# Patient Record
Sex: Female | Born: 1945 | Race: White | Hispanic: No | Marital: Married | Smoking: Former smoker
Health system: Southern US, Community
[De-identification: ages and names within clinical notes are randomized; demographics above are authoritative.]

## PROBLEM LIST (undated history)

## (undated) DIAGNOSIS — Z9889 Other specified postprocedural states: Secondary | ICD-10-CM

## (undated) DIAGNOSIS — E538 Deficiency of other specified B group vitamins: Secondary | ICD-10-CM

## (undated) DIAGNOSIS — Z8619 Personal history of other infectious and parasitic diseases: Secondary | ICD-10-CM

## (undated) DIAGNOSIS — J45909 Unspecified asthma, uncomplicated: Secondary | ICD-10-CM

## (undated) DIAGNOSIS — R7303 Prediabetes: Secondary | ICD-10-CM

## (undated) DIAGNOSIS — E559 Vitamin D deficiency, unspecified: Secondary | ICD-10-CM

## (undated) DIAGNOSIS — K219 Gastro-esophageal reflux disease without esophagitis: Secondary | ICD-10-CM

## (undated) DIAGNOSIS — D649 Anemia, unspecified: Secondary | ICD-10-CM

## (undated) DIAGNOSIS — H209 Unspecified iridocyclitis: Secondary | ICD-10-CM

## (undated) DIAGNOSIS — E039 Hypothyroidism, unspecified: Secondary | ICD-10-CM

## (undated) DIAGNOSIS — J449 Chronic obstructive pulmonary disease, unspecified: Secondary | ICD-10-CM

## (undated) DIAGNOSIS — E785 Hyperlipidemia, unspecified: Secondary | ICD-10-CM

## (undated) DIAGNOSIS — M5030 Other cervical disc degeneration, unspecified cervical region: Secondary | ICD-10-CM

## (undated) DIAGNOSIS — I10 Essential (primary) hypertension: Secondary | ICD-10-CM

## (undated) DIAGNOSIS — F419 Anxiety disorder, unspecified: Secondary | ICD-10-CM

## (undated) DIAGNOSIS — H8109 Meniere's disease, unspecified ear: Secondary | ICD-10-CM

## (undated) DIAGNOSIS — L570 Actinic keratosis: Secondary | ICD-10-CM

## (undated) DIAGNOSIS — C801 Malignant (primary) neoplasm, unspecified: Secondary | ICD-10-CM

## (undated) DIAGNOSIS — J9811 Atelectasis: Secondary | ICD-10-CM

## (undated) DIAGNOSIS — I499 Cardiac arrhythmia, unspecified: Secondary | ICD-10-CM

## (undated) DIAGNOSIS — R112 Nausea with vomiting, unspecified: Secondary | ICD-10-CM

## (undated) HISTORY — PX: ABDOMINAL HYSTERECTOMY: SHX81

## (undated) HISTORY — PX: MASTECTOMY: SHX3

## (undated) HISTORY — PX: BACK SURGERY: SHX140

## (undated) HISTORY — DX: Actinic keratosis: L57.0

## (undated) HISTORY — PX: TONSILLECTOMY: SUR1361

## (undated) HISTORY — PX: CHOLECYSTECTOMY: SHX55

## (undated) HISTORY — PX: BREAST CYST ASPIRATION: SHX578

## (undated) HISTORY — PX: LAMINECTOMY AND MICRODISCECTOMY LUMBAR SPINE: SHX1913

## (undated) HISTORY — PX: PILONIDAL CYST DRAINAGE: SHX743

---

## 2004-09-11 ENCOUNTER — Ambulatory Visit: Payer: Self-pay | Admitting: General Surgery

## 2005-09-27 ENCOUNTER — Ambulatory Visit: Payer: Self-pay

## 2005-10-05 ENCOUNTER — Ambulatory Visit: Payer: Self-pay | Admitting: General Surgery

## 2005-10-23 ENCOUNTER — Ambulatory Visit: Payer: Self-pay | Admitting: Obstetrics and Gynecology

## 2005-10-26 ENCOUNTER — Ambulatory Visit: Payer: Self-pay | Admitting: Obstetrics and Gynecology

## 2006-04-29 ENCOUNTER — Ambulatory Visit: Payer: Self-pay | Admitting: General Surgery

## 2006-06-08 ENCOUNTER — Ambulatory Visit: Payer: Self-pay | Admitting: Unknown Physician Specialty

## 2006-07-08 ENCOUNTER — Other Ambulatory Visit: Payer: Self-pay

## 2006-07-29 ENCOUNTER — Inpatient Hospital Stay: Payer: Self-pay | Admitting: General Practice

## 2006-09-09 ENCOUNTER — Ambulatory Visit: Payer: Self-pay | Admitting: General Practice

## 2006-10-08 ENCOUNTER — Ambulatory Visit: Payer: Self-pay | Admitting: Internal Medicine

## 2006-10-19 ENCOUNTER — Ambulatory Visit: Payer: Self-pay | Admitting: Internal Medicine

## 2006-12-02 ENCOUNTER — Ambulatory Visit: Payer: Self-pay | Admitting: Oncology

## 2006-12-20 ENCOUNTER — Ambulatory Visit: Payer: Self-pay | Admitting: Oncology

## 2007-01-18 ENCOUNTER — Ambulatory Visit: Payer: Self-pay | Admitting: Internal Medicine

## 2007-01-21 ENCOUNTER — Ambulatory Visit: Payer: Self-pay | Admitting: Internal Medicine

## 2007-02-18 ENCOUNTER — Ambulatory Visit: Payer: Self-pay | Admitting: Internal Medicine

## 2007-03-26 ENCOUNTER — Ambulatory Visit: Payer: Self-pay | Admitting: General Surgery

## 2007-04-20 ENCOUNTER — Ambulatory Visit: Payer: Self-pay | Admitting: Internal Medicine

## 2007-04-22 ENCOUNTER — Ambulatory Visit: Payer: Self-pay | Admitting: Internal Medicine

## 2007-04-24 ENCOUNTER — Ambulatory Visit: Payer: Self-pay | Admitting: Unknown Physician Specialty

## 2007-05-17 ENCOUNTER — Ambulatory Visit: Payer: Self-pay | Admitting: Unknown Physician Specialty

## 2007-05-20 ENCOUNTER — Ambulatory Visit: Payer: Self-pay | Admitting: Internal Medicine

## 2007-05-27 ENCOUNTER — Ambulatory Visit: Payer: Self-pay | Admitting: Internal Medicine

## 2007-06-20 ENCOUNTER — Ambulatory Visit: Payer: Self-pay | Admitting: Internal Medicine

## 2007-07-16 ENCOUNTER — Ambulatory Visit: Payer: Self-pay | Admitting: Internal Medicine

## 2007-07-21 ENCOUNTER — Ambulatory Visit: Payer: Self-pay | Admitting: Internal Medicine

## 2007-09-11 ENCOUNTER — Ambulatory Visit: Payer: Self-pay | Admitting: Internal Medicine

## 2007-09-20 ENCOUNTER — Ambulatory Visit: Payer: Self-pay | Admitting: Internal Medicine

## 2007-10-20 ENCOUNTER — Ambulatory Visit: Payer: Self-pay | Admitting: Internal Medicine

## 2007-12-21 ENCOUNTER — Ambulatory Visit: Payer: Self-pay | Admitting: Internal Medicine

## 2008-01-08 ENCOUNTER — Ambulatory Visit: Payer: Self-pay | Admitting: Internal Medicine

## 2008-01-18 ENCOUNTER — Ambulatory Visit: Payer: Self-pay | Admitting: Internal Medicine

## 2008-02-18 ENCOUNTER — Ambulatory Visit: Payer: Self-pay | Admitting: Internal Medicine

## 2008-03-19 ENCOUNTER — Ambulatory Visit: Payer: Self-pay | Admitting: Internal Medicine

## 2008-03-30 ENCOUNTER — Ambulatory Visit: Payer: Self-pay | Admitting: General Surgery

## 2008-04-02 ENCOUNTER — Ambulatory Visit: Payer: Self-pay | Admitting: Internal Medicine

## 2008-04-19 ENCOUNTER — Ambulatory Visit: Payer: Self-pay | Admitting: Internal Medicine

## 2008-06-19 ENCOUNTER — Ambulatory Visit: Payer: Self-pay | Admitting: Internal Medicine

## 2008-07-13 ENCOUNTER — Ambulatory Visit: Payer: Self-pay | Admitting: Internal Medicine

## 2008-07-20 ENCOUNTER — Ambulatory Visit: Payer: Self-pay | Admitting: Internal Medicine

## 2008-09-19 ENCOUNTER — Ambulatory Visit: Payer: Self-pay | Admitting: Internal Medicine

## 2008-10-12 ENCOUNTER — Ambulatory Visit: Payer: Self-pay | Admitting: Internal Medicine

## 2008-10-19 ENCOUNTER — Ambulatory Visit: Payer: Self-pay | Admitting: Internal Medicine

## 2008-12-20 ENCOUNTER — Ambulatory Visit: Payer: Self-pay | Admitting: Internal Medicine

## 2009-01-04 ENCOUNTER — Ambulatory Visit: Payer: Self-pay | Admitting: Internal Medicine

## 2009-01-12 ENCOUNTER — Ambulatory Visit: Payer: Self-pay | Admitting: General Practice

## 2009-01-17 ENCOUNTER — Ambulatory Visit: Payer: Self-pay | Admitting: Internal Medicine

## 2009-01-21 ENCOUNTER — Ambulatory Visit: Payer: Self-pay | Admitting: General Practice

## 2009-02-01 ENCOUNTER — Ambulatory Visit: Payer: Self-pay | Admitting: General Practice

## 2009-03-31 ENCOUNTER — Ambulatory Visit: Payer: Self-pay | Admitting: Obstetrics and Gynecology

## 2010-04-04 ENCOUNTER — Ambulatory Visit: Payer: Self-pay | Admitting: Obstetrics and Gynecology

## 2011-06-26 ENCOUNTER — Ambulatory Visit: Payer: Self-pay | Admitting: Obstetrics and Gynecology

## 2011-07-03 ENCOUNTER — Ambulatory Visit: Payer: Self-pay | Admitting: Obstetrics and Gynecology

## 2011-08-06 ENCOUNTER — Ambulatory Visit: Payer: Self-pay | Admitting: Unknown Physician Specialty

## 2011-10-24 ENCOUNTER — Ambulatory Visit: Payer: Self-pay

## 2011-12-13 ENCOUNTER — Ambulatory Visit: Payer: Self-pay | Admitting: General Practice

## 2011-12-19 ENCOUNTER — Ambulatory Visit: Payer: Self-pay | Admitting: General Practice

## 2012-06-26 ENCOUNTER — Ambulatory Visit: Payer: Self-pay | Admitting: Obstetrics and Gynecology

## 2012-11-19 HISTORY — PX: JOINT REPLACEMENT: SHX530

## 2012-12-02 ENCOUNTER — Ambulatory Visit: Payer: Self-pay | Admitting: General Practice

## 2012-12-02 LAB — CBC
HCT: 36 % (ref 35.0–47.0)
MCH: 30.3 pg (ref 26.0–34.0)
MCHC: 33.1 g/dL (ref 32.0–36.0)
RBC: 3.93 10*6/uL (ref 3.80–5.20)
WBC: 6.6 10*3/uL (ref 3.6–11.0)

## 2012-12-02 LAB — PROTIME-INR
INR: 0.9
Prothrombin Time: 13 secs (ref 11.5–14.7)

## 2012-12-02 LAB — SEDIMENTATION RATE: Erythrocyte Sed Rate: 5 mm/hr (ref 0–30)

## 2012-12-02 LAB — APTT: Activated PTT: 27.8 secs (ref 23.6–35.9)

## 2012-12-02 LAB — URINALYSIS, COMPLETE
Bilirubin,UR: NEGATIVE
Glucose,UR: NEGATIVE mg/dL (ref 0–75)
Ketone: NEGATIVE
Nitrite: NEGATIVE
Ph: 5 (ref 4.5–8.0)
Protein: NEGATIVE
WBC UR: NONE SEEN /HPF (ref 0–5)

## 2012-12-02 LAB — BASIC METABOLIC PANEL
Chloride: 106 mmol/L (ref 98–107)
Co2: 28 mmol/L (ref 21–32)
EGFR (African American): >60
Glucose: 71 mg/dL (ref 65–99)
Osmolality: 280 (ref 275–301)
Potassium: 4.4 mmol/L (ref 3.5–5.1)
Sodium: 138 mmol/L (ref 136–145)

## 2012-12-03 LAB — URINE CULTURE

## 2012-12-15 ENCOUNTER — Inpatient Hospital Stay: Payer: Self-pay | Admitting: General Practice

## 2012-12-16 LAB — BASIC METABOLIC PANEL
Anion Gap: 7 (ref 7–16)
Chloride: 109 mmol/L — ABNORMAL HIGH (ref 98–107)
Creatinine: 0.97 mg/dL (ref 0.60–1.30)
EGFR (African American): >60
EGFR (Non-African Amer.): >60
Osmolality: 280 (ref 275–301)
Potassium: 4.2 mmol/L (ref 3.5–5.1)
Sodium: 139 mmol/L (ref 136–145)

## 2012-12-16 LAB — HEMOGLOBIN: HGB: 10.6 g/dL — ABNORMAL LOW (ref 12.0–16.0)

## 2012-12-17 LAB — BASIC METABOLIC PANEL
BUN: 13 mg/dL (ref 7–18)
Co2: 24 mmol/L (ref 21–32)
EGFR (African American): >60
EGFR (Non-African Amer.): >60
Osmolality: 289 (ref 275–301)
Potassium: 3.8 mmol/L (ref 3.5–5.1)

## 2012-12-17 LAB — HEMOGLOBIN: HGB: 9.6 g/dL — ABNORMAL LOW (ref 12.0–16.0)

## 2013-07-13 ENCOUNTER — Ambulatory Visit: Payer: Self-pay | Admitting: Obstetrics and Gynecology

## 2013-12-25 ENCOUNTER — Ambulatory Visit: Payer: Self-pay | Admitting: Specialist

## 2014-09-15 DIAGNOSIS — E538 Deficiency of other specified B group vitamins: Secondary | ICD-10-CM | POA: Insufficient documentation

## 2014-10-08 ENCOUNTER — Ambulatory Visit: Payer: Self-pay | Admitting: Internal Medicine

## 2014-11-02 DIAGNOSIS — E7800 Pure hypercholesterolemia, unspecified: Secondary | ICD-10-CM | POA: Insufficient documentation

## 2014-11-02 DIAGNOSIS — I10 Essential (primary) hypertension: Secondary | ICD-10-CM | POA: Insufficient documentation

## 2015-01-25 DIAGNOSIS — M5416 Radiculopathy, lumbar region: Secondary | ICD-10-CM | POA: Insufficient documentation

## 2015-01-25 DIAGNOSIS — M7061 Trochanteric bursitis, right hip: Secondary | ICD-10-CM | POA: Insufficient documentation

## 2015-02-11 ENCOUNTER — Ambulatory Visit: Payer: Self-pay | Admitting: General Practice

## 2015-03-11 NOTE — Discharge Summary (Signed)
PATIENT NAME:  Katelyn Ball, Katelyn Ball MR#:  [id number] DATE OF BIRTH:  01/31/1946  DATE OF ADMISSION:  12/15/2012 DATE OF DISCHARGE: 12/17/2012   ADMITTING DIAGNOSIS: Degenerative arthrosis of the right knee.   DISCHARGE DIAGNOSIS: Degenerative arthrosis of the right knee.   HISTORY: The patient is a 69 year old former employee of [employer], who has been experiencing pain to the right knee for quite some time. Her pain was noted be worse with weight-bearing activities. At the time of surgery, she was not using any ambulatory aid. She had not seen any significant improvement in her condition following cortisone injections as well as Synvisc injections. She had localized most of the pain along the medial aspect of the knee. She denied any gross locking or swelling of the knee. Her knee pain had increased, which was affecting her gait pattern and subsequently is causing recurrence of her lower back pain. The pain had progressed to the point that it was significantly interfering with her activities of daily living. The patient did have a positive MRSA nasal swab prior to surgery.   HOSPITAL COURSE PROCEDURE: Right total knee arthroplasty using computer-assisted navigation.   ANESTHESIA: Femoral nerve block with general.   SOFT TISSUE RELEASE: Anterior cruciate ligament, posterior cruciate ligament, deep medial collateral ligaments as well as the patellofemoral ligament.   IMPLANTS UTILIZED: DePuy PFC Sigma size 3 posterior stabilized femoral component (cemented), size 3 MBT tibial component (cemented), 32 mm 3-pegged oval dome patella (cemented), and a 10 mm stabilized rotating platform polyethylene insert.   X-rays taken of the right knee at [facility] showed severe degenerative changes in a tricompartmental fashion with relative varus deformity.   The patient tolerated the procedure very well. She had no complications. She was then taken to the PACU where she was stabilized and then  transferred to the orthopedic floor. She began receiving anticoagulation therapy of Lovenox 30 mg subcutaneous every 12 hours per anesthesia and pharmacy protocol. She was fitted with TED stockings bilaterally. These were allowed to be removed 1 hour per 8 hour shift. The right one was applied on day 2 following removal of the Hemovac and dressing change. The wound was free of any drainage or any signs of infection. The Polar Care was reapplied to the surgical leg maintaining a temperature of 40 to 50 degrees Fahrenheit. The patient was also fitted with AVI compression foot pumps bilaterally set at 80 mmHg. Her calves have been nontender. There has been no evidence of any DVT to the lower extremity. Heels were elevated off the bed using rolled towels.   The patient has denied any chest pain or shortness of breath. Vital signs have been stable. She has been afebrile. Hemodynamically she was stable. No transfusions were given other than the Autovac transfusion given the first 6 hours postoperatively.   Physical therapy was initiated on day 1 for gait training and transfers. She has done extremely well. Upon being discharged, she was independent with bed to chair transfers. She was able go up and down 4 sets of steps. She was able to ambulate greater than 200 feet. Occupational therapy was also initiated on day 1 for ADLs and assistive devices.   The patient's IV, Foley and Hemovac were all discontinued on day 2.   DISPOSITION: The patient is being discharged to home in improved stable condition.   DISCHARGE INSTRUCTIONS: She will continue wearing TED stockings bilaterally. These are to be worn during the day, but may be removed at night. Continue using a  walker to further physical therapy to go to a quad cane. She will receive home health PT. She is to continue with Polar Care maintaining a temperature of 40 to 50 degrees Fahrenheit as much as she can 24 hours a day for the first 2 weeks. She has a followup  appointment on February 11 at 8:15.  She is to call the clinic sooner if any temperatures of 101.5 or greater or excessive bleeding.  She may weight bear as tolerated. She is to resume her regular medication that she was on prior to admission. She is placed on a regular diet. She was given a prescription for Lovenox 40 mg subcutaneously daily for 14 days, then discontinue and begin taking one 81 mg enteric-coated aspirin. No pain medicine was given secondary to fact she was unable to take these. She is to continue with the Tylenol for pain.   DRUG ALLERGIES: CIPRO, CYMBALTA, DEMEROL, HCL, FENOFIBRATE, FIORINAL, FISH OIL, ILOSONE, MOTRIN, NAPROSYN, PERCOCET, ULTRAM AND VICODIN.   PAST MEDICAL HISTORY: Asthma, chickenpox, gastroesophageal reflux disease, lingular atelectasis versus fibrosis, colonic polyps, palpitations and Marie's disease.    ____________________________ Vance Peper, PA jrw:aw D: 12/17/2012 11:16:16 ET T: 12/17/2012 11:35:19 ET JOB#: [id number]  cc: Vance Peper, PA, <Dictator> JON WOLFE PA ELECTRONICALLY SIGNED 12/17/2012 20:44

## 2015-03-11 NOTE — Op Note (Signed)
DATE OF BIRTH:  09-14-1946  DATE OF PROCEDURE:  12/15/2012  PREOPERATIVE DIAGNOSIS:  Degenerative arthrosis of the right knee.   POSTOPERATIVE DIAGNOSIS:  Degenerative arthrosis of the right knee.   PROCEDURE PERFORMED:  Right total knee arthroplasty using computer-assisted navigation.   SURGEON:  Laurice Record. Holley Bouche., MD  ASSISTANT:  Vance Peper, PA-C (required to maintain retraction throughout the procedure).  ANESTHESIA:  Femoral nerve block and general.   ESTIMATED BLOOD LOSS:  100 mL.   FLUIDS REPLACED:  1400 mL of crystalloid.   TOURNIQUET TIME:  81 minutes.   DRAINS:  Two medium drains to Reinfusion System.   SOFT TISSUE RELEASES:  Anterior cruciate ligament, posterior cruciate ligament, deep medial collateral ligament and patellofemoral ligament.   IMPLANTS UTILIZED:  DePuy PFC Sigma size 3 posterior stabilized femoral component (cemented), size 3 MBT tibial component (cemented), 32-mm 3-peg oval dome patella (cemented) and a 10-mm stabilized rotating platform polyethylene insert.   INDICATIONS FOR SURGERY:  The patient is a 69 year old female, who has been seen for complaints of progressive right knee pain. X-rays demonstrated severe degenerative changes in tricompartmental fashion with relative varus deformity. After discussion of the risks and benefits of surgical intervention, the patient expressed her understanding of the risks and benefits, and agreed with plans for surgical intervention.   PROCEDURE IN DETAIL:  The patient was brought into the operating room, and after adequate femoral nerve block and general anesthesia was achieved, a tourniquet was placed around the patient's upper right thigh. The patient's right knee and leg were cleaned and prepped with alcohol and DuraPrep, draped in the usual sterile fashion. A "timeout" was performed as per usual protocol. The right lower extremity was exsanguinated using an Esmarch, and the tourniquet was inflated to 300 mmHg.    An anterior longitudinal incision was made, followed by a standard mid vastus approach. A moderate effusion was evacuated. The deep fibers of the medial collateral ligament were elevated in a subperiosteal fashion off the medial flare of the tibia, so as to maintain a continuous soft tissue sleeve. The patella was subluxed laterally, and the patellofemoral ligament was incised. Inspection of the knee demonstrated severe degenerative changes in tricompartmental fashion with evidence of eburnated bone noted to the medial compartment. Prominent osteophytes were debrided using a rongeur. Anterior and posterior cruciate ligaments were excised. Two 4.0-mm Schanz pins were inserted into the femur and into the tibia for attachment of the array of trackers used for computer-assisted navigation. Hip center was identified using a circumduction technique. Distal landmarks were mapped using the computer. The distal femur and proximal tibia were mapped using the computer. Distal femoral cutting guide was positioned using computer-assisted navigation, so as to achieve a 5-degree distal valgus cut. Cut was performed and verified using the computer. Distal femur was sized, and it was felt that a size 3 femoral component was appropriate. Size 3 cutting guide was positioned, and anterior cut was performed and verified using the computer. This was followed by completion of the posterior and chamfer cuts. Femoral cutting guide for a central box was then positioned, and the central box cut was performed.   Attention was then directed to the proximal tibia. Medial and lateral menisci were excised. The extramedullary tibial cutting guide was positioned using computer-assisted navigation, so as to achieve a 0-degree varus-valgus alignment and 0-degree posterior slope. Cut was performed and verified using the computer. The proximal tibia was sized, and it was felt that a size 3 tibial tray was appropriate.  Tibial and femoral trials  were inserted, followed by insertion of a 10-mm polyethylene trial. Excellent mediolateral soft tissue balancing was appreciated, both in full extension and in flexion. Finally, the patella was cut and prepared, so as to accommodate a 32-mm 3-peg oval dome patella. Patellar trial was placed, and the knee was placed through a range of motion with excellent patellar tracking appreciated.   Femoral trial was removed. Central post hole for the tibial component was reamed, followed by insertion of a keel punch. Tibial trials were then removed. The cut surfaces of bone were irrigated with copious amounts of normal saline with antibiotic solution using pulsatile lavage and then suctioned dry. Polymethyl methacrylate cement with gentamicin was prepared in the usual fashion using a vacuum mixer. Gentamicin cement was used due to the patient's positive MRSA nasal swab. Cement was applied to the cut surface of the proximal tibia, as well as along the undersurface of a size 3 MBT tibial component. The tibial component was positioned and impacted into place. Excess cement was removed using freer elevators. Cement was then applied to the cut surface of the femur, as well as along the posterior flanges of a size 3 posterior stabilized femoral component. Femoral component was positioned and impacted into place. Excess cement was removed using freer elevators. A 10-mm polyethylene trial was inserted, and the knee was brought in full extension with steady axial compression applied. Finally, cement was applied to the backside of a 32-mm 3-peg oval dome patella, and the patellar component was positioned and patellar clamp applied. Excess cement was removed using freer elevators.   After adequate curing of cement, the tourniquet was deflated after a total tourniquet time of 81 minutes. Hemostasis was achieved using electrocautery. The knee was irrigated with copious amounts of normal saline with antibiotic solution using pulsatile  lavage and then suctioned dry. The knee was inspected for any residual cement debris. Then, 20 mL of Exparel (liposomal bupivacaine) was injected along the posterior capsule, as well as along the anterior aspect of the knee. An additional 30 mL of 0.25% Marcaine with epinephrine was also injected along the posterior capsule and along the anterior aspect of the knee. A 10-mm stabilized rotating platform polyethylene insert was inserted, and the knee was placed through a range of motion with excellent patellar tracking appreciated. Excellent mediolateral soft tissue balancing was appreciated, both in extension and in flexion. Two medium drains were placed in the wound bed and brought out through a separate stab incision to be attached to Reinfusion System. The medial parapatellar portion of the incision was reapproximated using interrupted sutures of #1 Vicryl. The subcutaneous tissue was reapproximated in layers using first #0 Vicryl, followed by #2-0 Vicryl. Skin was closed with skin staples. A sterile dressing was applied.   The patient tolerated the procedure well. She was transported to the recovery room in stable condition.    ____________________________ Laurice Record. Holley Bouche., MD jph:ms D: 12/15/2012 19:13:27 ET T: 12/15/2012 23:28:43 ET JOB#: [id number]  cc: Laurice Record. Holley Bouche., MD, <Dictator> Laurice Record Holley Bouche MD ELECTRONICALLY SIGNED 12/16/2012 23:41

## 2015-03-13 NOTE — Op Note (Signed)
PATIENT NAME:  Katelyn Ball, BERLING MR#:  [id number] DATE OF BIRTH:  02-Dec-1945  DATE OF PROCEDURE:  12/19/2011  PREOPERATIVE DIAGNOSIS: Internal derangement of the right knee.   POSTOPERATIVE DIAGNOSES:  1. Tear of the posterior horn medial meniscus, right knee.  2. 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   PROCEDURES PERFORMED:  1. Right knee arthroscopy.  2. Partial medial meniscectomy.  3. Chondroplasty of the medial and patellofemoral compartments.   SURGEON:  Laurice Record. Holley Bouche., MD   ANESTHESIA: General.   ESTIMATED BLOOD LOSS: Minimal.   TOURNIQUET TIME: Not used.   DRAINS: None.   INDICATIONS FOR SURGERY: The patient is a 69 year old female who has been seen for complaints of progressive right knee pain. X-rays demonstrated some mild degenerative changes, and MRI demonstrated findings consistent with meniscal pathology. After discussion of the risks and benefits of surgical intervention, the patient expressed her understanding of the risks and benefits and agreed with plans for surgical intervention.   PROCEDURE IN DETAIL: The patient was brought into the Operating Room, and after adequate general anesthesia was achieved, a tourniquet was placed on the patient's right lower thigh and the leg was placed in a leg holder. All bony prominences were well padded. The patient's right knee and leg were cleaned and prepped with alcohol and DuraPrep and draped in the usual sterile fashion. A "timeout" was performed as per usual protocol. The anticipated portal sites were injected with 0.25% Marcaine with epinephrine. An anterolateral portal was created and a cannula was inserted. A small effusion was evacuated. The scope was inserted, and the knee was distended with fluid using the DePuy Mitek pump. The scope was advanced down the medial gutter into the medial compartment of the knee. Under visualization with  the scope, an anteromedial portal was created and a hook probe was inserted. Inspection of the medial compartment demonstrated a degenerative complex tear of the posterior horn of the medial meniscus. This was debrided and contoured using meniscal punches and a 4.5 mm shaver. Final contouring was performed using the 50-degree ArthroCare wand. There were changes of grade IV chondromalacia involving the medial tibial plateau and medial femoral condyle. These areas were debrided and contoured using the ArthroCare wand. The anterior horn of the medial meniscus was visualized and probed and felt to be stable. The scope was then advanced into the intercondylar region. The anterior cruciate ligament was visualized and probed and felt to be stable. The scope was removed from the anterolateral portal and reinserted via the anteromedial portal so as to better visualize the lateral compartment. The articular surface was in excellent condition. The lateral meniscus was visualized and probed and felt to be stable. Finally, the scope was positioned so as to visualize the patellofemoral articulation. A moderate amount of synovitis was encountered laterally, and this was debrided using a combination of the 4.5 mm incisor shaver and the 50-degree ArthroCare wand. Reasonably good patellar tracking was noted. Grade 3 changes of chondromalacia were noted to the patellofemoral articulation. These areas were debrided and contoured using the ArthroCare wand. The knee was irrigated with copious amounts of fluid and then suctioned dry. The anterolateral portal was reapproximated using 3-0 nylon. A combination of 0.25% Marcaine with epinephrine and 4 mg of morphine was injected via the scope. The scope was removed, and the anteromedial portal was reapproximated using 3-0 nylon. A sterile dressing was applied, followed by application of an ice wrap.   The  patient tolerated the procedure well. She was transported to the recovery room in  stable condition.   ____________________________ Laurice Record. Holley Bouche., MD jph:cbb D: 12/19/2011 15:27:12 ET T: 12/19/2011 16:06:37 ET JOB#: [id number]  cc: Jeneen Rinks P. Holley Bouche., MD, <Dictator> JAMES P Holley Bouche MD ELECTRONICALLY SIGNED 12/23/2011 16:03

## 2015-09-05 ENCOUNTER — Other Ambulatory Visit: Payer: Self-pay | Admitting: Physician Assistant

## 2015-09-05 DIAGNOSIS — R1032 Left lower quadrant pain: Principal | ICD-10-CM

## 2015-09-05 DIAGNOSIS — R1031 Right lower quadrant pain: Secondary | ICD-10-CM

## 2015-09-05 DIAGNOSIS — R197 Diarrhea, unspecified: Secondary | ICD-10-CM

## 2015-09-05 DIAGNOSIS — R110 Nausea: Secondary | ICD-10-CM

## 2015-09-08 ENCOUNTER — Ambulatory Visit
Admission: RE | Admit: 2015-09-08 | Discharge: 2015-09-08 | Disposition: A | Discharge status: Home / Self Care | Payer: PPO | Source: Ambulatory Visit | Attending: Physician Assistant | Admitting: Physician Assistant

## 2015-09-08 DIAGNOSIS — R197 Diarrhea, unspecified: Secondary | ICD-10-CM | POA: Diagnosis present

## 2015-09-08 DIAGNOSIS — K5730 Diverticulosis of large intestine without perforation or abscess without bleeding: Secondary | ICD-10-CM | POA: Diagnosis not present

## 2015-09-08 DIAGNOSIS — M5136 Other intervertebral disc degeneration, lumbar region: Secondary | ICD-10-CM | POA: Diagnosis not present

## 2015-09-08 DIAGNOSIS — R1030 Lower abdominal pain, unspecified: Secondary | ICD-10-CM | POA: Insufficient documentation

## 2015-09-08 DIAGNOSIS — R110 Nausea: Secondary | ICD-10-CM

## 2015-09-08 DIAGNOSIS — R1031 Right lower quadrant pain: Secondary | ICD-10-CM

## 2015-09-08 DIAGNOSIS — R1032 Left lower quadrant pain: Secondary | ICD-10-CM

## 2015-09-08 HISTORY — DX: Essential (primary) hypertension: I10

## 2015-09-08 HISTORY — DX: Unspecified asthma, uncomplicated: J45.909

## 2015-09-08 MED ORDER — IOHEXOL 300 MG/ML  SOLN
100.0000 mL | Freq: Once | INTRAMUSCULAR | Status: AC | PRN
  Administered 2015-09-08: 100 mL via INTRAVENOUS

## 2015-10-04 ENCOUNTER — Encounter: Payer: Self-pay | Admitting: *Deleted

## 2015-10-05 ENCOUNTER — Ambulatory Visit: Payer: PPO | Admitting: Anesthesiology

## 2015-10-05 ENCOUNTER — Ambulatory Visit
Admission: RE | Admit: 2015-10-05 | Discharge: 2015-10-05 | Disposition: A | Discharge status: Home / Self Care | Payer: PPO | Source: Ambulatory Visit | Attending: Unknown Physician Specialty | Admitting: Unknown Physician Specialty

## 2015-10-05 ENCOUNTER — Encounter
Admission: RE | Disposition: A | Discharge status: Home / Self Care | Payer: Self-pay | Source: Ambulatory Visit | Attending: Unknown Physician Specialty

## 2015-10-05 ENCOUNTER — Encounter: Payer: Self-pay | Admitting: Anesthesiology

## 2015-10-05 DIAGNOSIS — K449 Diaphragmatic hernia without obstruction or gangrene: Secondary | ICD-10-CM | POA: Diagnosis not present

## 2015-10-05 DIAGNOSIS — M5030 Other cervical disc degeneration, unspecified cervical region: Secondary | ICD-10-CM | POA: Insufficient documentation

## 2015-10-05 DIAGNOSIS — K640 First degree hemorrhoids: Secondary | ICD-10-CM | POA: Diagnosis not present

## 2015-10-05 DIAGNOSIS — E538 Deficiency of other specified B group vitamins: Secondary | ICD-10-CM | POA: Insufficient documentation

## 2015-10-05 DIAGNOSIS — R1031 Right lower quadrant pain: Secondary | ICD-10-CM | POA: Diagnosis not present

## 2015-10-05 DIAGNOSIS — J45909 Unspecified asthma, uncomplicated: Secondary | ICD-10-CM | POA: Diagnosis not present

## 2015-10-05 DIAGNOSIS — J449 Chronic obstructive pulmonary disease, unspecified: Secondary | ICD-10-CM | POA: Diagnosis not present

## 2015-10-05 DIAGNOSIS — K219 Gastro-esophageal reflux disease without esophagitis: Secondary | ICD-10-CM | POA: Insufficient documentation

## 2015-10-05 DIAGNOSIS — R197 Diarrhea, unspecified: Secondary | ICD-10-CM | POA: Diagnosis not present

## 2015-10-05 DIAGNOSIS — K5730 Diverticulosis of large intestine without perforation or abscess without bleeding: Secondary | ICD-10-CM | POA: Insufficient documentation

## 2015-10-05 DIAGNOSIS — Z79899 Other long term (current) drug therapy: Secondary | ICD-10-CM | POA: Diagnosis not present

## 2015-10-05 DIAGNOSIS — Z87891 Personal history of nicotine dependence: Secondary | ICD-10-CM | POA: Insufficient documentation

## 2015-10-05 DIAGNOSIS — E785 Hyperlipidemia, unspecified: Secondary | ICD-10-CM | POA: Insufficient documentation

## 2015-10-05 DIAGNOSIS — I10 Essential (primary) hypertension: Secondary | ICD-10-CM | POA: Diagnosis not present

## 2015-10-05 DIAGNOSIS — R1013 Epigastric pain: Secondary | ICD-10-CM | POA: Diagnosis not present

## 2015-10-05 DIAGNOSIS — R1032 Left lower quadrant pain: Secondary | ICD-10-CM | POA: Diagnosis present

## 2015-10-05 DIAGNOSIS — K2950 Unspecified chronic gastritis without bleeding: Secondary | ICD-10-CM | POA: Diagnosis not present

## 2015-10-05 DIAGNOSIS — E559 Vitamin D deficiency, unspecified: Secondary | ICD-10-CM | POA: Insufficient documentation

## 2015-10-05 HISTORY — DX: Chronic obstructive pulmonary disease, unspecified: J44.9

## 2015-10-05 HISTORY — PX: ESOPHAGOGASTRODUODENOSCOPY (EGD) WITH PROPOFOL: SHX5813

## 2015-10-05 HISTORY — DX: Other cervical disc degeneration, unspecified cervical region: M50.30

## 2015-10-05 HISTORY — DX: Cardiac arrhythmia, unspecified: I49.9

## 2015-10-05 HISTORY — DX: Anemia, unspecified: D64.9

## 2015-10-05 HISTORY — DX: Vitamin D deficiency, unspecified: E55.9

## 2015-10-05 HISTORY — DX: Personal history of other infectious and parasitic diseases: Z86.19

## 2015-10-05 HISTORY — DX: Gastro-esophageal reflux disease without esophagitis: K21.9

## 2015-10-05 HISTORY — DX: Deficiency of other specified B group vitamins: E53.8

## 2015-10-05 HISTORY — PX: COLONOSCOPY WITH PROPOFOL: SHX5780

## 2015-10-05 HISTORY — DX: Unspecified iridocyclitis: H20.9

## 2015-10-05 HISTORY — DX: Atelectasis: J98.11

## 2015-10-05 HISTORY — DX: Meniere's disease, unspecified ear: H81.09

## 2015-10-05 HISTORY — DX: Hyperlipidemia, unspecified: E78.5

## 2015-10-05 SURGERY — COLONOSCOPY WITH PROPOFOL
Anesthesia: General

## 2015-10-05 MED ORDER — PROPOFOL 500 MG/50ML IV EMUL
INTRAVENOUS | Status: DC | PRN
  Administered 2015-10-05: 120 ug/kg/min via INTRAVENOUS

## 2015-10-05 MED ORDER — PROPOFOL 10 MG/ML IV BOLUS
INTRAVENOUS | Status: DC | PRN
  Administered 2015-10-05: 50 mg via INTRAVENOUS
  Administered 2015-10-05: 20 mg via INTRAVENOUS
  Administered 2015-10-05: 50 mg via INTRAVENOUS

## 2015-10-05 MED ORDER — SODIUM CHLORIDE 0.9 % IV SOLN
INTRAVENOUS | Status: DC | PRN
  Administered 2015-10-05: 13:00:00 via INTRAVENOUS

## 2015-10-05 MED ORDER — SODIUM CHLORIDE 0.9 % IV SOLN
INTRAVENOUS | Status: DC
  Administered 2015-10-05: 13:00:00 via INTRAVENOUS

## 2015-10-05 MED ORDER — FENTANYL CITRATE (PF) 100 MCG/2ML IJ SOLN: 25.0000 ug | INTRAMUSCULAR | Status: DC | PRN

## 2015-10-05 MED ORDER — SODIUM CHLORIDE 0.9 % IV SOLN: INTRAVENOUS | Status: DC

## 2015-10-05 MED ORDER — ONDANSETRON HCL 4 MG/2ML IJ SOLN: 4.0000 mg | Freq: Once | INTRAMUSCULAR | Status: DC | PRN

## 2015-10-05 NOTE — Transfer of Care (Signed)
Immediate Anesthesia Transfer of Care Note  Patient: Katelyn Ball  Procedure(s) Performed: Procedure(s): COLONOSCOPY WITH PROPOFOL (N/A) ESOPHAGOGASTRODUODENOSCOPY (EGD) WITH PROPOFOL (N/A)  Patient Location: PACU  Anesthesia Type:General  Level of Consciousness: sedated  Airway & Oxygen Therapy: Patient Spontanous Breathing and Patient connected to nasal cannula oxygen  Post-op Assessment: Report given to RN and Post -op Vital signs reviewed and stable  Post vital signs: stable  Last Vitals:  Filed Vitals:   10/05/15 1257  Pulse: 68  Temp: 36.9 C  Resp: 20    Complications: No apparent anesthesia complications

## 2015-10-05 NOTE — Anesthesia Preprocedure Evaluation (Addendum)
Anesthesia Evaluation  Patient identified by MRN, date of birth, ID band Patient awake    Reviewed: Allergy & Precautions, NPO status , Patient's Chart, lab work & pertinent test results, reviewed documented beta blocker date and time   Airway Mallampati: II  TM Distance: >3 FB     Dental no notable dental hx.    Pulmonary asthma , COPD,  COPD inhaler, former smoker,    Pulmonary exam normal        Cardiovascular hypertension, Pt. on medications and Pt. on home beta blockers Normal cardiovascular exam+ dysrhythmias      Neuro/Psych negative neurological ROS  negative psych ROS   GI/Hepatic Neg liver ROS, GERD  Medicated and Controlled,  Endo/Other  negative endocrine ROS  Renal/GU negative Renal ROS  negative genitourinary   Musculoskeletal  (+) Arthritis , Osteoarthritis,    Abdominal Normal abdominal exam  (+)   Peds negative pediatric ROS (+)  Hematology  (+) anemia ,   Anesthesia Other Findings meniere's disease, Vitamin D deficiency, iritis  Reproductive/Obstetrics                           Anesthesia Physical Anesthesia Plan  ASA: III  Anesthesia Plan: General   Post-op Pain Management:    Induction: Intravenous  Airway Management Planned: Nasal Cannula  Additional Equipment:   Intra-op Plan:   Post-operative Plan:   Informed Consent: I have reviewed the patients History and Physical, chart, labs and discussed the procedure including the risks, benefits and alternatives for the proposed anesthesia with the patient or authorized representative who has indicated his/her understanding and acceptance.   Dental advisory given  Plan Discussed with: CRNA and Surgeon  Anesthesia Plan Comments:         Anesthesia Quick Evaluation

## 2015-10-05 NOTE — Op Note (Signed)
[facility] Gastroenterology Patient Name: Katelyn Ball Procedure Date: 10/05/2015 1:21 PM MRN: [id number] Account #: [id number] Date of Birth: 09/03/1946 Admit Type: Inpatient Age: 69 Room: [facility] ENDO ROOM 3 Gender: Female Note Status: Finalized Procedure:         Upper GI endoscopy Indications:       Epigastric abdominal pain, Heartburn Providers:         Manya Silvas, MD Medicines:         Propofol per Anesthesia Complications:     No immediate complications. Procedure:         Pre-Anesthesia Assessment:                    - After reviewing the risks and benefits, the patient was                     deemed in satisfactory condition to undergo the procedure.                    After obtaining informed consent, the endoscope was passed                     under direct vision. Throughout the procedure, the                     patient's blood pressure, pulse, and oxygen saturations                     were monitored continuously. The Endoscope was introduced                     through the mouth, and advanced to the second part of                     duodenum. The upper GI endoscopy was accomplished without                     difficulty. The patient tolerated the procedure well. Findings:      The examined esophagus was normal.      A small hiatus hernia was present. GEJ at 37cm 2cm hiatal hernia.      Localized mild inflammation characterized by erythema and granularity       was found in the prepyloric region of the stomach. Biopsies were taken       with a cold forceps for Helicobacter pylori testing. Biopsies were taken       with a cold forceps for histology.      The examined duodenum was normal. Impression:        - Normal esophagus.                    - Small hiatus hernia.                    - Gastritis. Biopsied.                    - Normal examined duodenum. Recommendation:    - Await pathology results. Manya Silvas, MD 10/05/2015  1:32:57 PM This report has been signed electronically. Number of Addenda: 0 Note Initiated On: 10/05/2015 1:21 PM      [facility]

## 2015-10-05 NOTE — Op Note (Signed)
[facility]astroenterology Patient Name: Katelyn Ball Procedure Date: 10/05/2015 1:33 PM MRN: [id number] Account #: [id number] Date of Birth: [birth year] Admit Type: Outpatient Age: 69 Room: [facility] ENDO ROOM 3 Gender: Female Note Status: Finalized Procedure:         Colonoscopy Indications:       Abdominal pain in the left lower quadrant, Abdominal pain                     in the right lower quadrant, Clinically significant                     diarrhea of unexplained origin, Abnormal CT of the GI tract Providers:         Manya Silvas, MD Medicines:         Propofol per Anesthesia Complications:     No immediate complications. Procedure:         Pre-Anesthesia Assessment:                    - After reviewing the risks and benefits, the patient was                     deemed in satisfactory condition to undergo the procedure.                    After obtaining informed consent, the colonoscope was                     passed under direct vision. Throughout the procedure, the                     patient's blood pressure, pulse, and oxygen saturations                     were monitored continuously. The Colonoscope was                     introduced through the anus and advanced to the the                     terminal ileum. The colonoscopy was performed without                     difficulty. The patient tolerated the procedure well. The                     quality of the bowel preparation was excellent. Findings:      A 12 mm polypoid lesion was found at the ileocecal valve. The lesion was       polypoid. No bleeding was present. Biopsies were taken with a cold       forceps for histology. The polyp wasa covered by norrmal small bowel       mucosa.      Many small-mouthed diverticula were found in the sigmoid colon.      Internal hemorrhoids were found during endoscopy. The hemorrhoids were       small and Grade I (internal hemorrhoids that do not  prolapse).      Due to diarrhea biopsies done of transverse, descending and sigmoid       colon.. Impression:        - Likely benign polypoid lesion at the ileocecal valve.  Biopsied.                    - Diverticulosis in the sigmoid colon.                    - Internal hemorrhoids. Recommendation:    - Await pathology results. Try Levsin for abd cramps. Manya Silvas, MD 10/05/2015 2:05:37 PM This report has been signed electronically. Number of Addenda: 0 Note Initiated On: 10/05/2015 1:33 PM Scope Withdrawal Time: 0 hours 12 minutes 44 seconds  Total Procedure Duration: 0 hours 21 minutes 39 seconds       [facility]

## 2015-10-05 NOTE — H&P (Signed)
Primary Care Physician:  Adin Hector, MD Primary Gastroenterologist:  Dr. Vira Agar  Pre-Procedure History & Physical: HPI:  Katelyn Ball is a 69 y.o. female is here for an endoscopy and colonoscopy.   Past Medical History  Diagnosis Date  . Asthma   . Hypertension   . Anemia   . Atelectasis   . B12 deficiency   . COPD (chronic obstructive pulmonary disease) ([place])   . DDD (degenerative disc disease), cervical   . GERD (gastroesophageal reflux disease)   . History of chicken pox   . Hyperlipidemia   . Iritis   . Meniere disease   . Dysrhythmia   . Vitamin D deficiency     Past Surgical History  Procedure Laterality Date  . Abdominal hysterectomy    . Laminectomy and microdiscectomy lumbar spine    . Pilonidal cyst drainage    . Tonsillectomy    . Joint replacement Bilateral 11/2012    Prior to Admission medications   Medication Sig Start Date End Date Taking? Authorizing Provider  Calcium Carbonate-Vitamin D (CALTRATE 600+D) 600-400 MG-UNIT tablet Take 1 tablet by mouth 2 (two) times daily.   Yes Historical Provider, MD  cholecalciferol (VITAMIN D) 400 UNITS TABS tablet Take 400 Units by mouth daily.   Yes Historical Provider, MD  clonazePAM (KLONOPIN) 1 MG tablet Take 1 mg by mouth at bedtime.   Yes Historical Provider, MD  estradiol (CLIMARA - DOSED IN MG/24 HR) 0.1 mg/24hr patch Place 0.1 mg onto the skin once a week.   Yes Historical Provider, MD  fenofibrate 160 MG tablet Take 160 mg by mouth daily.   Yes Historical Provider, MD  ipratropium (ATROVENT HFA) 17 MCG/ACT inhaler Inhale 2 puffs into the lungs every 6 (six) hours as needed for wheezing.   Yes Historical Provider, MD  metoprolol succinate (TOPROL-XL) 25 MG 24 hr tablet Take 25 mg by mouth daily.   Yes Historical Provider, MD  mometasone (ASMANEX) 220 MCG/INH inhaler Inhale 1 puff into the lungs 2 (two) times daily.   Yes Historical Provider, MD  montelukast (SINGULAIR) 10 MG tablet Take 10 mg by  mouth at bedtime.   Yes Historical Provider, MD  pantoprazole (PROTONIX) 40 MG tablet Take 40 mg by mouth daily.   Yes Historical Provider, MD  PARoxetine (PAXIL) 10 MG tablet Take 10 mg by mouth daily.   Yes Historical Provider, MD  sucralfate (CARAFATE) 1 G tablet Take 1 g by mouth 4 (four) times daily -  with meals and at bedtime.   Yes Historical Provider, MD  cetirizine (ZYRTEC) 10 MG tablet Take 10 mg by mouth daily.    Historical Provider, MD  diclofenac (VOLTAREN) 75 MG EC tablet Take 75 mg by mouth 2 (two) times daily.    Historical Provider, MD  metroNIDAZOLE (FLAGYL) 500 MG tablet Take 500 mg by mouth 4 (four) times daily.    Historical Provider, MD  sulfamethoxazole-trimethoprim (BACTRIM DS,SEPTRA DS) 800-160 MG tablet Take 1 tablet by mouth 2 (two) times daily.    Historical Provider, MD    Allergies as of 09/12/2015 - never reviewed  Allergen Reaction Noted  . Ciprofloxacin  09/08/2015  . Cyclobenzaprine Nausea Only 09/08/2015  . Cymbalta [duloxetine hcl] Nausea Only 09/08/2015  . Demerol [meperidine] Nausea Only 09/08/2015  . Erythromycin  09/08/2015  . Fish oil Nausea Only 09/08/2015  . Motrin [ibuprofen] Nausea Only 09/08/2015  . Naproxen Nausea Only 09/08/2015    History reviewed. No pertinent family history.  Social History   Social History  . Marital Status: Married    Spouse Name: N/A  . Number of Children: N/A  . Years of Education: N/A   Occupational History  . Not on file.   Social History Main Topics  . Smoking status: Former Smoker    Quit date: 10/03/2006  . Smokeless tobacco: Never Used  . Alcohol Use: No  . Drug Use: No  . Sexual Activity: Not on file   Other Topics Concern  . Not on file   Social History Narrative    Review of Systems: See HPI, otherwise negative ROS  Physical Exam: Pulse 68  Temp(Src) 98.4 F (36.9 C) (Oral)  Resp 20  Ht 5\' 5"  (1.651 m)  Wt 83.915 kg (185 lb)  BMI 30.79 kg/m2 General:   Alert,  pleasant and  cooperative in NAD Head:  Normocephalic and atraumatic. Neck:  Supple; no masses or thyromegaly. Lungs:  Clear throughout to auscultation.    Heart:  Regular rate and rhythm. Abdomen:  Soft, nontender and nondistended. Normal bowel sounds, without guarding, and without rebound.   Neurologic:  Alert and  oriented x4;  grossly normal neurologically.  Impression/Plan: Katelyn Ball is here for an endoscopy and colonoscopy to be performed for bilateral lower abd pain, GERD, abnormal CT scan  Risks, benefits, limitations, and alternatives regarding  endoscopy and colonoscopy have been reviewed with the patient.  Questions have been answered.  All parties agreeable.   Gaylyn Cheers, MD  10/05/2015, 1:05 PM

## 2015-10-06 ENCOUNTER — Encounter: Payer: Self-pay | Admitting: Unknown Physician Specialty

## 2015-10-07 LAB — SURGICAL PATHOLOGY

## 2015-10-10 NOTE — Anesthesia Postprocedure Evaluation (Signed)
Anesthesia Post Note  Patient: Katelyn Ball  Procedure(s) Performed: Procedure(s) (LRB): COLONOSCOPY WITH PROPOFOL (N/A) ESOPHAGOGASTRODUODENOSCOPY (EGD) WITH PROPOFOL (N/A)  Patient location during evaluation: Endoscopy Anesthesia Type: General Level of consciousness: awake, awake and alert and oriented Pain management: pain level controlled Vital Signs Assessment: post-procedure vital signs reviewed and stable Respiratory status: spontaneous breathing Cardiovascular status: blood pressure returned to baseline Anesthetic complications: no    Last Vitals:  Filed Vitals:   10/05/15 1422 10/05/15 1432  BP: 111/96 143/73  Pulse: 46 57  Temp:    Resp: 15 16    Last Pain:  Filed Vitals:   10/06/15 0801  PainSc: 0-No pain                 [name]

## 2015-10-18 ENCOUNTER — Other Ambulatory Visit: Payer: Self-pay | Admitting: Internal Medicine

## 2015-10-18 DIAGNOSIS — K5730 Diverticulosis of large intestine without perforation or abscess without bleeding: Secondary | ICD-10-CM | POA: Insufficient documentation

## 2015-10-18 DIAGNOSIS — Z1231 Encounter for screening mammogram for malignant neoplasm of breast: Secondary | ICD-10-CM

## 2015-10-27 ENCOUNTER — Other Ambulatory Visit: Payer: Self-pay | Admitting: Internal Medicine

## 2015-10-27 ENCOUNTER — Ambulatory Visit
Admission: RE | Admit: 2015-10-27 | Discharge: 2015-10-27 | Disposition: A | Discharge status: Home / Self Care | Payer: PPO | Source: Ambulatory Visit | Attending: Internal Medicine | Admitting: Internal Medicine

## 2015-10-27 DIAGNOSIS — Z1231 Encounter for screening mammogram for malignant neoplasm of breast: Secondary | ICD-10-CM | POA: Diagnosis present

## 2015-10-27 HISTORY — DX: Malignant (primary) neoplasm, unspecified: C80.1

## 2016-10-09 ENCOUNTER — Other Ambulatory Visit: Payer: Self-pay | Admitting: Internal Medicine

## 2016-10-09 DIAGNOSIS — Z1231 Encounter for screening mammogram for malignant neoplasm of breast: Secondary | ICD-10-CM

## 2016-10-18 DIAGNOSIS — F3342 Major depressive disorder, recurrent, in full remission: Secondary | ICD-10-CM | POA: Insufficient documentation

## 2016-10-30 ENCOUNTER — Ambulatory Visit
Admission: RE | Admit: 2016-10-30 | Discharge: 2016-10-30 | Disposition: A | Discharge status: Home / Self Care | Payer: Medicare PPO | Source: Ambulatory Visit | Attending: Internal Medicine | Admitting: Internal Medicine

## 2016-10-30 DIAGNOSIS — Z1231 Encounter for screening mammogram for malignant neoplasm of breast: Secondary | ICD-10-CM | POA: Diagnosis not present

## 2017-09-13 ENCOUNTER — Other Ambulatory Visit: Payer: Self-pay | Admitting: Surgery

## 2017-09-13 DIAGNOSIS — N63 Unspecified lump in unspecified breast: Secondary | ICD-10-CM

## 2017-09-16 ENCOUNTER — Ambulatory Visit
Admission: RE | Admit: 2017-09-16 | Discharge: 2017-09-16 | Disposition: A | Discharge status: Home / Self Care | Payer: Medicare PPO | Source: Ambulatory Visit | Attending: Surgery | Admitting: Surgery

## 2017-09-16 DIAGNOSIS — N63 Unspecified lump in unspecified breast: Secondary | ICD-10-CM

## 2017-09-16 DIAGNOSIS — N6001 Solitary cyst of right breast: Secondary | ICD-10-CM | POA: Diagnosis present

## 2017-09-18 ENCOUNTER — Other Ambulatory Visit: Payer: Self-pay | Admitting: Surgery

## 2017-09-18 DIAGNOSIS — N6001 Solitary cyst of right breast: Secondary | ICD-10-CM

## 2017-09-18 DIAGNOSIS — R928 Other abnormal and inconclusive findings on diagnostic imaging of breast: Secondary | ICD-10-CM

## 2017-09-25 ENCOUNTER — Ambulatory Visit
Admission: RE | Admit: 2017-09-25 | Discharge: 2017-09-25 | Disposition: A | Discharge status: Home / Self Care | Payer: Medicare PPO | Source: Ambulatory Visit | Attending: Surgery | Admitting: Surgery

## 2017-09-25 DIAGNOSIS — N6001 Solitary cyst of right breast: Secondary | ICD-10-CM | POA: Insufficient documentation

## 2017-09-25 DIAGNOSIS — R928 Other abnormal and inconclusive findings on diagnostic imaging of breast: Secondary | ICD-10-CM | POA: Insufficient documentation

## 2017-09-25 HISTORY — PX: BREAST CYST ASPIRATION: SHX578

## 2017-10-31 ENCOUNTER — Other Ambulatory Visit: Payer: Self-pay | Admitting: Specialist

## 2017-10-31 DIAGNOSIS — R053 Chronic cough: Secondary | ICD-10-CM

## 2017-10-31 DIAGNOSIS — J449 Chronic obstructive pulmonary disease, unspecified: Secondary | ICD-10-CM

## 2017-10-31 DIAGNOSIS — R05 Cough: Secondary | ICD-10-CM

## 2017-10-31 DIAGNOSIS — Z87891 Personal history of nicotine dependence: Secondary | ICD-10-CM

## 2017-11-14 ENCOUNTER — Ambulatory Visit: Payer: Medicare PPO | Attending: Specialist

## 2017-11-26 DIAGNOSIS — Z4689 Encounter for fitting and adjustment of other specified devices: Secondary | ICD-10-CM | POA: Diagnosis not present

## 2017-11-28 ENCOUNTER — Ambulatory Visit: Admission: RE | Admit: 2017-11-28 | Payer: Medicare PPO | Source: Ambulatory Visit

## 2017-11-28 ENCOUNTER — Ambulatory Visit
Admission: RE | Admit: 2017-11-28 | Discharge: 2017-11-28 | Disposition: A | Discharge status: Home / Self Care | Payer: Medicare PPO | Source: Ambulatory Visit | Attending: Specialist | Admitting: Specialist

## 2017-11-28 DIAGNOSIS — J449 Chronic obstructive pulmonary disease, unspecified: Secondary | ICD-10-CM | POA: Diagnosis not present

## 2017-11-28 DIAGNOSIS — I2510 Atherosclerotic heart disease of native coronary artery without angina pectoris: Secondary | ICD-10-CM | POA: Insufficient documentation

## 2017-11-28 DIAGNOSIS — R05 Cough: Secondary | ICD-10-CM

## 2017-11-28 DIAGNOSIS — Z87891 Personal history of nicotine dependence: Secondary | ICD-10-CM | POA: Diagnosis not present

## 2017-11-28 DIAGNOSIS — I700 Atherosclerosis of aorta: Secondary | ICD-10-CM | POA: Diagnosis not present

## 2017-11-28 DIAGNOSIS — R918 Other nonspecific abnormal finding of lung field: Secondary | ICD-10-CM | POA: Diagnosis not present

## 2017-11-28 DIAGNOSIS — R053 Chronic cough: Secondary | ICD-10-CM

## 2017-12-18 DIAGNOSIS — I2510 Atherosclerotic heart disease of native coronary artery without angina pectoris: Secondary | ICD-10-CM | POA: Insufficient documentation

## 2017-12-18 DIAGNOSIS — I700 Atherosclerosis of aorta: Secondary | ICD-10-CM | POA: Insufficient documentation

## 2017-12-31 DIAGNOSIS — Z4689 Encounter for fitting and adjustment of other specified devices: Secondary | ICD-10-CM | POA: Diagnosis not present

## 2018-01-22 DIAGNOSIS — D225 Melanocytic nevi of trunk: Secondary | ICD-10-CM | POA: Diagnosis not present

## 2018-01-22 DIAGNOSIS — L719 Rosacea, unspecified: Secondary | ICD-10-CM | POA: Diagnosis not present

## 2018-01-22 DIAGNOSIS — L905 Scar conditions and fibrosis of skin: Secondary | ICD-10-CM | POA: Diagnosis not present

## 2018-01-22 DIAGNOSIS — L570 Actinic keratosis: Secondary | ICD-10-CM | POA: Diagnosis not present

## 2018-01-22 DIAGNOSIS — D1801 Hemangioma of skin and subcutaneous tissue: Secondary | ICD-10-CM | POA: Diagnosis not present

## 2018-01-22 DIAGNOSIS — D229 Melanocytic nevi, unspecified: Secondary | ICD-10-CM | POA: Diagnosis not present

## 2018-01-22 DIAGNOSIS — Z85828 Personal history of other malignant neoplasm of skin: Secondary | ICD-10-CM | POA: Diagnosis not present

## 2018-01-22 DIAGNOSIS — L821 Other seborrheic keratosis: Secondary | ICD-10-CM | POA: Diagnosis not present

## 2018-01-22 DIAGNOSIS — L812 Freckles: Secondary | ICD-10-CM | POA: Diagnosis not present

## 2018-01-31 DIAGNOSIS — L570 Actinic keratosis: Secondary | ICD-10-CM | POA: Diagnosis not present

## 2018-02-04 DIAGNOSIS — H43812 Vitreous degeneration, left eye: Secondary | ICD-10-CM | POA: Diagnosis not present

## 2018-02-07 DIAGNOSIS — J449 Chronic obstructive pulmonary disease, unspecified: Secondary | ICD-10-CM | POA: Diagnosis not present

## 2018-03-05 DIAGNOSIS — L570 Actinic keratosis: Secondary | ICD-10-CM | POA: Diagnosis not present

## 2018-03-05 DIAGNOSIS — L814 Other melanin hyperpigmentation: Secondary | ICD-10-CM | POA: Diagnosis not present

## 2018-03-10 DIAGNOSIS — J449 Chronic obstructive pulmonary disease, unspecified: Secondary | ICD-10-CM | POA: Diagnosis not present

## 2018-04-08 DIAGNOSIS — H43812 Vitreous degeneration, left eye: Secondary | ICD-10-CM | POA: Diagnosis not present

## 2018-04-09 DIAGNOSIS — J449 Chronic obstructive pulmonary disease, unspecified: Secondary | ICD-10-CM | POA: Diagnosis not present

## 2018-04-17 DIAGNOSIS — E559 Vitamin D deficiency, unspecified: Secondary | ICD-10-CM | POA: Diagnosis not present

## 2018-04-17 DIAGNOSIS — E7800 Pure hypercholesterolemia, unspecified: Secondary | ICD-10-CM | POA: Diagnosis not present

## 2018-04-17 DIAGNOSIS — I10 Essential (primary) hypertension: Secondary | ICD-10-CM | POA: Diagnosis not present

## 2018-04-17 DIAGNOSIS — L570 Actinic keratosis: Secondary | ICD-10-CM | POA: Diagnosis not present

## 2018-04-17 DIAGNOSIS — D649 Anemia, unspecified: Secondary | ICD-10-CM | POA: Diagnosis not present

## 2018-04-17 DIAGNOSIS — L821 Other seborrheic keratosis: Secondary | ICD-10-CM | POA: Diagnosis not present

## 2018-04-17 DIAGNOSIS — M71349 Other bursal cyst, unspecified hand: Secondary | ICD-10-CM | POA: Diagnosis not present

## 2018-04-17 DIAGNOSIS — R739 Hyperglycemia, unspecified: Secondary | ICD-10-CM | POA: Diagnosis not present

## 2018-04-17 DIAGNOSIS — E538 Deficiency of other specified B group vitamins: Secondary | ICD-10-CM | POA: Diagnosis not present

## 2018-04-24 ENCOUNTER — Other Ambulatory Visit: Payer: Self-pay | Admitting: Internal Medicine

## 2018-04-24 DIAGNOSIS — K219 Gastro-esophageal reflux disease without esophagitis: Secondary | ICD-10-CM | POA: Diagnosis not present

## 2018-04-24 DIAGNOSIS — E559 Vitamin D deficiency, unspecified: Secondary | ICD-10-CM | POA: Diagnosis not present

## 2018-04-24 DIAGNOSIS — I2510 Atherosclerotic heart disease of native coronary artery without angina pectoris: Secondary | ICD-10-CM | POA: Diagnosis not present

## 2018-04-24 DIAGNOSIS — D649 Anemia, unspecified: Secondary | ICD-10-CM | POA: Diagnosis not present

## 2018-04-24 DIAGNOSIS — J438 Other emphysema: Secondary | ICD-10-CM | POA: Diagnosis not present

## 2018-04-24 DIAGNOSIS — I10 Essential (primary) hypertension: Secondary | ICD-10-CM | POA: Diagnosis not present

## 2018-04-24 DIAGNOSIS — E039 Hypothyroidism, unspecified: Secondary | ICD-10-CM | POA: Diagnosis not present

## 2018-04-24 DIAGNOSIS — R911 Solitary pulmonary nodule: Secondary | ICD-10-CM

## 2018-04-24 DIAGNOSIS — M5431 Sciatica, right side: Secondary | ICD-10-CM | POA: Diagnosis not present

## 2018-04-24 DIAGNOSIS — I700 Atherosclerosis of aorta: Secondary | ICD-10-CM | POA: Diagnosis not present

## 2018-04-24 DIAGNOSIS — E538 Deficiency of other specified B group vitamins: Secondary | ICD-10-CM | POA: Diagnosis not present

## 2018-04-25 ENCOUNTER — Other Ambulatory Visit: Payer: Self-pay | Admitting: Internal Medicine

## 2018-04-25 DIAGNOSIS — I739 Peripheral vascular disease, unspecified: Secondary | ICD-10-CM

## 2018-05-07 ENCOUNTER — Ambulatory Visit
Admission: RE | Admit: 2018-05-07 | Discharge: 2018-05-07 | Disposition: A | Discharge status: Home / Self Care | Payer: Medicare HMO | Source: Ambulatory Visit | Attending: Internal Medicine | Admitting: Internal Medicine

## 2018-05-07 DIAGNOSIS — R911 Solitary pulmonary nodule: Secondary | ICD-10-CM

## 2018-05-07 DIAGNOSIS — I6523 Occlusion and stenosis of bilateral carotid arteries: Secondary | ICD-10-CM | POA: Insufficient documentation

## 2018-05-07 DIAGNOSIS — I10 Essential (primary) hypertension: Secondary | ICD-10-CM | POA: Diagnosis not present

## 2018-05-07 DIAGNOSIS — I739 Peripheral vascular disease, unspecified: Secondary | ICD-10-CM

## 2018-05-07 DIAGNOSIS — I700 Atherosclerosis of aorta: Secondary | ICD-10-CM | POA: Diagnosis not present

## 2018-05-07 DIAGNOSIS — R918 Other nonspecific abnormal finding of lung field: Secondary | ICD-10-CM | POA: Diagnosis not present

## 2018-05-10 DIAGNOSIS — J449 Chronic obstructive pulmonary disease, unspecified: Secondary | ICD-10-CM | POA: Diagnosis not present

## 2018-05-13 DIAGNOSIS — I700 Atherosclerosis of aorta: Secondary | ICD-10-CM | POA: Diagnosis not present

## 2018-05-13 DIAGNOSIS — I2510 Atherosclerotic heart disease of native coronary artery without angina pectoris: Secondary | ICD-10-CM | POA: Diagnosis not present

## 2018-06-09 DIAGNOSIS — J449 Chronic obstructive pulmonary disease, unspecified: Secondary | ICD-10-CM | POA: Diagnosis not present

## 2018-07-07 DIAGNOSIS — M5136 Other intervertebral disc degeneration, lumbar region: Secondary | ICD-10-CM | POA: Diagnosis not present

## 2018-07-07 DIAGNOSIS — M5416 Radiculopathy, lumbar region: Secondary | ICD-10-CM | POA: Diagnosis not present

## 2018-07-07 DIAGNOSIS — M48062 Spinal stenosis, lumbar region with neurogenic claudication: Secondary | ICD-10-CM | POA: Diagnosis not present

## 2018-07-09 DIAGNOSIS — R911 Solitary pulmonary nodule: Secondary | ICD-10-CM | POA: Diagnosis not present

## 2018-07-09 DIAGNOSIS — K219 Gastro-esophageal reflux disease without esophagitis: Secondary | ICD-10-CM | POA: Diagnosis not present

## 2018-07-10 DIAGNOSIS — J449 Chronic obstructive pulmonary disease, unspecified: Secondary | ICD-10-CM | POA: Diagnosis not present

## 2018-08-05 DIAGNOSIS — M48062 Spinal stenosis, lumbar region with neurogenic claudication: Secondary | ICD-10-CM | POA: Diagnosis not present

## 2018-08-05 DIAGNOSIS — M5416 Radiculopathy, lumbar region: Secondary | ICD-10-CM | POA: Diagnosis not present

## 2018-08-05 DIAGNOSIS — M5136 Other intervertebral disc degeneration, lumbar region: Secondary | ICD-10-CM | POA: Diagnosis not present

## 2018-08-08 DIAGNOSIS — I700 Atherosclerosis of aorta: Secondary | ICD-10-CM | POA: Diagnosis not present

## 2018-08-08 DIAGNOSIS — I10 Essential (primary) hypertension: Secondary | ICD-10-CM | POA: Diagnosis not present

## 2018-08-08 DIAGNOSIS — I2510 Atherosclerotic heart disease of native coronary artery without angina pectoris: Secondary | ICD-10-CM | POA: Diagnosis not present

## 2018-08-08 DIAGNOSIS — F3342 Major depressive disorder, recurrent, in full remission: Secondary | ICD-10-CM | POA: Diagnosis not present

## 2018-08-08 DIAGNOSIS — E7800 Pure hypercholesterolemia, unspecified: Secondary | ICD-10-CM | POA: Diagnosis not present

## 2018-08-08 DIAGNOSIS — R002 Palpitations: Secondary | ICD-10-CM | POA: Diagnosis not present

## 2018-08-08 DIAGNOSIS — J438 Other emphysema: Secondary | ICD-10-CM | POA: Diagnosis not present

## 2018-08-08 DIAGNOSIS — K219 Gastro-esophageal reflux disease without esophagitis: Secondary | ICD-10-CM | POA: Diagnosis not present

## 2018-08-10 DIAGNOSIS — J449 Chronic obstructive pulmonary disease, unspecified: Secondary | ICD-10-CM | POA: Diagnosis not present

## 2018-09-09 DIAGNOSIS — J449 Chronic obstructive pulmonary disease, unspecified: Secondary | ICD-10-CM | POA: Diagnosis not present

## 2018-10-10 DIAGNOSIS — J449 Chronic obstructive pulmonary disease, unspecified: Secondary | ICD-10-CM | POA: Diagnosis not present

## 2018-10-21 DIAGNOSIS — E538 Deficiency of other specified B group vitamins: Secondary | ICD-10-CM | POA: Diagnosis not present

## 2018-10-21 DIAGNOSIS — E7800 Pure hypercholesterolemia, unspecified: Secondary | ICD-10-CM | POA: Diagnosis not present

## 2018-10-21 DIAGNOSIS — D649 Anemia, unspecified: Secondary | ICD-10-CM | POA: Diagnosis not present

## 2018-10-21 DIAGNOSIS — I10 Essential (primary) hypertension: Secondary | ICD-10-CM | POA: Diagnosis not present

## 2018-10-21 DIAGNOSIS — E039 Hypothyroidism, unspecified: Secondary | ICD-10-CM | POA: Diagnosis not present

## 2018-10-23 DIAGNOSIS — D485 Neoplasm of uncertain behavior of skin: Secondary | ICD-10-CM | POA: Diagnosis not present

## 2018-10-23 DIAGNOSIS — D229 Melanocytic nevi, unspecified: Secondary | ICD-10-CM | POA: Diagnosis not present

## 2018-10-23 DIAGNOSIS — Z85828 Personal history of other malignant neoplasm of skin: Secondary | ICD-10-CM | POA: Diagnosis not present

## 2018-10-23 DIAGNOSIS — L814 Other melanin hyperpigmentation: Secondary | ICD-10-CM | POA: Diagnosis not present

## 2018-10-23 DIAGNOSIS — L821 Other seborrheic keratosis: Secondary | ICD-10-CM | POA: Diagnosis not present

## 2018-10-23 DIAGNOSIS — L570 Actinic keratosis: Secondary | ICD-10-CM | POA: Diagnosis not present

## 2018-10-23 DIAGNOSIS — Z1283 Encounter for screening for malignant neoplasm of skin: Secondary | ICD-10-CM | POA: Diagnosis not present

## 2018-10-28 ENCOUNTER — Other Ambulatory Visit: Payer: Self-pay | Admitting: Internal Medicine

## 2018-10-28 DIAGNOSIS — I10 Essential (primary) hypertension: Secondary | ICD-10-CM | POA: Diagnosis not present

## 2018-10-28 DIAGNOSIS — Z1231 Encounter for screening mammogram for malignant neoplasm of breast: Secondary | ICD-10-CM

## 2018-10-28 DIAGNOSIS — J438 Other emphysema: Secondary | ICD-10-CM | POA: Diagnosis not present

## 2018-10-28 DIAGNOSIS — E538 Deficiency of other specified B group vitamins: Secondary | ICD-10-CM | POA: Diagnosis not present

## 2018-10-28 DIAGNOSIS — I2510 Atherosclerotic heart disease of native coronary artery without angina pectoris: Secondary | ICD-10-CM | POA: Diagnosis not present

## 2018-10-28 DIAGNOSIS — D649 Anemia, unspecified: Secondary | ICD-10-CM | POA: Diagnosis not present

## 2018-10-28 DIAGNOSIS — I700 Atherosclerosis of aorta: Secondary | ICD-10-CM | POA: Diagnosis not present

## 2018-10-28 DIAGNOSIS — Z0000 Encounter for general adult medical examination without abnormal findings: Secondary | ICD-10-CM | POA: Diagnosis not present

## 2018-10-28 DIAGNOSIS — M5431 Sciatica, right side: Secondary | ICD-10-CM | POA: Diagnosis not present

## 2018-10-28 DIAGNOSIS — K219 Gastro-esophageal reflux disease without esophagitis: Secondary | ICD-10-CM | POA: Diagnosis not present

## 2018-10-30 ENCOUNTER — Ambulatory Visit
Admission: RE | Admit: 2018-10-30 | Discharge: 2018-10-30 | Disposition: A | Discharge status: Home / Self Care | Payer: Medicare HMO | Source: Ambulatory Visit | Attending: Internal Medicine | Admitting: Internal Medicine

## 2018-10-30 DIAGNOSIS — Z1231 Encounter for screening mammogram for malignant neoplasm of breast: Secondary | ICD-10-CM | POA: Insufficient documentation

## 2018-11-03 ENCOUNTER — Other Ambulatory Visit: Payer: Self-pay | Admitting: Internal Medicine

## 2018-11-03 DIAGNOSIS — R928 Other abnormal and inconclusive findings on diagnostic imaging of breast: Secondary | ICD-10-CM

## 2018-11-03 DIAGNOSIS — N6489 Other specified disorders of breast: Secondary | ICD-10-CM

## 2018-11-07 ENCOUNTER — Other Ambulatory Visit: Payer: Self-pay | Admitting: Internal Medicine

## 2018-11-07 DIAGNOSIS — N6489 Other specified disorders of breast: Secondary | ICD-10-CM

## 2018-11-09 DIAGNOSIS — J449 Chronic obstructive pulmonary disease, unspecified: Secondary | ICD-10-CM | POA: Diagnosis not present

## 2018-11-10 ENCOUNTER — Ambulatory Visit
Admission: RE | Admit: 2018-11-10 | Discharge: 2018-11-10 | Disposition: A | Discharge status: Home / Self Care | Payer: Medicare HMO | Source: Ambulatory Visit | Attending: Internal Medicine | Admitting: Internal Medicine

## 2018-11-10 DIAGNOSIS — R922 Inconclusive mammogram: Secondary | ICD-10-CM | POA: Diagnosis not present

## 2018-11-10 DIAGNOSIS — N6489 Other specified disorders of breast: Secondary | ICD-10-CM | POA: Insufficient documentation

## 2018-11-10 DIAGNOSIS — R928 Other abnormal and inconclusive findings on diagnostic imaging of breast: Secondary | ICD-10-CM

## 2018-11-14 ENCOUNTER — Other Ambulatory Visit: Payer: Medicare HMO

## 2018-11-17 ENCOUNTER — Other Ambulatory Visit: Payer: Medicare HMO

## 2018-11-17 ENCOUNTER — Ambulatory Visit: Payer: Medicare HMO

## 2018-12-31 ENCOUNTER — Other Ambulatory Visit: Payer: Self-pay | Admitting: Otolaryngology

## 2018-12-31 DIAGNOSIS — R1310 Dysphagia, unspecified: Secondary | ICD-10-CM

## 2019-01-08 ENCOUNTER — Ambulatory Visit
Admission: RE | Admit: 2019-01-08 | Discharge: 2019-01-08 | Disposition: A | Discharge status: Home / Self Care | Payer: Medicare Other | Source: Ambulatory Visit | Attending: Otolaryngology | Admitting: Otolaryngology

## 2019-01-08 DIAGNOSIS — R1310 Dysphagia, unspecified: Secondary | ICD-10-CM | POA: Diagnosis present

## 2019-01-08 LAB — POCT I-STAT CREATININE: Creatinine, Ser: 1.1 mg/dL — ABNORMAL HIGH (ref 0.44–1.00)

## 2019-01-08 MED ORDER — IOPAMIDOL (ISOVUE-300) INJECTION 61%
75.0000 mL | Freq: Once | INTRAVENOUS | Status: AC | PRN
  Administered 2019-01-08: 75 mL via INTRAVENOUS

## 2019-09-09 DIAGNOSIS — D040 Carcinoma in situ of skin of lip: Secondary | ICD-10-CM

## 2019-09-09 HISTORY — DX: Carcinoma in situ of skin of lip: D04.0

## 2019-10-07 ENCOUNTER — Other Ambulatory Visit: Payer: Self-pay | Admitting: Internal Medicine

## 2019-10-07 DIAGNOSIS — Z1231 Encounter for screening mammogram for malignant neoplasm of breast: Secondary | ICD-10-CM

## 2019-11-04 ENCOUNTER — Ambulatory Visit
Admission: RE | Admit: 2019-11-04 | Discharge: 2019-11-04 | Disposition: A | Discharge status: Home / Self Care | Payer: Medicare Other | Source: Ambulatory Visit | Attending: Internal Medicine | Admitting: Internal Medicine

## 2019-11-04 DIAGNOSIS — Z1231 Encounter for screening mammogram for malignant neoplasm of breast: Secondary | ICD-10-CM | POA: Insufficient documentation

## 2019-12-09 ENCOUNTER — Other Ambulatory Visit: Payer: Self-pay | Admitting: Internal Medicine

## 2019-12-09 DIAGNOSIS — R918 Other nonspecific abnormal finding of lung field: Secondary | ICD-10-CM

## 2019-12-21 ENCOUNTER — Ambulatory Visit
Admission: RE | Admit: 2019-12-21 | Discharge: 2019-12-21 | Disposition: A | Discharge status: Home / Self Care | Payer: Medicare Other | Source: Ambulatory Visit | Attending: Internal Medicine | Admitting: Internal Medicine

## 2019-12-21 ENCOUNTER — Other Ambulatory Visit: Payer: Self-pay

## 2019-12-21 DIAGNOSIS — R918 Other nonspecific abnormal finding of lung field: Secondary | ICD-10-CM | POA: Insufficient documentation

## 2020-02-03 ENCOUNTER — Ambulatory Visit: Payer: Medicare Other | Admitting: Dermatology

## 2020-02-03 ENCOUNTER — Other Ambulatory Visit: Payer: Self-pay

## 2020-02-03 DIAGNOSIS — L905 Scar conditions and fibrosis of skin: Secondary | ICD-10-CM

## 2020-02-03 DIAGNOSIS — L570 Actinic keratosis: Secondary | ICD-10-CM | POA: Diagnosis not present

## 2020-02-03 DIAGNOSIS — L814 Other melanin hyperpigmentation: Secondary | ICD-10-CM

## 2020-02-03 DIAGNOSIS — D225 Melanocytic nevi of trunk: Secondary | ICD-10-CM

## 2020-02-03 DIAGNOSIS — L578 Other skin changes due to chronic exposure to nonionizing radiation: Secondary | ICD-10-CM

## 2020-02-03 DIAGNOSIS — L720 Epidermal cyst: Secondary | ICD-10-CM | POA: Diagnosis not present

## 2020-02-03 DIAGNOSIS — L988 Other specified disorders of the skin and subcutaneous tissue: Secondary | ICD-10-CM

## 2020-02-03 DIAGNOSIS — L821 Other seborrheic keratosis: Secondary | ICD-10-CM

## 2020-02-03 DIAGNOSIS — D229 Melanocytic nevi, unspecified: Secondary | ICD-10-CM

## 2020-02-03 DIAGNOSIS — Z86007 Personal history of in-situ neoplasm of skin: Secondary | ICD-10-CM

## 2020-02-03 NOTE — Progress Notes (Signed)
   Follow-Up Visit   Subjective  Katelyn Ball is a 74 y.o. female who presents for the following: Follow-up (recheck AK L nasal sidewall, recheck ISK R post calf) and Skin Problem (R cheek present on and off for 1 year, scaly. Check spots on face previously treated with LN2).  Patient had SCCis of the lip treated with imiquimod x 2 months with minimal reaction. Patient denies changes at the site since finishing treatment.   The following portions of the chart were reviewed this encounter and updated as appropriate:     Review of Systems: No other skin or systemic complaints.  Objective  Well appearing patient in no apparent distress; mood and affect are within normal limits.  A focused examination was performed including face, neck, arms, chest, R calf. Relevant physical exam findings are noted in the Assessment and Plan.  Objective  R cheek x 3, R nasal sidewall x 1 (4): Erythematous thin papules/macules with gritty scale.  Counseled risk of leaving white spot.   Objective  face, neck, chest, arms, legs: Diffuse scaly erythematous macules with underlying dyspigmentation.   Objective  Marionette lines: Rhytides and volume loss.   Objective  L upper cutaneous lip: Well healed scar with no evidence of recurrence  Objective  sun exposed skin: Scattered tan macules.   Objective  nose, nasal sidewall: Smooth white papule(s).   Objective  trunk and extremities: Tan-brown and/or pink-flesh-colored symmetric macules and papules.   Objective  L mid brow: Well healed scar with no evidence of recurrence, no lymphadenopathy.   Objective  Left sup breast x 2: Stuck-on, waxy, tan-brown papules and plaques -- Discussed benign etiology and prognosis.   Assessment & Plan  AK (actinic keratosis) (4) R cheek x 3, R nasal sidewall x 1  Destruction of lesion - R cheek x 3, R nasal sidewall x 1  Destruction method: cryotherapy   Informed consent: discussed and consent  obtained   Lesion destroyed using liquid nitrogen: Yes   Outcome: patient tolerated procedure well with no complications   Post-procedure details: wound care instructions given    Actinic skin damage face, neck, chest, arms, legs  Recommend daily broad spectrum sunscreen SPF 30+ to sun-exposed areas, reapply every 2 hours as needed  Elastosis of skin Marionette lines  Discussed filler for marionette lines.  Rec Alastin Restorative Skin Complex.   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    Lentigines sun exposed skin  Benign, observe  Milia nose, nasal sidewall  Benign, observe.    Nevus trunk and extremities  Benign, observe  Scar L mid brow  No evidence of recurrence, call clinic for new or changing lesions.   Seborrheic keratosis Left sup breast x 2  Benign, observe.  Recheck in 2 months.   Return in about 6 weeks (around 03/16/2020) for TBSE.   Graciella Belton CMA am acting as scribe for Forest Gleason, MD .  Documentation: I have reviewed the above documentation for accuracy and completeness, and I agree with the above.  Forest Gleason, MD

## 2020-03-16 ENCOUNTER — Ambulatory Visit: Payer: Medicare Other | Admitting: Dermatology

## 2020-05-18 ENCOUNTER — Ambulatory Visit: Payer: Medicare Other | Admitting: Dermatology

## 2020-05-18 ENCOUNTER — Other Ambulatory Visit: Payer: Self-pay

## 2020-05-18 DIAGNOSIS — D229 Melanocytic nevi, unspecified: Secondary | ICD-10-CM

## 2020-05-18 DIAGNOSIS — L570 Actinic keratosis: Secondary | ICD-10-CM

## 2020-05-18 DIAGNOSIS — L820 Inflamed seborrheic keratosis: Secondary | ICD-10-CM

## 2020-05-18 DIAGNOSIS — D1800 Hemangioma unspecified site: Secondary | ICD-10-CM

## 2020-05-18 DIAGNOSIS — L905 Scar conditions and fibrosis of skin: Secondary | ICD-10-CM | POA: Diagnosis not present

## 2020-05-18 DIAGNOSIS — L814 Other melanin hyperpigmentation: Secondary | ICD-10-CM

## 2020-05-18 DIAGNOSIS — L821 Other seborrheic keratosis: Secondary | ICD-10-CM

## 2020-05-18 DIAGNOSIS — L578 Other skin changes due to chronic exposure to nonionizing radiation: Secondary | ICD-10-CM

## 2020-05-18 DIAGNOSIS — Z86007 Personal history of in-situ neoplasm of skin: Secondary | ICD-10-CM

## 2020-05-18 DIAGNOSIS — Z85828 Personal history of other malignant neoplasm of skin: Secondary | ICD-10-CM

## 2020-05-18 DIAGNOSIS — Z1283 Encounter for screening for malignant neoplasm of skin: Secondary | ICD-10-CM

## 2020-05-18 DIAGNOSIS — L719 Rosacea, unspecified: Secondary | ICD-10-CM | POA: Diagnosis not present

## 2020-05-18 NOTE — Patient Instructions (Addendum)
Recommend daily broad spectrum sunscreen SPF 30+ to sun-exposed areas, reapply every 2 hours as needed. Call for new or changing lesions.  Prior to procedure, discussed risks of blister formation, small wound, skin dyspigmentation, or rare scar following cryotherapy.   Cryotherapy Aftercare  . Wash gently with soap and water everyday.   Marland Kitchen Apply Vaseline and Band-Aid daily until healed.   Seborrheic Keratosis  What causes seborrheic keratoses? Seborrheic keratoses are harmless, common skin growths that first appear during adult life.  As time goes by, more growths appear.  Some people may develop a large number of them.  Seborrheic keratoses appear on both covered and uncovered body parts.  They are not caused by sunlight.  The tendency to develop seborrheic keratoses can be inherited.  They vary in color from skin-colored to gray, brown, or even black.  They can be either smooth or have a rough, warty surface.   Seborrheic keratoses are superficial and look as if they were stuck on the skin.  Under the microscope this type of keratosis looks like layers upon layers of skin.  That is why at times the top layer may seem to fall off, but the rest of the growth remains and re-grows.    Treatment Seborrheic keratoses do not need to be treated, but can easily be removed in the office.  Seborrheic keratoses often cause symptoms when they rub on clothing or jewelry.  Lesions can be in the way of shaving.  If they become inflamed, they can cause itching, soreness, or burning.  Removal of a seborrheic keratosis can be accomplished by freezing, burning, or surgery. If any spot bleeds, scabs, or grows rapidly, please return to have it checked, as these can be an indication of a skin cancer.

## 2020-05-18 NOTE — Progress Notes (Signed)
Follow-Up Visit   Subjective  Katelyn Ball is a 74 y.o. female who presents for the following: TBSE.  Patient presents today for an annual TBSE, patient does have a few areas of concern on her hands and her face.  She has persistent pink spots on her legs.  She has a h/o SCCIS L upper lip treated with Efudex cream, no reaction per pt, but other different spots reacted on her upper lip area.   The following portions of the chart were reviewed this encounter and updated as appropriate:      Review of Systems:  No other skin or systemic complaints except as noted in HPI or Assessment and Plan.  Objective  Well appearing patient in no apparent distress; mood and affect are within normal limits.  A full examination was performed including scalp, head, eyes, ears, nose, lips, neck, chest, axillae, abdomen, back, buttocks, bilateral upper extremities, bilateral lower extremities, hands, feet, fingers, toes, fingernails, and toenails. All findings within normal limits unless otherwise noted below.  Objective  Left Upper Cutaneous Lip: Well healed scar with no evidence of recurrence.  Treated with 5-FU  Objective  Left Upper Cutaneous Lip, Left perioral: Pink scaly macule (superior to scar L upper lip)  Objective  Left Eyebrow: Dyspigmented smooth macule or patch. 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 Observe for recurrence. Call clinic for new or changing lesions.  Recommend regular skin exams, daily broad-spectrum spf 30+ sunscreen use, and photoprotection.     AK (actinic keratosis) (2) Left Upper Cutaneous Lip; Left  perioral  AK vs ISK  Cryotherapy today Prior to procedure, discussed risks of blister formation, small wound, skin dyspigmentation, or rare scar following cryotherapy.   Recheck L upper lip on f/up   Destruction of lesion - Left Upper Cutaneous Lip, Left perioral  Destruction method: cryotherapy   Informed consent: discussed and consent obtained   Lesion destroyed using liquid nitrogen: Yes   Region frozen until ice ball extended beyond lesion: Yes   Outcome: patient tolerated procedure well with no complications   Post-procedure details: wound care instructions given    Scar Left Eyebrow  Clear. Observe for recurrence. Call clinic for new or changing lesions.  Recommend regular skin exams, daily broad-spectrum spf 30+ sunscreen use, and photoprotection.     Rosacea Cheeks and nose  Mild, no Rx treatment at this time Recommend daily broad spectrum sunscreen SPF 30+ to sun-exposed areas, reapply every 2 hours as needed.   Inflamed seborrheic keratosis (10) Right Pretibia x 3 (3); Left posterior calf; Right lower Sternum; Right Upper Back x 2 (2); Left Dorsal Hand x 2 (2); Right Upper Cutaneous Lip  Cryotherapy today Prior to procedure, discussed risks of blister formation, small wound, skin dyspigmentation, or rare scar following cryotherapy.   Recheck on follow up Right pretibia   Destruction of lesion - Right Upper Back x 2, Right Upper Cutaneous Lip  Destruction method: cryotherapy   Informed consent: discussed and consent obtained   Lesion destroyed using liquid nitrogen: Yes   Region frozen until ice ball extended beyond lesion: Yes   Outcome:  patient tolerated procedure well with no complications   Post-procedure details: wound care instructions given     Lentigines - Scattered tan macules - Discussed due to sun exposure - Benign, observe - Call for any changes  Seborrheic Keratoses (Right mid back at bra line)  -Stuck-on, waxy, tan-brown papules and  plaques  - Discussed benign etiology and prognosis. - Observe - Call for any changes  Melanocytic Nevi - Tan-brown and/or pink-flesh-colored symmetric macules and papules - Benign appearing on exam today - Observation - Call clinic for new or changing moles - Recommend daily use of broad spectrum spf 30+ sunscreen to sun-exposed areas.   Hemangiomas - Red papules - Discussed benign nature - Observe - Call for any changes  Actinic Damage - diffuse scaly erythematous macules with underlying dyspigmentation - Recommend daily broad spectrum sunscreen SPF 30+ to sun-exposed areas, reapply every 2 hours as needed.  - Call for new or changing lesions.  History of Basal Cell Carcinoma of the Skin - No evidence of recurrence today - Recommend regular full body skin exams - Recommend daily broad spectrum sunscreen SPF 30+ to sun-exposed areas, reapply every 2 hours as needed.  - Call if any new or changing lesions are noted between office visits  History of Squamous Cell Carcinoma in Situ of the Skin - No evidence of recurrence today - Recommend regular full body skin exams - Recommend daily broad spectrum sunscreen SPF 30+ to sun-exposed areas, reapply every 2 hours as needed.  - Call if any new or changing lesions are noted between office visits  Skin cancer screening performed today.  Return in about 6 months (around 11/17/2020) for AK/ISK follow up, h/o SCCIS.  Marene Lenz, CMA, am acting as scribe for Brendolyn Patty, MD .  Documentation: I have reviewed the above documentation for accuracy and completeness, and I agree with the above.  Brendolyn Patty MD

## 2020-11-23 ENCOUNTER — Ambulatory Visit: Payer: Medicare Other | Admitting: Dermatology

## 2020-11-23 ENCOUNTER — Encounter: Payer: Self-pay | Admitting: Dermatology

## 2020-11-23 ENCOUNTER — Other Ambulatory Visit: Payer: Self-pay

## 2020-11-23 DIAGNOSIS — Z85828 Personal history of other malignant neoplasm of skin: Secondary | ICD-10-CM

## 2020-11-23 DIAGNOSIS — L578 Other skin changes due to chronic exposure to nonionizing radiation: Secondary | ICD-10-CM | POA: Diagnosis not present

## 2020-11-23 DIAGNOSIS — D485 Neoplasm of uncertain behavior of skin: Secondary | ICD-10-CM

## 2020-11-23 MED ORDER — GENTAMICIN SULFATE 0.1 % EX OINT: 1.0000 "application " | TOPICAL_OINTMENT | Freq: Every day | CUTANEOUS | 0 refills | Status: DC

## 2020-11-23 NOTE — Progress Notes (Signed)
Follow-Up Visit   Subjective  Katelyn Ball is a 75 y.o. female who presents for the following: Follow-up (Patient here today for 6 month AK/ISK follow up. She does have a hx of SCC at left upper lip that we are rechecking today. ).  Patient also has a spot at right top of ear, present for 4 months, painful and bleeds when scratched. Also has a pink spot at right side of nose, present for a few months, scabs up and bleeds when scratched.   The following portions of the chart were reviewed this encounter and updated as appropriate:   Tobacco  Allergies  Meds  Problems  Med Hx  Surg Hx  Fam Hx      Review of Systems:  No other skin or systemic complaints except as noted in HPI or Assessment and Plan.  Objective  Well appearing patient in no apparent distress; mood and affect are within normal limits.  A focused examination was performed including face, ears, shoulders. Relevant physical exam findings are noted in the Assessment and Plan.  Objective  Right helix: 0.5cm scaly pink papule       Assessment & Plan  Neoplasm of uncertain behavior of skin Right helix  Skin / nail biopsy Type of biopsy: tangential   Informed consent: discussed and consent obtained   Patient was prepped and draped in usual sterile fashion: Area prepped with alcohol. Anesthesia: the lesion was anesthetized in a standard fashion   Anesthetic:  1% lidocaine w/ epinephrine 1-100,000 buffered w/ 8.4% NaHCO3 Instrument used: flexible razor blade   Hemostasis achieved with: pressure, aluminum chloride and electrodesiccation   Outcome: patient tolerated procedure well   Post-procedure details: wound care instructions given   Post-procedure details comment:  Ointment and small bandage applied  Specimen 1 - Surgical pathology Differential Diagnosis: r/o SCC vs CDNH  Check Margins: No 0.5cm scaly pink papule  Start gentamicin to aa daily with dressing change.   Ordered  Medications: gentamicin ointment (GARAMYCIN) 0.1 %   History of Squamous Cell Carcinoma in Situ of the Skin s/p imiquimod - No evidence of recurrence today at left upper cutaneous lip - Recommend regular full body skin exams - Recommend daily broad spectrum sunscreen SPF 30+ to sun-exposed areas, reapply every 2 hours as needed.  - Call if any new or changing lesions are noted between office visits  Actinic Damage - Severe, chronic, secondary to cumulative UV radiation exposure over time - diffuse scaly erythematous macules and papules with underlying dyspigmentation - Discussed Prescription "Field Treatment" for Severe, Chronic Confluent Actinic Changes with Pre-Cancerous Actinic Keratoses Field treatment involves treatment of an entire area of skin that has confluent Actinic Changes (Sun/ Ultraviolet light damage) and PreCancerous Actinic Keratoses by method of PhotoDynamic Therapy (PDT) and/or prescription Topical Chemotherapy agents such as 5-fluorouracil, 5-fluorouracil/calcipotriene, and/or imiquimod.  The purpose is to decrease the number of clinically evident and subclinical PreCancerous lesions to prevent progression to development of skin cancer by chemically destroying early precancer changes that may or may not be visible.  It has been shown to reduce the risk of developing skin cancer in the treated area. As a result of treatment, redness, scaling, crusting, and open sores may occur during treatment course. One or more than one of these methods may be used and may have to be used several times to control, suppress and eliminate the PreCancerous changes. Discussed treatment course, expected reaction, and possible side effects. - Recommend daily broad spectrum sunscreen SPF 30+  to sun-exposed areas, reapply every 2 hours as needed.  - Call for new or changing lesions. - Start 5-fluorouracil/calcipotriene cream twice a day for 4 days to affected areas including face (including right  nasofacial angle), 7 days to right shoulder. Prescription sent to [facility]. Patient advised they will receive an email to purchase the medication online and have it sent to their home. Patient provided with handout reviewing treatment course and side effects and advised to call or message [place] on MyChart with any concerns.   Return in about 6 weeks (around 01/04/2021) for AK follow up.  Graciella Belton, RMA, am acting as scribe for Forest Gleason, MD .  Documentation: I have reviewed the above documentation for accuracy and completeness, and I agree with the above.  Forest Gleason, MD

## 2020-11-23 NOTE — Patient Instructions (Addendum)
Wound Care Instructions  1. Cleanse wound gently with soap and water once a day then pat dry with clean gauze. Apply a thing coat of Petrolatum (petroleum jelly, "Vaseline") over the wound (unless you have an allergy to this). We recommend that you use a new, sterile tube of Vaseline. Do not pick or remove scabs. Do not remove the yellow or white "healing tissue" from the base of the wound.  2. Cover the wound with fresh, clean, nonstick gauze and secure with paper tape. You may use Band-Aids in place of gauze and tape if the would is small enough, but would recommend trimming much of the tape off as there is often too much. Sometimes Band-Aids can irritate the skin.  3. You should call the office for your biopsy report after 1 week if you have not already been contacted.  4. If you experience any problems, such as abnormal amounts of bleeding, swelling, significant bruising, significant pain, or evidence of infection, please call the office immediately.  5. FOR ADULT SURGERY PATIENTS: If you need something for pain relief you may take 1 extra strength Tylenol (acetaminophen) AND 2 Ibuprofen (200mg  each) together every 4 hours as needed for pain. (do not take these if you are allergic to them or if you have a reason you should not take them.) Typically, you may only need pain medication for 1 to 3 days.    Instructions for [facility] Medications  One or more of your medications was sent to the [facility] mail order compounding pharmacy. You will receive an email from them and can purchase the medicine through that link. It will then be mailed to your home at the address you confirmed. If for any reason you do not receive an email from them, please check your spam folder. If you still do not find the email, please let us know. [facility] phone number is [phone].  5-Fluorouracil/Calcipotriene Patient Education   Actinic keratoses are the dry, red scaly spots on the skin caused by  sun damage. A portion of these spots can turn into skin cancer with time, and treating them can help prevent development of skin cancer.   Treatment of these spots requires removal of the defective skin cells. There are various ways to remove actinic keratoses, including freezing with liquid nitrogen, treatment with creams, or treatment with a blue light procedure in the office.   5-fluorouracil cream is a topical cream used to treat actinic keratoses. It works by interfering with the growth of abnormal fast-growing skin cells, such as actinic keratoses. These cells peel off and are replaced by healthy ones.   5-fluorouracil/calcipotriene is a combination of the 5-fluorouracil cream with a vitamin D analog cream called calcipotriene. The calcipotriene alone does not treat actinic keratoses. However, when it is combined with 5-fluorouracil, it helps the 5-fluorouracil treat the actinic keratoses much faster so that the same results can be achieved with a much shorter treatment time.  INSTRUCTIONS FOR 5-FLUOROURACIL/CALCIPOTRIENE CREAM:   5-fluorouracil/calcipotriene cream typically only needs to be used for 4 days to face, 7 days to right shoulder. A thin layer should be applied twice a day to the treatment areas (full face avoiding eyes, creases of nose, mouth) recommended by your physician.   If your physician prescribed you separate tubes of 5-fluourouracil and calcipotriene, apply a thin layer of 5-fluorouracil followed by a thin layer of calcipotriene.   Avoid contact with your eyes, nostrils, and mouth. Do not use 5-fluorouracil/calcipotriene cream on infected or  open wounds.   You will develop redness, irritation and some crusting at areas where you have pre-cancer damage/actinic keratoses. IF YOU DEVELOP PAIN, BLEEDING, OR SIGNIFICANT CRUSTING, STOP THE TREATMENT EARLY - you have already gotten a good response and the actinic keratoses should clear up well.  Wash your hands after applying  5-fluorouracil 5% cream on your skin.   A moisturizer or sunscreen with a minimum SPF 30 should be applied each morning.   Once you have finished the treatment, you can apply a thin layer of Vaseline twice a day to irritated areas to soothe and calm the areas more quickly. If you experience significant discomfort, contact your physician.  For some patients it is necessary to repeat the treatment for best results.  SIDE EFFECTS: When using 5-fluorouracil/calcipotriene cream, you may have mild irritation, such as redness, dryness, swelling, or a mild burning sensation. This usually resolves within 2 weeks. The more actinic keratoses you have, the more redness and inflammation you can expect during treatment. Eye irritation has been reported rarely. If this occurs, please let us know.  If you have any trouble using this cream, please call the office. If you have any other questions about this information, please do not hesitate to ask me before you leave the office.

## 2020-11-29 ENCOUNTER — Telehealth: Payer: Self-pay

## 2020-11-29 NOTE — Telephone Encounter (Signed)
-----   Message from [place], MD sent at 11/29/2020 11:38 AM EST ----- Skin , right helix CHONDRODERMATITIS NODULARIS HELICIS -> this is pressure change from sleeping on that side.  No cancer seen.  Recommend avoiding pressure on that side by sleeping on your back or on the other side or by using a different pillow to relieve pressure at the ER.  There are doughnut pillows available but sometimes pillows with different filling can also put less pressure on that area.  MAs please call. Thank you!

## 2020-11-29 NOTE — Telephone Encounter (Signed)
Called and informed patient of results and recommendations. She verbalized understanding and denied questions at this time.

## 2020-12-15 ENCOUNTER — Other Ambulatory Visit: Payer: Self-pay

## 2020-12-15 DIAGNOSIS — L578 Other skin changes due to chronic exposure to nonionizing radiation: Secondary | ICD-10-CM

## 2020-12-15 MED ORDER — HYDROCORTISONE 2.5 % EX CREA: TOPICAL_CREAM | Freq: Two times a day (BID) | CUTANEOUS | 1 refills | Status: DC

## 2021-01-02 ENCOUNTER — Other Ambulatory Visit: Payer: Self-pay | Admitting: Internal Medicine

## 2021-01-02 DIAGNOSIS — Z1231 Encounter for screening mammogram for malignant neoplasm of breast: Secondary | ICD-10-CM

## 2021-01-04 ENCOUNTER — Other Ambulatory Visit: Payer: Self-pay

## 2021-01-04 ENCOUNTER — Ambulatory Visit: Payer: Medicare Other | Admitting: Dermatology

## 2021-01-04 DIAGNOSIS — H61001 Unspecified perichondritis of right external ear: Secondary | ICD-10-CM

## 2021-01-04 DIAGNOSIS — L578 Other skin changes due to chronic exposure to nonionizing radiation: Secondary | ICD-10-CM

## 2021-01-04 NOTE — Progress Notes (Signed)
   Follow-Up Visit   Subjective  Katelyn Ball is a 75 y.o. female who presents for the following: 6 week (6 week follow up for ak on face. Patient reports her face was very red and crusty. She has continued using vaseline on areas. ).  The following portions of the chart were reviewed this encounter and updated as appropriate:  Tobacco  Allergies  Meds  Problems  Med Hx  Surg Hx  Fam Hx      Objective  Well appearing patient in no apparent distress; mood and affect are within normal limits.  A focused examination was performed including face, right nasofacial angle, right shoulder, right helix . Relevant physical exam findings are noted in the Assessment and Plan.  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   Continue trying different positions of sleeping to avoid pressure on right ear.    Actinic Damage - chronic, secondary to cumulative UV radiation exposure/sun exposure over time; currently well-controlled - Improved s/p 5FU/calcipotriene treatment with vigorous response. In future may reduce treatment duration to 2 1/2 - 3 days for face and hand , 4 - 5 days for other areas of body provided she still gets redness and early crusting - diffuse scaly erythematous macules with underlying dyspigmentation - Recommend daily broad spectrum sunscreen SPF 30+ to sun-exposed areas, reapply every 2 hours as needed.  - Call for new or changing lesions.  Return if symptoms worsen or fail to improve, for follow up as schedule 7/6.  I, Ruthell Rummage, CMA, am acting as scribe for Forest Gleason, MD.  Documentation: I have reviewed the above documentation for accuracy and completeness, and I agree with the above.  Forest Gleason, MD

## 2021-01-04 NOTE — Patient Instructions (Addendum)
Recommend daily broad spectrum sunscreen SPF 30+ to sun-exposed areas, reapply every 2 hours as needed. Call for new or changing lesions.  

## 2021-01-05 ENCOUNTER — Encounter: Payer: Self-pay | Admitting: Dermatology

## 2021-01-09 ENCOUNTER — Other Ambulatory Visit: Payer: Self-pay | Admitting: Family Medicine

## 2021-01-09 DIAGNOSIS — R1031 Right lower quadrant pain: Secondary | ICD-10-CM

## 2021-01-23 ENCOUNTER — Ambulatory Visit
Admission: RE | Admit: 2021-01-23 | Discharge: 2021-01-23 | Disposition: A | Discharge status: Home / Self Care | Payer: Medicare Other | Source: Ambulatory Visit | Attending: Internal Medicine | Admitting: Internal Medicine

## 2021-01-23 ENCOUNTER — Ambulatory Visit
Admission: RE | Admit: 2021-01-23 | Discharge: 2021-01-23 | Disposition: A | Discharge status: Home / Self Care | Payer: Medicare Other | Source: Ambulatory Visit | Attending: Family Medicine | Admitting: Family Medicine

## 2021-01-23 ENCOUNTER — Other Ambulatory Visit: Payer: Self-pay

## 2021-01-23 DIAGNOSIS — K5730 Diverticulosis of large intestine without perforation or abscess without bleeding: Secondary | ICD-10-CM | POA: Diagnosis not present

## 2021-01-23 DIAGNOSIS — R1031 Right lower quadrant pain: Secondary | ICD-10-CM | POA: Insufficient documentation

## 2021-01-23 DIAGNOSIS — I700 Atherosclerosis of aorta: Secondary | ICD-10-CM | POA: Diagnosis not present

## 2021-01-23 DIAGNOSIS — Z1231 Encounter for screening mammogram for malignant neoplasm of breast: Secondary | ICD-10-CM | POA: Diagnosis present

## 2021-01-23 MED ORDER — IOHEXOL 300 MG/ML  SOLN
100.0000 mL | Freq: Once | INTRAMUSCULAR | Status: AC | PRN
  Administered 2021-01-23: 16:00:00 100 mL via INTRAVENOUS

## 2021-01-31 ENCOUNTER — Ambulatory Visit (INDEPENDENT_AMBULATORY_CARE_PROVIDER_SITE_OTHER): Payer: Medicare Other | Admitting: Obstetrics and Gynecology

## 2021-01-31 ENCOUNTER — Other Ambulatory Visit: Payer: Self-pay

## 2021-01-31 ENCOUNTER — Encounter: Payer: Self-pay | Admitting: Obstetrics and Gynecology

## 2021-01-31 VITALS — BP 126/84 | Wt 181.0 lb

## 2021-01-31 DIAGNOSIS — N816 Rectocele: Secondary | ICD-10-CM

## 2021-01-31 DIAGNOSIS — N8111 Cystocele, midline: Secondary | ICD-10-CM

## 2021-01-31 NOTE — Progress Notes (Signed)
Obstetrics & Gynecology Office Visit   Chief Complaint  Patient presents with  . Consult  Rectocele  History of Present Illness: 75 y.o. G5P2003 female who states that she has been diagnosed with a rectocele.  She was diagnosed with a rectocele a couple of years ago. She has tried a pessary, which did not help.  She states that she has had an anterior and posterior repair in the past.  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  She attempted a pessary, which did not work. She would like a surgery for her rectocele.    Past Medical History:  Diagnosis Date  . Actinic keratosis   . Anemia   . Atelectasis   . B12 deficiency   . Cancer (HCC)    skin  . DDD (degenerative disc disease), cervical   . Dysrhythmia   . GERD (gastroesophageal reflux disease)   . History of chicken pox   . Hyperlipidemia   . Hypertension   . Iritis   . Meniere disease   . 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 Vitamin D deficiency     Past Surgical History:  Procedure Laterality Date  . ABDOMINAL HYSTERECTOMY    . BREAST CYST ASPIRATION Left   . BREAST CYST ASPIRATION Right 09/25/2017   2 FNA's done on cysts  . COLONOSCOPY WITH PROPOFOL N/A 10/05/2015   Procedure: COLONOSCOPY WITH PROPOFOL;  Surgeon: Scot Jun, MD;  Location: [facility] ENDOSCOPY;  Service: Endoscopy;  Laterality: N/A;  . ESOPHAGOGASTRODUODENOSCOPY (EGD) WITH PROPOFOL N/A 10/05/2015   Procedure: ESOPHAGOGASTRODUODENOSCOPY (EGD) WITH PROPOFOL;  Surgeon: Scot Jun, MD;  Location: [facility] ENDOSCOPY;  Service: Endoscopy;  Laterality: N/A;  . JOINT REPLACEMENT Bilateral 11/2012  . LAMINECTOMY AND MICRODISCECTOMY LUMBAR SPINE    . PILONIDAL CYST DRAINAGE    . TONSILLECTOMY      Gynecologic History: No LMP recorded. Patient has had a hysterectomy.  Obstetric History: G2P2003  Family History  Problem Relation Age of Onset  . Breast cancer  Neg Hx     Social History   Socioeconomic History  . Marital status: Married    Spouse name: Not on file  . Number of children: Not on file  . Years of education: Not on file  . Highest education level: Not on file  Occupational History  . Not on file  Tobacco Use  . Smoking status: Former Smoker    Quit date: 10/03/2006    Years since quitting: 14.3  . Smokeless tobacco: Never Used  Substance and Sexual Activity  . Alcohol use: No  . Drug use: No  . Sexual activity: Not on file  Other Topics Concern  . Not on file  Social History Narrative  . Not on file   Social Determinants of Health   Financial Resource Strain: Not on file  Food Insecurity: Not on file  Transportation Needs: Not on file  Physical Activity: Not on file  Stress: Not on file  Social Connections: Not on file  Intimate Partner Violence: Not on file    Allergies  Allergen Reactions  . Ciprofloxacin     Unknown   . Cyclobenzaprine Nausea Only  . Cymbalta [Duloxetine Hcl] Nausea Only  . Demerol [Meperidine] Nausea Only  . Erythromycin     Jaundice  . Fiorinal [Butalbital-Aspirin-Caffeine]   . Fish Oil Nausea Only  . Motrin [Ibuprofen] Nausea Only  . Naproxen  Nausea Only    Prior to Admission medications   Medication Sig Start Date End Date Taking? Authorizing Provider  Calcium Carbonate-Vitamin D (CALTRATE 600+D) 600-400 MG-UNIT tablet Take 1 tablet by mouth 2 (two) times daily.     [provider]  cetirizine (ZYRTEC) 10 MG tablet Take 10 mg by mouth daily.    [provider]  chlorthalidone (HYGROTON) 25 MG tablet Take by mouth. 01/06/20 01/05/21  [provider]  cholecalciferol (VITAMIN D) 400 UNITS TABS tablet Take 400 Units by mouth daily.    [provider]  clonazePAM (KLONOPIN) 1 MG tablet Take 1 mg by mouth at bedtime.    [provider]  Cyanocobalamin 5000 MCG SUBL Place under the tongue.    [provider]  diclofenac (VOLTAREN)  75 MG EC tablet Take 75 mg by mouth 2 (two) times daily.    [provider]  estradiol (CLIMARA - DOSED IN MG/24 HR) 0.1 mg/24hr patch Place 0.1 mg onto the skin once a week.    [provider]  fenofibrate 160 MG tablet Take 160 mg by mouth daily.    [provider]  gentamicin ointment (GARAMYCIN) 0.1 % Apply 1 application topically daily. 11/23/20   Moye, [place], MD  hydrocortisone 2.5 % cream Apply topically 2 (two) times daily. For 1 week. 12/15/20   Moye, [place], MD  ipratropium (ATROVENT HFA) 17 MCG/ACT inhaler Inhale 2 puffs into the lungs every 6 (six) hours as needed for wheezing.    [provider]  magnesium oxide (MAG-OX) 400 MG tablet Take by mouth.    [provider]  metoprolol succinate (TOPROL-XL) 25 MG 24 hr tablet Take 25 mg by mouth daily.    [provider]  metroNIDAZOLE (FLAGYL) 500 MG tablet Take 500 mg by mouth 4 (four) times daily.    [provider]  mometasone (ASMANEX) 220 MCG/INH inhaler Inhale 1 puff into the lungs 2 (two) times daily.    [provider]  montelukast (SINGULAIR) 10 MG tablet Take 10 mg by mouth at bedtime.    [provider]  pantoprazole (PROTONIX) 40 MG tablet Take 40 mg by mouth daily.    [provider]  PARoxetine (PAXIL) 10 MG tablet Take 10 mg by mouth daily.    [provider]  sucralfate (CARAFATE) 1 G tablet Take 1 g by mouth 4 (four) times daily -  with meals and at bedtime.    [provider]  sulfamethoxazole-trimethoprim (BACTRIM DS,SEPTRA DS) 800-160 MG tablet Take 1 tablet by mouth 2 (two) times daily.    [provider]  thyroid (ARMOUR) 30 MG tablet TAKE 1 TABLET (30 MG TOTAL) BY MOUTH EVERY MORNING BEFORE BREAKFAST  2 TABLETS BY MOUTH IN AM 05/17/20   [provider]    Review of Systems  Constitutional: Negative.   HENT: Negative.   Eyes: Negative.   Respiratory: Negative.   Cardiovascular: Negative.    Gastrointestinal: Negative.   Genitourinary: Negative.   Musculoskeletal: Negative.   Skin: Negative.   Neurological: Negative.   Psychiatric/Behavioral: Negative.      Physical Exam BP 126/84   Wt 181 lb (82.1 kg)   BMI 30.12 kg/m  No LMP recorded. Patient has had a hysterectomy. Physical Exam Constitutional:      General: She is not in acute distress.    Appearance: Normal appearance.  HENT:     Head: Normocephalic and atraumatic.  Eyes:     General: No scleral icterus.  Conjunctiva/sclera: Conjunctivae normal.  Neurological:     General: No focal deficit present.     Mental Status: She is alert and oriented to person, place, and time.     Cranial Nerves: No cranial nerve deficit.  Psychiatric:        Mood and Affect: Mood normal.        Behavior: Behavior normal.        Judgment: Judgment normal.     Female chaperone present for pelvic and breast  portions of the physical exam  Assessment: 75 y.o. G4P2003 female here for  1. Rectocele   2. Cystocele, midline      Plan: Problem List Items Addressed This Visit   None   Visit Diagnoses    Rectocele    -  Primary   Relevant Orders   Ambulatory referral to Urogynecology   Cystocele, midline       Relevant Orders   Ambulatory referral to Urogynecology     Grade 1 cystocele Grade 2 rectocele H/o a&p repair, failed   Refer to [facility]  Dr. Justin Mend  A total of 25 minutes were spent face-to-face with the patient as well as preparation, review, communication, and documentation during this encounter.    Thomasene Mohair, MD 01/31/2021 4:02 PM

## 2021-02-01 ENCOUNTER — Encounter: Payer: Self-pay | Admitting: Obstetrics and Gynecology

## 2021-04-03 ENCOUNTER — Telehealth: Payer: Self-pay

## 2021-04-03 NOTE — Telephone Encounter (Signed)
Patient had topical Fluorouracil from previous treatment by Dr. Laurence Ferrari. Patient noticed spots on her legs that she used cream on without being directed by Dr. Laurence Ferrari. Patient states its been 6 weeks and these areas are not healing. She states they are red and seem to be leaving a scar.

## 2021-04-05 ENCOUNTER — Ambulatory Visit: Payer: Medicare Other | Admitting: Dermatology

## 2021-04-05 ENCOUNTER — Other Ambulatory Visit: Payer: Self-pay

## 2021-04-05 DIAGNOSIS — D489 Neoplasm of uncertain behavior, unspecified: Secondary | ICD-10-CM

## 2021-04-05 DIAGNOSIS — D485 Neoplasm of uncertain behavior of skin: Secondary | ICD-10-CM

## 2021-04-05 DIAGNOSIS — L570 Actinic keratosis: Secondary | ICD-10-CM

## 2021-04-05 DIAGNOSIS — L578 Other skin changes due to chronic exposure to nonionizing radiation: Secondary | ICD-10-CM

## 2021-04-05 MED ORDER — MUPIROCIN 2 % EX OINT: 1.0000 "application " | TOPICAL_OINTMENT | Freq: Every day | CUTANEOUS | 0 refills | Status: DC

## 2021-04-05 NOTE — Progress Notes (Signed)
Follow-Up Visit   Subjective  Katelyn Ball is a 75 y.o. female who presents for the following: Follow-up (Patient states 2 months ago she started using the flurouracil cream on legs to treat spots she noticed.  She states she starting using cream on without being directed by Dr. Laurence Ferrari. Patient states its been 6 weeks and these areas are not healing. She states they are red and seem to be leaving a scar. She reports today they are looking some better. ).  The following portions of the chart were reviewed this encounter and updated as appropriate:  Tobacco  Allergies  Meds  Problems  Med Hx  Surg Hx  Fam Hx       Objective  Well appearing patient in no apparent distress; mood and affect are within normal limits.  A focused examination was performed including face, nose, left elbow, lower legs. Relevant physical exam findings are noted in the Assessment and Plan.  Objective  Right Nasal Sidewall: 0.4 cm pink thin papule        Objective  Left Elbow - Posterior: Erythematous thin papules/macules with gritty scale.   Assessment & Plan  Neoplasm of uncertain behavior Right Nasal Sidewall  Skin / nail biopsy Type of biopsy: tangential   Informed consent: discussed and consent obtained   Timeout: patient name, date of birth, surgical site, and procedure verified   Procedure prep:  Patient was prepped and draped in usual sterile fashion Prep type:  Isopropyl alcohol Anesthesia: the lesion was anesthetized in a standard fashion   Anesthetic:  1% lidocaine w/ epinephrine 1-100,000 buffered w/ 8.4% NaHCO3 Instrument used: flexible razor blade   Hemostasis achieved with: aluminum chloride   Outcome: patient tolerated procedure well   Post-procedure details: wound care instructions given   Additional details:  Petrolatum and a pressure bandage applied  Specimen 1 - Surgical pathology Differential Diagnosis: r/o bcc   Check Margins: No 0.4 cm pink thin papule  R/o  bcc   Actinic skin damage  Ordered Medications: mupirocin ointment (BACTROBAN) 2 %  Other Related Medications hydrocortisone 2.5 % cream  Actinic keratosis Left Elbow - Posterior  Recommend - Start 5-fluorouracil/calcipotriene cream twice a day for 5 - 7 days to affected areas including left elbow.   Actinic keratoses are precancerous spots that appear secondary to cumulative UV radiation exposure/sun exposure over time. They are chronic with expected duration over 1 year. A portion of actinic keratoses will progress to squamous cell carcinoma of the skin. It is not possible to reliably predict which spots will progress to skin cancer and so treatment is recommended to prevent development of skin cancer.  Recommend daily broad spectrum sunscreen SPF 30+ to sun-exposed areas, reapply every 2 hours as needed.  Recommend staying in the shade or wearing long sleeves, sun glasses (UVA+UVB protection) and wide brim hats (4-inch brim around the entire circumference of the hat). Call for new or changing lesions.    Actinic Damage- Chronic, secondary to cumulative UV radiation exposure over time - diffuse scaly erythematous macules and papules with underlying dyspigmentation - Discussed Prescription "Field Treatment" for Severe, Chronic Confluent Actinic Changes with Pre-Cancerous Actinic Keratoses Field treatment involves treatment of an entire area of skin that has confluent Actinic Changes (Sun/ Ultraviolet light damage) and PreCancerous Actinic Keratoses by method of PhotoDynamic Therapy (PDT) and/or prescription Topical Chemotherapy agents such as 5-fluorouracil, 5-fluorouracil/calcipotriene, and/or imiquimod.  The purpose is to decrease the number of clinically evident and subclinical PreCancerous lesions to prevent  progression to development of skin cancer by chemically destroying early precancer changes that may or may not be visible.  It has been shown to reduce the risk of developing skin  cancer in the treated area. As a result of treatment, redness, scaling, crusting, and open sores may occur during treatment course. One or more than one of these methods may be used and may have to be used several times to control, suppress and eliminate the PreCancerous changes. Discussed treatment course, expected reaction, and possible side effects. - Recommend daily broad spectrum sunscreen SPF 30+ to sun-exposed areas, reapply every 2 hours as needed.  - Staying in the shade or wearing long sleeves, sun glasses (UVA+UVB protection) and wide brim hats (4-inch brim around the entire circumference of the hat) are also recommended. - Call for new or changing lesions. - Status post 5-FU/calcipotriene for 7 days with good reaction, slow to heal on legs. Start mupirocin to any open areas and cover with bandaid daily. Advised that legs are always slow to heal.  I, Ruthell Rummage, CMA, am acting as scribe for Forest Gleason, MD.  Documentation: I have reviewed the above documentation for accuracy and completeness, and I agree with the above.  Forest Gleason, MD

## 2021-04-05 NOTE — Patient Instructions (Addendum)
Biopsy Wound Care Instructions  1. Leave the original bandage on for 24 hours if possible.  If the bandage becomes soaked or soiled before that time, it is OK to remove it and examine the wound.  A small amount of post-operative bleeding is normal.  If excessive bleeding occurs, remove the bandage, place gauze over the site and apply continuous pressure (no peeking) over the area for 30 minutes. If this does not work, please call our clinic as soon as possible or page your doctor if it is after hours.   2. Once a day, cleanse the wound with soap and water. It is fine to shower. If a thick crust develops you may use a Q-tip dipped into dilute hydrogen peroxide (mix 1:1 with water) to dissolve it.  Hydrogen peroxide can slow the healing process, so use it only as needed.    3. After washing, apply petroleum jelly (Vaseline) or an antibiotic ointment if your doctor prescribed one for you, followed by a bandage.    4. For best healing, the wound should be covered with a layer of ointment at all times. If you are not able to keep the area covered with a bandage to hold the ointment in place, this may mean re-applying the ointment several times a day.  Continue this wound care until the wound has healed and is no longer open.   Itching and mild discomfort is normal during the healing process. However, if you develop pain or severe itching, please call our office.   If you have any discomfort, you can take Tylenol (acetaminophen) or ibuprofen as directed on the bottle. (Please do not take these if you have an allergy to them or cannot take them for another reason).  Some redness, tenderness and white or yellow material in the wound is normal healing.  If the area becomes very sore and red, or develops a thick yellow-green material (pus), it may be infected; please notify us.    If you have stitches, return to clinic as directed to have the stitches removed. You will continue wound care for 2-3 days after  the stitches are removed.   Wound healing continues for up to one year following surgery. It is not unusual to experience pain in the scar from time to time during the interval.  If the pain becomes severe or the scar thickens, you should notify the office.    A slight amount of redness in a scar is expected for the first six months.  After six months, the redness will fade and the scar will soften and fade.  The color difference becomes less noticeable with time.  If there are any problems, return for a post-op surgery check at your earliest convenience.  To improve the appearance of the scar, you can use silicone scar gel, cream, or sheets (such as Mederma or Serica) every night for up to one year. These are available over the counter (without a prescription).  Please call our office at [phone] for any questions or concerns.  5-Fluorouracil/Calcipotriene Patient Education   Actinic keratoses are the dry, red scaly spots on the skin caused by sun damage. A portion of these spots can turn into skin cancer with time, and treating them can help prevent development of skin cancer.   Treatment of these spots requires removal of the defective skin cells. There are various ways to remove actinic keratoses, including freezing with liquid nitrogen, treatment with creams, or treatment with a blue light procedure in the  office.   5-fluorouracil cream is a topical cream used to treat actinic keratoses. It works by interfering with the growth of abnormal fast-growing skin cells, such as actinic keratoses. These cells peel off and are replaced by healthy ones.   5-fluorouracil/calcipotriene is a combination of the 5-fluorouracil cream with a vitamin D analog cream called calcipotriene. The calcipotriene alone does not treat actinic keratoses. However, when it is combined with 5-fluorouracil, it helps the 5-fluorouracil treat the actinic keratoses much faster so that the same results can be achieved with a  much shorter treatment time.  INSTRUCTIONS FOR 5-FLUOROURACIL/CALCIPOTRIENE CREAM:   5-fluorouracil/calcipotriene cream typically only needs to be used for 4-7 days. A thin layer should be applied twice a day to the treatment areas recommended by your physician.   If your physician prescribed you separate tubes of 5-fluourouracil and calcipotriene, apply a thin layer of 5-fluorouracil followed by a thin layer of calcipotriene.   Avoid contact with your eyes, nostrils, and mouth. Do not use 5-fluorouracil/calcipotriene cream on infected or open wounds.   You will develop redness, irritation and some crusting at areas where you have pre-cancer damage/actinic keratoses. IF YOU DEVELOP PAIN, BLEEDING, OR SIGNIFICANT CRUSTING, STOP THE TREATMENT EARLY - you have already gotten a good response and the actinic keratoses should clear up well.  Wash your hands after applying 5-fluorouracil 5% cream on your skin.   A moisturizer or sunscreen with a minimum SPF 30 should be applied each morning.   Once you have finished the treatment, you can apply a thin layer of Vaseline twice a day to irritated areas to soothe and calm the areas more quickly. If you experience significant discomfort, contact your physician.  For some patients it is necessary to repeat the treatment for best results.  SIDE EFFECTS: When using 5-fluorouracil/calcipotriene cream, you may have mild irritation, such as redness, dryness, swelling, or a mild burning sensation. This usually resolves within 2 weeks. The more actinic keratoses you have, the more redness and inflammation you can expect during treatment. Eye irritation has been reported rarely. If this occurs, please let us know.  If you have any trouble using this cream, please call the office. If you have any other questions about this information, please do not hesitate to ask me before you leave the office.  For open sores Use mupirocin and cover with bandaid until healed.      If you have any questions or concerns for your doctor, please call our main line at [phone] and press option 4 to reach your doctor's medical assistant. If no one answers, please leave a voicemail as directed and we will return your call as soon as possible. Messages left after 4 pm will be answered the following business day.   You may also send [place] a message via [place]. We typically respond to MyChart messages within 1-2 business days.  For prescription refills, please ask your pharmacy to contact our office. Our fax number is [phone].  If you have an urgent issue when the clinic is closed that cannot wait until the next business day, you can page your doctor at the number below.    Please note that while we do our best to be available for urgent issues outside of office hours, we are not available 24/7.   If you have an urgent issue and are unable to reach [place], you may choose to seek medical care at your doctor's office, retail clinic, urgent care center, or emergency room.  If  you have a medical emergency, please immediately call 911 or go to the emergency department.  Pager Numbers  - Dr. Nehemiah Massed: [phone]  - Dr. Laurence Ferrari: [phone]  - Dr. Nicole Kindred: [phone]  In the event of inclement weather, please call our main line at [phone] for an update on the status of any delays or closures.  Dermatology Medication Tips: Please keep the boxes that topical medications come in in order to help keep track of the instructions about where and how to use these. Pharmacies typically print the medication instructions only on the boxes and not directly on the medication tubes.   If your medication is too expensive, please contact our office at [phone] option 4 or send [place] a message through [place].   We are unable to tell what your co-pay for medications will be in advance as this is different depending on your insurance coverage. However, we may be able to find a  substitute medication at lower cost or fill out paperwork to get insurance to cover a needed medication.   If a prior authorization is required to get your medication covered by your insurance company, please allow [place] 1-2 business days to complete this process.  Drug prices often vary depending on where the prescription is filled and some pharmacies may offer cheaper prices.  The website www.goodrx.com contains coupons for medications through different pharmacies. The prices here do not account for what the cost may be with help from insurance (it may be cheaper with your insurance), but the website can give you the price if you did not use any insurance.  - You can print the associated coupon and take it with your prescription to the pharmacy.  - You may also stop by our office during regular business hours and pick up a GoodRx coupon card.  - If you need your prescription sent electronically to a different pharmacy, notify our office through [facility] or by phone at [phone] option 4.

## 2021-04-10 ENCOUNTER — Encounter: Payer: Self-pay | Admitting: Dermatology

## 2021-04-12 ENCOUNTER — Telehealth: Payer: Self-pay

## 2021-04-12 NOTE — Telephone Encounter (Signed)
-----   Message from [place], MD sent at 04/10/2021  8:53 PM EDT ----- Skin , right nasal sidewall ACTINIC KERATOSIS WITH FEATURES OF A VERRUCA, BASE INVOLVED --> Once completely healed from biopsy, recommend treating with 5FU/calcipotriene twice a day for 4 days (patient has at home). Otherwise could freeze in clinic if she prefers.  MAs please call. Thank you!

## 2021-04-12 NOTE — Telephone Encounter (Signed)
Tried calling patient regarding biopsy results. No answer. Left message on machine for patient to call office.

## 2021-04-13 ENCOUNTER — Telehealth: Payer: Self-pay

## 2021-04-13 NOTE — Telephone Encounter (Signed)
Patient advised of biopsy results.

## 2021-04-13 NOTE — Telephone Encounter (Signed)
-----   Message from [place], MD sent at 04/10/2021  8:53 PM EDT ----- Skin , right nasal sidewall ACTINIC KERATOSIS WITH FEATURES OF A VERRUCA, BASE INVOLVED --> Once completely healed from biopsy, recommend treating with 5FU/calcipotriene twice a day for 4 days (patient has at home). Otherwise could freeze in clinic if she prefers.  MAs please call. Thank you!

## 2021-05-15 ENCOUNTER — Other Ambulatory Visit: Payer: Self-pay | Admitting: Physician Assistant

## 2021-05-15 DIAGNOSIS — R202 Paresthesia of skin: Secondary | ICD-10-CM

## 2021-05-15 DIAGNOSIS — M5412 Radiculopathy, cervical region: Secondary | ICD-10-CM

## 2021-05-16 ENCOUNTER — Other Ambulatory Visit: Payer: Self-pay

## 2021-05-16 ENCOUNTER — Ambulatory Visit
Admission: RE | Admit: 2021-05-16 | Discharge: 2021-05-16 | Disposition: A | Discharge status: Home / Self Care | Payer: Medicare Other | Source: Ambulatory Visit | Attending: Physician Assistant | Admitting: Physician Assistant

## 2021-05-16 DIAGNOSIS — R202 Paresthesia of skin: Secondary | ICD-10-CM

## 2021-05-16 DIAGNOSIS — M5412 Radiculopathy, cervical region: Secondary | ICD-10-CM | POA: Diagnosis present

## 2021-05-16 MED ORDER — GADOBUTROL 1 MMOL/ML IV SOLN
8.0000 mL | Freq: Once | INTRAVENOUS | Status: AC | PRN
  Administered 2021-05-16: 8 mL via INTRAVENOUS

## 2021-05-24 ENCOUNTER — Other Ambulatory Visit: Payer: Self-pay

## 2021-05-24 ENCOUNTER — Ambulatory Visit: Payer: Medicare Other | Admitting: Dermatology

## 2021-05-24 DIAGNOSIS — J449 Chronic obstructive pulmonary disease, unspecified: Secondary | ICD-10-CM | POA: Insufficient documentation

## 2021-05-24 DIAGNOSIS — D1800 Hemangioma unspecified site: Secondary | ICD-10-CM

## 2021-05-24 DIAGNOSIS — Z1283 Encounter for screening for malignant neoplasm of skin: Secondary | ICD-10-CM | POA: Diagnosis not present

## 2021-05-24 DIAGNOSIS — E559 Vitamin D deficiency, unspecified: Secondary | ICD-10-CM | POA: Insufficient documentation

## 2021-05-24 DIAGNOSIS — J309 Allergic rhinitis, unspecified: Secondary | ICD-10-CM | POA: Insufficient documentation

## 2021-05-24 DIAGNOSIS — L814 Other melanin hyperpigmentation: Secondary | ICD-10-CM

## 2021-05-24 DIAGNOSIS — L820 Inflamed seborrheic keratosis: Secondary | ICD-10-CM | POA: Diagnosis not present

## 2021-05-24 DIAGNOSIS — L570 Actinic keratosis: Secondary | ICD-10-CM

## 2021-05-24 DIAGNOSIS — K635 Polyp of colon: Secondary | ICD-10-CM | POA: Insufficient documentation

## 2021-05-24 DIAGNOSIS — L578 Other skin changes due to chronic exposure to nonionizing radiation: Secondary | ICD-10-CM

## 2021-05-24 DIAGNOSIS — K219 Gastro-esophageal reflux disease without esophagitis: Secondary | ICD-10-CM | POA: Insufficient documentation

## 2021-05-24 DIAGNOSIS — N815 Vaginal enterocele: Secondary | ICD-10-CM | POA: Insufficient documentation

## 2021-05-24 DIAGNOSIS — D099 Carcinoma in situ, unspecified: Secondary | ICD-10-CM

## 2021-05-24 DIAGNOSIS — D0472 Carcinoma in situ of skin of left lower limb, including hip: Secondary | ICD-10-CM

## 2021-05-24 DIAGNOSIS — Z85828 Personal history of other malignant neoplasm of skin: Secondary | ICD-10-CM

## 2021-05-24 DIAGNOSIS — D229 Melanocytic nevi, unspecified: Secondary | ICD-10-CM

## 2021-05-24 DIAGNOSIS — D492 Neoplasm of unspecified behavior of bone, soft tissue, and skin: Secondary | ICD-10-CM

## 2021-05-24 DIAGNOSIS — L821 Other seborrheic keratosis: Secondary | ICD-10-CM

## 2021-05-24 HISTORY — DX: Carcinoma in situ, unspecified: D09.9

## 2021-05-24 NOTE — Patient Instructions (Addendum)
Wound Care Instructions  Cleanse wound gently with soap and water once a day then pat dry with clean gauze. Apply a thing coat of Mupirocin ointment over the wound (unless you have an allergy to this). We recommend that you use a new, sterile tube of Vaseline. Do not pick or remove scabs. Do not remove the yellow or white "healing tissue" from the base of the wound.  Cover the wound with fresh, clean, nonstick gauze and secure with paper tape. You may use Band-Aids in place of gauze and tape if the would is small enough, but would recommend trimming much of the tape off as there is often too much. Sometimes Band-Aids can irritate the skin.  You should call the office for your biopsy report after 1 week if you have not already been contacted.  If you experience any problems, such as abnormal amounts of bleeding, swelling, significant bruising, significant pain, or evidence of infection, please call the office immediately.  FOR ADULT SURGERY PATIENTS: If you need something for pain relief you may take 1 extra strength Tylenol (acetaminophen) AND 2 Ibuprofen (200mg  each) together every 4 hours as needed for pain. (do not take these if you are allergic to them or if you have a reason you should not take them.) Typically, you may only need pain medication for 1 to 3 days.      5-Fluorouracil/Calcipotriene Patient Education   Actinic keratoses are the dry, red scaly spots on the skin caused by sun damage. A portion of these spots can turn into skin cancer with time, and treating them can help prevent development of skin cancer.   Treatment of these spots requires removal of the defective skin cells. There are various ways to remove actinic keratoses, including freezing with liquid nitrogen, treatment with creams, or treatment with a blue light procedure in the office.   5-fluorouracil cream is a topical cream used to treat actinic keratoses. It works by interfering with the growth of abnormal  fast-growing skin cells, such as actinic keratoses. These cells peel off and are replaced by healthy ones.   5-fluorouracil/calcipotriene is a combination of the 5-fluorouracil cream with a vitamin D analog cream called calcipotriene. The calcipotriene alone does not treat actinic keratoses. However, when it is combined with 5-fluorouracil, it helps the 5-fluorouracil treat the actinic keratoses much faster so that the same results can be achieved with a much shorter treatment time.  INSTRUCTIONS FOR 5-FLUOROURACIL/CALCIPOTRIENE CREAM:   5-fluorouracil/calcipotriene cream typically only needs to be used for 4-7 days. A thin layer should be applied twice a day to the treatment areas recommended by your physician.   If your physician prescribed you separate tubes of 5-fluourouracil and calcipotriene, apply a thin layer of 5-fluorouracil followed by a thin layer of calcipotriene.   Avoid contact with your eyes, nostrils, and mouth. Do not use 5-fluorouracil/calcipotriene cream on infected or open wounds.   You will develop redness, irritation and some crusting at areas where you have pre-cancer damage/actinic keratoses. IF YOU DEVELOP PAIN, BLEEDING, OR SIGNIFICANT CRUSTING, STOP THE TREATMENT EARLY - you have already gotten a good response and the actinic keratoses should clear up well.  Wash your hands after applying 5-fluorouracil 5% cream on your skin.   A moisturizer or sunscreen with a minimum SPF 30 should be applied each morning.   Once you have finished the treatment, you can apply a thin layer of Vaseline twice a day to irritated areas to soothe and calm the areas more quickly.  If you experience significant discomfort, contact your physician.  For some patients it is necessary to repeat the treatment for best results.  SIDE EFFECTS: When using 5-fluorouracil/calcipotriene cream, you may have mild irritation, such as redness, dryness, swelling, or a mild burning sensation. This usually  resolves within 2 weeks. The more actinic keratoses you have, the more redness and inflammation you can expect during treatment. Eye irritation has been reported rarely. If this occurs, please let us know.  If you have any trouble using this cream, please call the office. If you have any other questions about this information, please do not hesitate to ask me before you leave the office.   Recommend taking Heliocare sun protection supplement daily in sunny weather for additional sun protection. For maximum protection on the sunniest days, you can take up to 2 capsules of regular Heliocare OR take 1 capsule of Heliocare Ultra. For prolonged exposure (such as a full day in the sun), you can repeat your dose of the supplement 4 hours after your first dose. Heliocare can be purchased at [facility] or at [email or web address].     Cryotherapy Aftercare  Wash gently with soap and water everyday.   Apply Vaseline and Band-Aid daily until healed.     If you have any questions or concerns for your doctor, please call our main line at [phone] and press option 4 to reach your doctor's medical assistant. If no one answers, please leave a voicemail as directed and we will return your call as soon as possible. Messages left after 4 pm will be answered the following business day.   You may also send [place] a message via [place]. We typically respond to MyChart messages within 1-2 business days.  For prescription refills, please ask your pharmacy to contact our office. Our fax number is [phone].  If you have an urgent issue when the clinic is closed that cannot wait until the next business day, you can page your doctor at the number below.    Please note that while we do our best to be available for urgent issues outside of office hours, we are not available 24/7.   If you have an urgent issue and are unable to reach [place], you may choose to seek medical care at your doctor's office, retail clinic,  urgent care center, or emergency room.  If you have a medical emergency, please immediately call 911 or go to the emergency department.  Pager Numbers  - Dr. Nehemiah Massed: [phone]  - Dr. Laurence Ferrari: [phone]  - Dr. Nicole Kindred: [phone]  In the event of inclement weather, please call our main line at [phone] for an update on the status of any delays or closures.  Dermatology Medication Tips: Please keep the boxes that topical medications come in in order to help keep track of the instructions about where and how to use these. Pharmacies typically print the medication instructions only on the boxes and not directly on the medication tubes.   If your medication is too expensive, please contact our office at [phone] option 4 or send [place] a message through [place].   We are unable to tell what your co-pay for medications will be in advance as this is different depending on your insurance coverage. However, we may be able to find a substitute medication at lower cost or fill out paperwork to get insurance to cover a needed medication.   If a prior authorization is required to get your medication covered by your insurance company, please  allow [place] 1-2 business days to complete this process.  Drug prices often vary depending on where the prescription is filled and some pharmacies may offer cheaper prices.  The website www.goodrx.com contains coupons for medications through different pharmacies. The prices here do not account for what the cost may be with help from insurance (it may be cheaper with your insurance), but the website can give you the price if you did not use any insurance.  - You can print the associated coupon and take it with your prescription to the pharmacy.  - You may also stop by our office during regular business hours and pick up a GoodRx coupon card.  - If you need your prescription sent electronically to a different pharmacy, notify our office through [facility] or by phone at [phone] option 4.

## 2021-05-24 NOTE — Progress Notes (Signed)
Follow-Up Visit   Subjective  Katelyn Ball is a 75 y.o. female who presents for the following: Follow-up (Mole check full body). Hx of SCC, Hx of Aks The patient presents for Total-Body Skin Exam (TBSE) for skin cancer screening and mole check.   The following portions of the chart were reviewed this encounter and updated as appropriate:   Tobacco  Allergies  Meds  Problems  Med Hx  Surg Hx  Fam Hx      Review of Systems:  No other skin or systemic complaints except as noted in HPI or Assessment and Plan.  Objective  Well appearing patient in no apparent distress; mood and affect are within normal limits.  A full examination was performed including scalp, head, eyes, ears, nose, lips, neck, chest, axillae, abdomen, back, buttocks, bilateral upper extremities, bilateral lower extremities, hands, feet, fingers, toes, fingernails, and toenails. All findings within normal limits unless otherwise noted below.  right nasal side wall, right eyebrow, right cheek, left elbow Pink scaly patch with scar at right nasal wall, pink scaly patches at right eyebrow, right cheek, left elbow   left leg x 4, right leg x 6, right upper back x 1  (11) (10) Erythematous keratotic or waxy stuck-on papule or plaque.   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 They are chronic with expected duration over 1 year. A portion of actinic keratoses will progress to squamous cell carcinoma of the skin. It is not possible to reliably predict which spots will progress to skin cancer and so treatment is recommended to prevent development of skin cancer.  Recommend daily broad  spectrum sunscreen SPF 30+ to sun-exposed areas, reapply every 2 hours as needed.  Recommend staying in the shade or wearing long sleeves, sun glasses (UVA+UVB protection) and wide brim hats (4-inch brim around the entire circumference of the hat). Call for new or changing lesions.   Start prescription 5-fluorouracil/calcipotriene cream twice a day for 4 - 7 days to affected areas including right nasal side wall, right eyebrow, right cheek, left elbow. Reviewed expected reaction. Stop early for pain or bleeding.  Inflamed seborrheic keratosis left leg x 4, right leg x 6, right upper back x 1  (11)  Symptomatic  Prior to procedure, discussed risks of blister formation, small wound, skin dyspigmentation, or rare scar following cryotherapy. Recommend Vaseline ointment to treated areas while healing.   Destruction of lesion - left leg x 4, right leg x 6, right upper back x 1  (11) Complexity: simple   Destruction method: cryotherapy   Informed consent: discussed and consent obtained   Timeout:  patient name, date of birth, surgical site, and procedure verified Lesion destroyed using liquid nitrogen: Yes   Region frozen until ice ball extended beyond lesion: Yes   Outcome: patient tolerated procedure well with no complications   Post-procedure details: wound care instructions given    Neoplasm of skin Left posterior calf inferior to Popliteal Fossa  Epidermal / dermal shaving  Lesion diameter (cm):  0.5 Informed consent: discussed and consent obtained   Timeout: patient name, date of birth, surgical site, and procedure verified   Patient  was prepped and draped in usual sterile fashion: area prepped with alcohol. Anesthesia: the lesion was anesthetized in a standard fashion   Anesthetic:  1% lidocaine w/ epinephrine 1-100,000 local infiltration Instrument used: flexible razor blade   Hemostasis achieved with: pressure, aluminum chloride and electrodesiccation   Outcome: patient  tolerated procedure well   Post-procedure details: wound care instructions given   Post-procedure details comment:  Ointment and a small bandage applied  Specimen 1 - Surgical pathology Differential Diagnosis: R/O Atypia   Check Margins: No  Lentigines - Scattered tan macules - Due to sun exposure - Benign-appering, observe - Recommend daily broad spectrum sunscreen SPF 30+ to sun-exposed areas, reapply every 2 hours as needed. - Call for any changes  Seborrheic Keratoses - Stuck-on, waxy, tan-brown papules and/or plaques  - Benign-appearing - Discussed benign etiology and prognosis. - Observe - Call for any changes  Melanocytic Nevi - Tan-brown and/or pink-flesh-colored symmetric macules and papules - Benign appearing on exam today - Observation - Call clinic for new or changing moles - Recommend daily use of broad spectrum spf 30+ sunscreen to sun-exposed areas.   Hemangiomas - Red papules - Discussed benign nature - Observe - Call for any changes  Actinic Damage - Chronic condition, secondary to cumulative UV/sun exposure - diffuse scaly erythematous macules with underlying dyspigmentation - Recommend daily broad spectrum sunscreen SPF 30+ to sun-exposed areas, reapply every 2 hours as needed.  - Staying in the shade or wearing long sleeves, sun glasses (UVA+UVB protection) and wide brim hats (4-inch brim around the entire circumference of the hat) are also recommended for sun protection.  - Call for new or changing lesions.  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 - Call if any new or changing lesions are noted between office visits  Skin cancer screening performed today.   Return in about 6 months (around 11/24/2021).  I,  Marye Round, CMA, am acting as scribe for Forest Gleason, MD .   Documentation: I have reviewed the above documentation for accuracy and completeness, and I agree with the above.  Forest Gleason, MD

## 2021-06-02 ENCOUNTER — Other Ambulatory Visit: Payer: Self-pay | Admitting: Neurology

## 2021-06-02 DIAGNOSIS — R200 Anesthesia of skin: Secondary | ICD-10-CM

## 2021-06-02 DIAGNOSIS — R202 Paresthesia of skin: Secondary | ICD-10-CM

## 2021-06-05 ENCOUNTER — Telehealth: Payer: Self-pay

## 2021-06-05 NOTE — Telephone Encounter (Signed)
Patient advised bx results showed SCCis, scheduled for EDC.Cherly Hensen

## 2021-06-05 NOTE — Telephone Encounter (Signed)
-----   Message from Alfonso Patten, MD sent at 06/04/2021  9:54 PM EDT ----- Diagnosis Skin , left posterior calf inferior to popliteal fossa SQUAMOUS CELL CARCINOMA IN SITU, CLOSE TO MARGIN --> ED&C  MAs please call and schedule. Thank you!

## 2021-06-07 ENCOUNTER — Ambulatory Visit
Admission: RE | Admit: 2021-06-07 | Discharge: 2021-06-07 | Disposition: A | Discharge status: Home / Self Care | Payer: Medicare Other | Source: Ambulatory Visit | Attending: Neurology | Admitting: Neurology

## 2021-06-07 ENCOUNTER — Other Ambulatory Visit: Payer: Self-pay

## 2021-06-07 DIAGNOSIS — R202 Paresthesia of skin: Secondary | ICD-10-CM | POA: Insufficient documentation

## 2021-06-07 DIAGNOSIS — R200 Anesthesia of skin: Secondary | ICD-10-CM

## 2021-06-07 DIAGNOSIS — G459 Transient cerebral ischemic attack, unspecified: Secondary | ICD-10-CM | POA: Insufficient documentation

## 2021-06-13 ENCOUNTER — Encounter: Payer: Self-pay | Admitting: Dermatology

## 2021-06-29 ENCOUNTER — Telehealth: Payer: Self-pay | Admitting: Dermatology

## 2021-07-18 ENCOUNTER — Encounter: Payer: Self-pay | Admitting: Dermatology

## 2021-07-18 ENCOUNTER — Other Ambulatory Visit: Payer: Self-pay

## 2021-07-18 ENCOUNTER — Ambulatory Visit: Payer: Medicare Other | Admitting: Dermatology

## 2021-07-18 DIAGNOSIS — D0472 Carcinoma in situ of skin of left lower limb, including hip: Secondary | ICD-10-CM | POA: Diagnosis not present

## 2021-07-18 DIAGNOSIS — L570 Actinic keratosis: Secondary | ICD-10-CM | POA: Diagnosis not present

## 2021-07-18 DIAGNOSIS — D099 Carcinoma in situ, unspecified: Secondary | ICD-10-CM

## 2021-07-18 DIAGNOSIS — I8310 Varicose veins of unspecified lower extremity with inflammation: Secondary | ICD-10-CM | POA: Diagnosis not present

## 2021-07-18 NOTE — Patient Instructions (Signed)
Start 5FU/calcipotriene daily x 3 weeks to affected area at back of left knee.  5-fluorouracil/calcipotriene cream is is a type of field treatment used to treat precancers, thin skin cancers, and areas of sun damage. Reviewed expected reaction including irritation and mild inflammation potentially progressing to more severe inflammation including redness, scaling, crusting and open sores/erosions.  Reviewed if too much irritation occurs, ensure application of only a thin layer and decrease frequency of use to achieve a tolerable level of inflammation. Recommend applying Vaseline ointment to open sores as needed.  Minimize sun exposure while under treatment. Recommend daily broad spectrum sunscreen SPF 30+ to sun-exposed areas, reapply every 2 hours as needed.   If you have any questions or concerns for your doctor, please call our main line at [phone] and press option 4 to reach your doctor's medical assistant. If no one answers, please leave a voicemail as directed and we will return your call as soon as possible. Messages left after 4 pm will be answered the following business day.   You may also send [place] a message via [place]. We typically respond to MyChart messages within 1-2 business days.  For prescription refills, please ask your pharmacy to contact our office. Our fax number is [phone].  If you have an urgent issue when the clinic is closed that cannot wait until the next business day, you can page your doctor at the number below.    Please note that while we do our best to be available for urgent issues outside of office hours, we are not available 24/7.   If you have an urgent issue and are unable to reach [place], you may choose to seek medical care at your doctor's office, retail clinic, urgent care center, or emergency room.  If you have a medical emergency, please immediately call 911 or go to the emergency department.  Pager Numbers  - Dr. Nehemiah Massed: [phone]  - Dr. Laurence Ferrari:  [phone]  - Dr. Nicole Kindred: [phone]  In the event of inclement weather, please call our main line at [phone] for an update on the status of any delays or closures.  Dermatology Medication Tips: Please keep the boxes that topical medications come in in order to help keep track of the instructions about where and how to use these. Pharmacies typically print the medication instructions only on the boxes and not directly on the medication tubes.   If your medication is too expensive, please contact our office at [phone] option 4 or send [place] a message through [place].   We are unable to tell what your co-pay for medications will be in advance as this is different depending on your insurance coverage. However, we may be able to find a substitute medication at lower cost or fill out paperwork to get insurance to cover a needed medication.   If a prior authorization is required to get your medication covered by your insurance company, please allow [place] 1-2 business days to complete this process.  Drug prices often vary depending on where the prescription is filled and some pharmacies may offer cheaper prices.  The website www.goodrx.com contains coupons for medications through different pharmacies. The prices here do not account for what the cost may be with help from insurance (it may be cheaper with your insurance), but the website can give you the price if you did not use any insurance.  - You can print the associated coupon and take it with your prescription to the pharmacy.  - You may also stop  by our office during regular business hours and pick up a GoodRx coupon card.  - If you need your prescription sent electronically to a different pharmacy, notify our office through [facility] or by phone at [phone] option 4.

## 2021-07-18 NOTE — Progress Notes (Signed)
   Follow-Up Visit   Subjective  Katelyn Ball is a 75 y.o. female who presents for the following: Procedure (Patient here today to treat SCCis at left posterior calf inferior to popliteal fossa with EDC. Patient would like to discuss excision instead of doing EDC. ).   The following portions of the chart were reviewed this encounter and updated as appropriate:   Tobacco  Allergies  Meds  Problems  Med Hx  Surg Hx  Fam Hx      Review of Systems:  No other skin or systemic complaints except as noted in HPI or Assessment and Plan.  Objective  Well appearing patient in no apparent distress; mood and affect are within normal limits.  A focused examination was performed including legs. Relevant physical exam findings are noted in the Assessment and Plan.  Left posterior calf inferior to popliteal fossa Pink to violaceous thin plaque  Left lateral calf Erythematous thin papules/macules with gritty scale.    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 No evidence of residual on exam today. Discussed option of topical therapy. Reviewed increased risk of recurrence compared to [facility] or excision but this is reasonable given minimal to no residual visible on exam today.   Start prescription 5FU/calcipotriene (pt has this version at home) daily x 3 weeks to affected area.   Will recheck at follow-up.    AK (actinic keratosis) Left lateral calf  Hypertrophic  If not clear after LN2, consider biopsy  Destruction of lesion - Left lateral calf  Destruction method: cryotherapy   Informed consent: discussed and consent obtained   Lesion destroyed using liquid nitrogen: Yes   Cryotherapy cycles:  2 Outcome: patient tolerated procedure well with no complications   Post-procedure details: wound care instructions given    Varicose Veins/Spider Veins -  Dilated blue, purple or red veins at the lower extremities - Reassured - Smaller vessels can be treated by sclerotherapy (a procedure to inject a medicine into the veins to make them disappear) if desired, but the treatment is not covered by insurance. Larger vessels may be covered if symptomatic and we would refer to vascular surgeon if treatment desired.  Return for 6-8 weeks, sclerotherapy with Dr. Nehemiah Massed.  Graciella Belton, RMA, am acting as scribe for Forest Gleason, MD .  Documentation: I have reviewed the above documentation for accuracy and completeness, and I agree with the above.  Forest Gleason, MD

## 2021-09-21 ENCOUNTER — Encounter: Payer: Self-pay | Admitting: Dermatology

## 2021-09-21 ENCOUNTER — Ambulatory Visit: Payer: Medicare Other | Admitting: Dermatology

## 2021-09-21 ENCOUNTER — Other Ambulatory Visit: Payer: Self-pay

## 2021-09-21 DIAGNOSIS — L570 Actinic keratosis: Secondary | ICD-10-CM | POA: Diagnosis not present

## 2021-09-21 DIAGNOSIS — L578 Other skin changes due to chronic exposure to nonionizing radiation: Secondary | ICD-10-CM

## 2021-09-21 DIAGNOSIS — D485 Neoplasm of uncertain behavior of skin: Secondary | ICD-10-CM

## 2021-09-21 DIAGNOSIS — C4492 Squamous cell carcinoma of skin, unspecified: Secondary | ICD-10-CM | POA: Insufficient documentation

## 2021-09-21 HISTORY — DX: Squamous cell carcinoma of skin, unspecified: C44.92

## 2021-09-21 NOTE — Patient Instructions (Addendum)
Cryotherapy Aftercare  Wash gently with soap and water everyday.   Apply Vaseline and Band-Aid daily until healed.   Prior to procedure, discussed risks of blister formation, small wound, skin dyspigmentation, or rare scar following cryotherapy. Recommend Vaseline ointment to treated areas while healing.  Wound Care Instructions  Cleanse wound gently with soap and water once a day then pat dry with clean gauze. Apply a thing coat of Petrolatum (petroleum jelly, "Vaseline") over the wound (unless you have an allergy to this). We recommend that you use a new, sterile tube of Vaseline. Do not pick or remove scabs. Do not remove the yellow or white "healing tissue" from the base of the wound.  Cover the wound with fresh, clean, nonstick gauze and secure with paper tape. You may use Band-Aids in place of gauze and tape if the would is small enough, but would recommend trimming much of the tape off as there is often too much. Sometimes Band-Aids can irritate the skin.  You should call the office for your biopsy report after 1 week if you have not already been contacted.  If you experience any problems, such as abnormal amounts of bleeding, swelling, significant bruising, significant pain, or evidence of infection, please call the office immediately.  FOR ADULT SURGERY PATIENTS: If you need something for pain relief you may take 1 extra strength Tylenol (acetaminophen) AND 2 Ibuprofen (200mg  each) together every 4 hours as needed for pain. (do not take these if you are allergic to them or if you have a reason you should not take them.) Typically, you may only need pain medication for 1 to 3 days.   - Start 5-fluorouracil/calcipotriene cream twice a day for 7 days to affected areas including chest. Patient provided with handout reviewing treatment course and side effects and advised to call or message [place] on MyChart with any concerns.  Recommend daily broad spectrum sunscreen SPF 30+ to sun-exposed  areas, reapply every 2 hours as needed. Call for new or changing lesions.  Staying in the shade or wearing long sleeves, sun glasses (UVA+UVB protection) and wide brim hats (4-inch brim around the entire circumference of the hat) are also recommended for sun protection.   If you have any questions or concerns for your doctor, please call our main line at [phone] and press option 4 to reach your doctor's medical assistant. If no one answers, please leave a voicemail as directed and we will return your call as soon as possible. Messages left after 4 pm will be answered the following business day.   You may also send [place] a message via [place]. We typically respond to MyChart messages within 1-2 business days.  For prescription refills, please ask your pharmacy to contact our office. Our fax number is [phone].  If you have an urgent issue when the clinic is closed that cannot wait until the next business day, you can page your doctor at the number below.    Please note that while we do our best to be available for urgent issues outside of office hours, we are not available 24/7.   If you have an urgent issue and are unable to reach [place], you may choose to seek medical care at your doctor's office, retail clinic, urgent care center, or emergency room.  If you have a medical emergency, please immediately call 911 or go to the emergency department.  Pager Numbers  - Dr. Nehemiah Massed: [phone]  - Dr. Laurence Ferrari: [phone]  - Dr. Nicole Kindred: [phone]  In  the event of inclement weather, please call our main line at [phone] for an update on the status of any delays or closures.  Dermatology Medication Tips: Please keep the boxes that topical medications come in in order to help keep track of the instructions about where and how to use these. Pharmacies typically print the medication instructions only on the boxes and not directly on the medication tubes.   If your medication is too  expensive, please contact our office at [phone] option 4 or send [place] a message through [place].   We are unable to tell what your co-pay for medications will be in advance as this is different depending on your insurance coverage. However, we may be able to find a substitute medication at lower cost or fill out paperwork to get insurance to cover a needed medication.   If a prior authorization is required to get your medication covered by your insurance company, please allow [place] 1-2 business days to complete this process.  Drug prices often vary depending on where the prescription is filled and some pharmacies may offer cheaper prices.  The website www.goodrx.com contains coupons for medications through different pharmacies. The prices here do not account for what the cost may be with help from insurance (it may be cheaper with your insurance), but the website can give you the price if you did not use any insurance.  - You can print the associated coupon and take it with your prescription to the pharmacy.  - You may also stop by our office during regular business hours and pick up a GoodRx coupon card.  - If you need your prescription sent electronically to a different pharmacy, notify our office through [facility] or by phone at [phone] option 4.

## 2021-09-21 NOTE — Progress Notes (Signed)
Follow-Up Visit   Subjective  Katelyn Ball is a 75 y.o. female who presents for the following: Follow-up (Patient here today to recheck AK at left lateral calf and SCCis at left posterior calf inf to popliteal fossa. AK treated with LN2 at last visit, SCCis treated with 5FU/calcipotriene, patient advises SCCis not resolved. ).  Patient has spots at right arm and chest that have come up in the last few months. Spot at chest will bleed if she scratches it.  The following portions of the chart were reviewed this encounter and updated as appropriate:   Tobacco  Allergies  Meds  Problems  Med Hx  Surg Hx  Fam Hx      Review of Systems:  No other skin or systemic complaints except as noted in HPI or Assessment and Plan.  Objective  Well appearing patient in no apparent distress; mood and affect are within normal limits.  A focused examination was performed including arms, legs, chest. Relevant physical exam findings are noted in the Assessment and Plan.  Right Upper Arm 0.4 cm firm pink papule with central scale   chest x 2 (2) Erythematous thin papules/macules with gritty scale.    Assessment & Plan  Neoplasm of uncertain behavior of skin Right Upper Arm  Skin / nail biopsy Type of biopsy: tangential   Informed consent: discussed and consent obtained   Timeout: patient name, date of birth, surgical site, and procedure verified   Patient was prepped and draped in usual sterile fashion: Area prepped with isopropyl alcohol. Anesthesia: the lesion was anesthetized in a standard fashion   Anesthetic:  1% lidocaine w/ epinephrine 1-100,000 buffered w/ 8.4% NaHCO3 Instrument used: flexible razor blade   Hemostasis achieved with: aluminum chloride   Outcome: patient tolerated procedure well   Post-procedure details: wound care instructions given   Additional details:  Mupirocin and a bandage applied  Specimen 1 - Surgical pathology Differential Diagnosis: r/o SCC KAC  type  Check Margins: No 0.4 cm firm pink papule with central scale  Related Medications gentamicin ointment (GARAMYCIN) 0.1 % Apply 1 application topically daily.  AK (actinic keratosis) (2) chest x 2  Prior to procedure, discussed risks of blister formation, small wound, skin dyspigmentation, or rare scar following cryotherapy. Recommend Vaseline ointment to treated areas while healing.  Call if either spot grows instead of clearing.  Destruction of lesion - chest x 2  Destruction method: cryotherapy   Informed consent: discussed and consent obtained   Lesion destroyed using liquid nitrogen: Yes   Cryotherapy cycles:  2 Outcome: patient tolerated procedure well with no complications   Post-procedure details: wound care instructions given    Actinic Damage - Severe, confluent actinic changes with pre-cancerous actinic keratoses  - Severe, chronic, not at goal, secondary to cumulative UV radiation exposure over time - diffuse scaly erythematous macules and papules with underlying dyspigmentation - Discussed Prescription "Field Treatment" for Severe, Chronic Confluent Actinic Changes with Pre-Cancerous Actinic Keratoses Field treatment involves treatment of an entire area of skin that has confluent Actinic Changes (Sun/ Ultraviolet light damage) and PreCancerous Actinic Keratoses by method of PhotoDynamic Therapy (PDT) and/or prescription Topical Chemotherapy agents such as 5-fluorouracil, 5-fluorouracil/calcipotriene, and/or imiquimod.  The purpose is to decrease the number of clinically evident and subclinical PreCancerous lesions to prevent progression to development of skin cancer by chemically destroying early precancer changes that may or may not be visible.  It has been shown to reduce the risk of developing skin cancer in  the treated area. As a result of treatment, redness, scaling, crusting, and open sores may occur during treatment course. One or more than one of these methods  may be used and may have to be used several times to control, suppress and eliminate the PreCancerous changes. Discussed treatment course, expected reaction, and possible side effects. - Recommend daily broad spectrum sunscreen SPF 30+ to sun-exposed areas, reapply every 2 hours as needed.  - Staying in the shade or wearing long sleeves, sun glasses (UVA+UVB protection) and wide brim hats (4-inch brim around the entire circumference of the hat) are also recommended. - Call for new or changing lesions. - Start 5-fluorouracil/calcipotriene cream twice a day for 7 days to affected areas including chest. Patient provided with handout reviewing treatment course and side effects and advised to call or message [place] on MyChart with any concerns.  Return for as scheduled, TBSE.  Graciella Belton, RMA, am acting as scribe for Forest Gleason, MD .  Documentation: I have reviewed the above documentation for accuracy and completeness, and I agree with the above.  Forest Gleason, MD

## 2021-09-26 ENCOUNTER — Telehealth: Payer: Self-pay

## 2021-09-26 NOTE — Telephone Encounter (Signed)
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 Clinically this looks like a small SCC  MAs please call. Let me know if she has any questions for me. Thank you!

## 2021-10-11 ENCOUNTER — Telehealth: Payer: Self-pay

## 2021-10-11 NOTE — Telephone Encounter (Addendum)
  Patient returned call. Patient informed of biopsy results and need for treatment. Patient verbalized understanding and denied further questions. Patient scheduled for treatment on 12/28 at 8 am.    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 Clinically this looks like a small SCC  MAs please call. Let me know if she has any questions for me. Thank you!

## 2021-10-11 NOTE — Telephone Encounter (Addendum)
Tried calling patient regarding biopsy results. No answer. Left message for patient to return call.    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 Clinically this looks like a small SCC  MAs please call. Let me know if she has any questions for me. Thank you!

## 2021-11-02 ENCOUNTER — Ambulatory Visit: Payer: Self-pay | Admitting: Dermatology

## 2021-11-15 ENCOUNTER — Encounter: Payer: Self-pay | Admitting: Dermatology

## 2021-11-15 ENCOUNTER — Other Ambulatory Visit: Payer: Self-pay

## 2021-11-15 ENCOUNTER — Ambulatory Visit: Payer: Medicare Other | Admitting: Dermatology

## 2021-11-15 ENCOUNTER — Other Ambulatory Visit: Payer: Self-pay | Admitting: *Deleted

## 2021-11-15 DIAGNOSIS — Z87891 Personal history of nicotine dependence: Secondary | ICD-10-CM

## 2021-11-15 DIAGNOSIS — L578 Other skin changes due to chronic exposure to nonionizing radiation: Secondary | ICD-10-CM | POA: Diagnosis not present

## 2021-11-15 DIAGNOSIS — C44622 Squamous cell carcinoma of skin of right upper limb, including shoulder: Secondary | ICD-10-CM | POA: Diagnosis not present

## 2021-11-15 DIAGNOSIS — D485 Neoplasm of uncertain behavior of skin: Secondary | ICD-10-CM

## 2021-11-15 NOTE — Progress Notes (Signed)
Follow-Up Visit   Subjective  Katelyn Ball is a 75 y.o. 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 ).  The following portions of the chart were reviewed this encounter and updated as appropriate:   Tobacco   Allergies   Meds   Problems   Med Hx   Surg Hx   Fam Hx       Review of Systems:  No other skin or systemic complaints except as noted in HPI or Assessment and Plan.  Objective  Well appearing patient in no apparent distress; mood and affect are within normal limits.  A focused examination was performed including right arm. Relevant physical exam findings are noted in the Assessment and Plan.  Right Upper Arm 0.6 cm pink papule    Assessment & Plan  Neoplasm of uncertain behavior of skin Right Upper Arm  Epidermal / dermal shaving  Lesion diameter (cm):  0.6 Informed consent: discussed and consent obtained   Timeout: patient name, date of birth, surgical site, and procedure verified   Patient was prepped and draped in usual sterile fashion: area prepped with isopropyl alcohol. Anesthesia: the lesion was anesthetized in a standard fashion   Anesthetic:  1% lidocaine w/ epinephrine 1-100,000 buffered w/ 8.4% NaHCO3 Instrument used: flexible razor blade   Hemostasis achieved with: aluminum chloride   Outcome: patient tolerated procedure well   Post-procedure details: wound care instructions given   Additional details:  Mupirocin and a bandage applied  Destruction of lesion  Destruction method: electrodesiccation and curettage   Informed consent: discussed and consent obtained   Timeout:  patient name, date of birth, surgical site, and procedure verified Patient was prepped and draped in usual sterile fashion: area prepped with isopropyl alcohol. Anesthesia: the lesion was anesthetized in a  standard fashion   Anesthetic:  1% lidocaine w/ epinephrine 1-100,000 buffered w/ 8.4% NaHCO3 Curettage performed in three different directions: Yes   Electrodesiccation performed over the curetted area: Yes   Curettage cycles:  3 Final wound size (cm):  0.8 Hemostasis achieved with:  electrodesiccation Outcome: patient tolerated procedure well with no complications   Post-procedure details: wound care instructions given   Additional details:  Mupirocin and a pressure dressing applied  Specimen 1 - Surgical pathology Differential Diagnosis: r/o SCC, previous bx showed ATYPICAL SQUAMOUS PROLIFERATION, 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   Check Margins: No [id number]    Actinic Damage - Severe, confluent actinic changes with pre-cancerous actinic keratoses  - Severe, chronic, not at goal, secondary to cumulative UV radiation exposure over time - diffuse scaly erythematous macules and papules with underlying dyspigmentation - Discussed Prescription "Field Treatment" for Severe, Chronic Confluent Actinic Changes with Pre-Cancerous Actinic Keratoses Field treatment involves treatment of an entire area of skin that has confluent Actinic Changes (Sun/ Ultraviolet light damage) and PreCancerous Actinic Keratoses by method of PhotoDynamic Therapy (PDT) and/or prescription Topical Chemotherapy agents such as 5-fluorouracil, 5-fluorouracil/calcipotriene, and/or imiquimod.  The purpose is to decrease the number of clinically evident and subclinical PreCancerous lesions to prevent progression to development of skin cancer by chemically destroying early precancer changes that may or may not be visible.  It has been shown to reduce the risk of developing skin cancer in the treated area. As a result of treatment, redness, scaling, crusting, and open sores may occur during treatment course. One or more than  one of these methods may be used and may have to be used several times to control, suppress  and eliminate the PreCancerous changes. Discussed treatment course, expected reaction, and possible side effects. - Recommend daily broad spectrum sunscreen SPF 30+ to sun-exposed areas, reapply every 2 hours as needed.  - Staying in the shade or wearing long sleeves, sun glasses (UVA+UVB protection) and wide brim hats (4-inch brim around the entire circumference of the hat) are also recommended. - Call for new or changing lesions. - Start 5-fluorouracil/calcipotriene cream twice a day for 7 days to affected areas including chest.  Patient provided with handout reviewing treatment course and side effects and advised to call or message [place] on MyChart with any concerns.  Return for TBSE, as scheduled.  Graciella Belton, RMA, am acting as scribe for Forest Gleason, MD .  Documentation: I have reviewed the above documentation for accuracy and completeness, and I agree with the above.  Forest Gleason, MD

## 2021-11-15 NOTE — Patient Instructions (Addendum)
Wound Care Instructions  Cleanse wound gently with soap and water once a day then pat dry with clean gauze. Apply a thing coat of Petrolatum (petroleum jelly, "Vaseline") over the wound (unless you have an allergy to this). We recommend that you use a new, sterile tube of Vaseline. Do not pick or remove scabs. Do not remove the yellow or white "healing tissue" from the base of the wound.  Cover the wound with fresh, clean, nonstick gauze and secure with paper tape. You may use Band-Aids in place of gauze and tape if the would is small enough, but would recommend trimming much of the tape off as there is often too much. Sometimes Band-Aids can irritate the skin.  You should call the office for your biopsy report after 1 week if you have not already been contacted.  If you experience any problems, such as abnormal amounts of bleeding, swelling, significant bruising, significant pain, or evidence of infection, please call the office immediately.  FOR ADULT SURGERY PATIENTS: If you need something for pain relief you may take 1 extra strength Tylenol (acetaminophen) AND 2 Ibuprofen (200mg  each) together every 4 hours as needed for pain. (do not take these if you are allergic to them or if you have a reason you should not take them.) Typically, you may only need pain medication for 1 to 3 days.   5-Fluorouracil/Calcipotriene Patient Education   Actinic keratoses are the dry, red scaly spots on the skin caused by sun damage. A portion of these spots can turn into skin cancer with time, and treating them can help prevent development of skin cancer.   Treatment of these spots requires removal of the defective skin cells. There are various ways to remove actinic keratoses, including freezing with liquid nitrogen, treatment with creams, or treatment with a blue light procedure in the office.   5-fluorouracil cream is a topical cream used to treat actinic keratoses. It works by interfering with the growth of  abnormal fast-growing skin cells, such as actinic keratoses. These cells peel off and are replaced by healthy ones.   5-fluorouracil/calcipotriene is a combination of the 5-fluorouracil cream with a vitamin D analog cream called calcipotriene. The calcipotriene alone does not treat actinic keratoses. However, when it is combined with 5-fluorouracil, it helps the 5-fluorouracil treat the actinic keratoses much faster so that the same results can be achieved with a much shorter treatment time.  INSTRUCTIONS FOR 5-FLUOROURACIL/CALCIPOTRIENE CREAM:   5-fluorouracil/calcipotriene cream typically only needs to be used for 7 days. A thin layer should be applied twice a day to the treatment areas recommended by your physician.   If your physician prescribed you separate tubes of 5-fluourouracil and calcipotriene, apply a thin layer of 5-fluorouracil followed by a thin layer of calcipotriene.   Avoid contact with your eyes, nostrils, and mouth. Do not use 5-fluorouracil/calcipotriene cream on infected or open wounds.   You will develop redness, irritation and some crusting at areas where you have pre-cancer damage/actinic keratoses. IF YOU DEVELOP PAIN, BLEEDING, OR SIGNIFICANT CRUSTING, STOP THE TREATMENT EARLY - you have already gotten a good response and the actinic keratoses should clear up well.  Wash your hands after applying 5-fluorouracil 5% cream on your skin.   A moisturizer or sunscreen with a minimum SPF 30 should be applied each morning.   Once you have finished the treatment, you can apply a thin layer of Vaseline twice a day to irritated areas to soothe and calm the areas more quickly. If  you experience significant discomfort, contact your physician.  For some patients it is necessary to repeat the treatment for best results.  SIDE EFFECTS: When using 5-fluorouracil/calcipotriene cream, you may have mild irritation, such as redness, dryness, swelling, or a mild burning sensation. This  usually resolves within 2 weeks. The more actinic keratoses you have, the more redness and inflammation you can expect during treatment. Eye irritation has been reported rarely. If this occurs, please let us know.  If you have any trouble using this cream, please call the office. If you have any other questions about this information, please do not hesitate to ask me before you leave the office.  If You Need Anything After Your Visit  If you have any questions or concerns for your doctor, please call our main line at [phone] and press option 4 to reach your doctor's medical assistant. If no one answers, please leave a voicemail as directed and we will return your call as soon as possible. Messages left after 4 pm will be answered the following business day.   You may also send [place] a message via [place]. We typically respond to MyChart messages within 1-2 business days.  For prescription refills, please ask your pharmacy to contact our office. Our fax number is [phone].  If you have an urgent issue when the clinic is closed that cannot wait until the next business day, you can page your doctor at the number below.    Please note that while we do our best to be available for urgent issues outside of office hours, we are not available 24/7.   If you have an urgent issue and are unable to reach [place], you may choose to seek medical care at your doctor's office, retail clinic, urgent care center, or emergency room.  If you have a medical emergency, please immediately call 911 or go to the emergency department.  Pager Numbers  - Dr. Nehemiah Massed: [phone]  - Dr. Laurence Ferrari: [phone]  - Dr. Nicole Kindred: [phone]  In the event of inclement weather, please call our main line at [phone] for an update on the status of any delays or closures.  Dermatology Medication Tips: Please keep the boxes that topical medications come in in order to help keep track of the instructions about where and  how to use these. Pharmacies typically print the medication instructions only on the boxes and not directly on the medication tubes.   If your medication is too expensive, please contact our office at [phone] option 4 or send [place] a message through [place].   We are unable to tell what your co-pay for medications will be in advance as this is different depending on your insurance coverage. However, we may be able to find a substitute medication at lower cost or fill out paperwork to get insurance to cover a needed medication.   If a prior authorization is required to get your medication covered by your insurance company, please allow [place] 1-2 business days to complete this process.  Drug prices often vary depending on where the prescription is filled and some pharmacies may offer cheaper prices.  The website www.goodrx.com contains coupons for medications through different pharmacies. The prices here do not account for what the cost may be with help from insurance (it may be cheaper with your insurance), but the website can give you the price if you did not use any insurance.  - You can print the associated coupon and take it with your prescription to the pharmacy.  -  You may also stop by our office during regular business hours and pick up a GoodRx coupon card.  - If you need your prescription sent electronically to a different pharmacy, notify our office through [facility] or by phone at [phone] option 4.     Si Usted Necesita Algo Despus de Su Visita  Tambin puede enviarnos un mensaje a travs de Pharmacist, community. Por lo general respondemos a los mensajes de MyChart en el transcurso de 1 a 2 das hbiles.  Para renovar recetas, por favor pida a su farmacia que se ponga en contacto con nuestra oficina. Harland Dingwall de fax es [place] [phone].  Si tiene un asunto urgente cuando la clnica est cerrada y que no puede esperar hasta el siguiente da hbil, puede llamar/localizar a su  doctor(a) al nmero que aparece a continuacin.   Por favor, tenga en cuenta que aunque hacemos todo lo posible para estar disponibles para asuntos urgentes fuera del horario de [place], no estamos disponibles las 24 horas del da, los 7 das de la [place].   Si tiene un problema urgente y no puede comunicarse con nosotros, puede optar por buscar atencin mdica  en el consultorio de su doctor(a), en una clnica privada, en un centro de atencin urgente o en una sala de emergencias.  Si tiene Engineering geologist, por favor llame inmediatamente al 911 o vaya a la sala de emergencias.  Nmeros de bper  - Dr. Nehemiah Massed: [phone]  - Dra. Moye: [phone]  - Dra. Nicole Kindred: [phone]  En caso de inclemencias del [place], por favor llame a Johnsie Kindred principal al [phone] para una actualizacin sobre el [place] de cualquier retraso o cierre.  Consejos para la medicacin en dermatologa: Por favor, guarde las cajas en las que vienen los medicamentos de uso tpico para ayudarle a seguir las instrucciones sobre dnde y cmo usarlos. Las farmacias generalmente imprimen las instrucciones del medicamento slo en las cajas y no directamente en los tubos del [place].   Si su medicamento es muy caro, por favor, pngase en contacto con Zigmund Daniel llamando al [phone] y presione la opcin 4 o envenos un mensaje a travs de Pharmacist, community.   No podemos decirle cul ser su copago por los medicamentos por adelantado ya que esto es diferente dependiendo de la cobertura de su seguro. Sin embargo, es posible que podamos encontrar un medicamento sustituto a Electrical engineer un formulario para que el seguro cubra el medicamento que se considera necesario.   Si se requiere una autorizacin previa para que su compaa de seguros [place] su medicamento, por favor permtanos de 1 a 2 das hbiles para completar este proceso.  Los precios de los medicamentos varan con frecuencia dependiendo del  Environmental consultant de dnde se surte la receta y alguna farmacias pueden ofrecer precios ms baratos.  El sitio web www.goodrx.com tiene cupones para medicamentos de Airline pilot. Los precios aqu no tienen en cuenta lo que podra costar con la ayuda del seguro (puede ser ms barato con su seguro), pero el sitio web puede darle el precio si no utiliz Research scientist (physical sciences).  - Puede imprimir el cupn correspondiente y llevarlo con su receta a la farmacia.  - Tambin puede pasar por nuestra oficina durante el horario de atencin regular y Charity fundraiser una tarjeta de cupones de GoodRx.  - Si necesita que su receta se enve electrnicamente a Chiropodist, informe a nuestra oficina a travs de MyChart de  o por telfono llamando al [phone] y  presione la opcin 4.

## 2021-11-16 ENCOUNTER — Encounter: Payer: Self-pay | Admitting: Dermatology

## 2021-11-21 ENCOUNTER — Telehealth: Payer: Self-pay

## 2021-11-21 NOTE — Telephone Encounter (Signed)
Advised patient of Bx results. [place] for recheck.

## 2021-11-21 NOTE — Telephone Encounter (Signed)
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 Thank you!

## 2021-12-01 ENCOUNTER — Ambulatory Visit: Payer: Medicare Other

## 2021-12-01 ENCOUNTER — Encounter: Payer: Medicare Other | Admitting: Acute Care

## 2021-12-05 NOTE — Progress Notes (Signed)
Virtual Visit via Telephone Note  I connected with Katelyn Ball on 12/05/21 at  3:30 PM EST by telephone and verified that I am speaking with the correct person using two identifiers.  Location: Patient:  At home Provider:  [place], [place], [place], [address]    I discussed the limitations, risks, security and privacy concerns of performing an evaluation and management service by telephone and the availability of in person appointments. I also discussed with the patient that there may be a patient responsible charge related to this service. The patient expressed understanding and agreed to proceed.   Shared Decision Making Visit Lung Cancer Screening Program [phone])   Eligibility: Age 76 y.o. Pack Years Smoking History Calculation 20 pack year smoking hisotry (# packs/per year x # years smoked) Recent History of coughing up blood  no Unexplained weight loss? no ( >Than 15 pounds within the last 6 months ) Prior History Lung / other cancer no (Diagnosis within the last 5 years already requiring surveillance chest CT Scans). Smoking Status Former Smoker Former Smokers: Years since quit: 14 years  Quit Date: 2009  Visit Components: Discussion included one or more decision making aids. yes Discussion included risk/benefits of screening. yes Discussion included potential follow up diagnostic testing for abnormal scans. yes Discussion included meaning and risk of over diagnosis. yes Discussion included meaning and risk of False Positives. yes Discussion included meaning of total radiation exposure. yes  Counseling Included: Importance of adherence to annual lung cancer LDCT screening. yes Impact of comorbidities on ability to participate in the program. yes Ability and willingness to under diagnostic treatment. yes  Smoking Cessation Counseling: Current Smokers:  Discussed importance of smoking cessation. yes Information about tobacco cessation classes and  interventions provided to patient. yes Patient provided with "ticket" for LDCT Scan. yes Symptomatic Patient. no  Counseling NA Diagnosis Code: Tobacco Use Z72.0 Asymptomatic Patient yes  Counseling (Intermediate counseling: > three minutes counseling) [id number] Former Smokers:  Discussed the importance of maintaining cigarette abstinence. yes Diagnosis Code: Personal History of Nicotine Dependence. Y63.785 Information about tobacco cessation classes and interventions provided to patient. Yes Patient provided with "ticket" for LDCT Scan. yes Written Order for Lung Cancer Screening with LDCT placed in Epic. Yes (CT Chest Lung Cancer Screening Low Dose W/O CM) [id number] Z12.2-Screening of respiratory organs Z87.891-Personal history of nicotine dependence  I spent 25 minutes of face to face time/virtual visit time  with  Katelyn Ball discussing the risks and benefits of lung cancer screening. We took the time to pause the power point at intervals to allow for questions to be asked and answered to ensure understanding. We discussed that she had taken the single most powerful action possible to decrease her risk of developing lung cancer when she quit smoking. I counseled her to remain smoke free, and to contact me if she ever had the desire to smoke again so that I can provide resources and tools to help support the effort to remain smoke free. We discussed the time and location of the scan, and that either  Katelyn Glassman RN, Katelyn Prince, RN or I  or I will call / send a letter with the results within  24-72 hours of receiving them. She has the office contact information in the event she needs to speak with me,  she verbalized understanding of all of the above and had no further questions upon leaving the office.     I explained to the patient that there  has been a high incidence of coronary artery disease noted on these exams. I explained that this is a non-gated exam therefore degree or severity cannot  be determined. This patient is on statin therapy. I have asked the patient to follow-up with their PCP regarding any incidental finding of coronary artery disease and management with diet or medication as they feel is clinically indicated. The patient verbalized understanding of the above and had no further questions.     [name], NP 12/06/2021

## 2021-12-06 ENCOUNTER — Encounter: Payer: Self-pay | Admitting: Acute Care

## 2021-12-06 ENCOUNTER — Encounter: Payer: Medicare Other | Admitting: Dermatology

## 2021-12-06 ENCOUNTER — Ambulatory Visit (INDEPENDENT_AMBULATORY_CARE_PROVIDER_SITE_OTHER): Payer: Medicare Other | Admitting: Acute Care

## 2021-12-06 ENCOUNTER — Encounter: Payer: Medicare Other | Admitting: Acute Care

## 2021-12-06 ENCOUNTER — Other Ambulatory Visit: Payer: Self-pay

## 2021-12-06 DIAGNOSIS — Z87891 Personal history of nicotine dependence: Secondary | ICD-10-CM | POA: Diagnosis not present

## 2021-12-06 NOTE — Patient Instructions (Signed)
Thank you for participating in the [facility] Lung Cancer Screening Program. °It was our pleasure to meet you today. °We will call you with the results of your scan within the next few days. °Your scan will be assigned a Lung RADS category score by the physicians reading the scans.  °This Lung RADS score determines follow up scanning.  °See below for description of categories, and follow up screening recommendations. °We will be in touch to schedule your follow up screening annually or based on recommendations of our providers. °We will fax a copy of your scan results to your Primary Care Physician, or the physician who referred you to the program, to ensure they have the results. °Please call the office if you have any questions or concerns regarding your scanning experience or results.  °Our office number is [phone]. °Please speak with Denise Phelps, RN. She is our Lung Cancer Screening RN. °If she is unavailable when you call, please have the office staff send her a message. She will return your call at her earliest convenience. °Remember, if your scan is normal, we will scan you annually as long as you continue to meet the criteria for the program. (Age 55-77, Current smoker or smoker who has quit within the last 15 years). °If you are a smoker, remember, quitting is the single most powerful action that you can take to decrease your risk of lung cancer and other pulmonary, breathing related problems. °We know quitting is hard, and we are here to help.  °Please let us know if there is anything we can do to help you meet your goal of quitting. °If you are a former smoker, congratulations. We are proud of you! Remain smoke free! °Remember you can refer friends or family members through the number above.  °We will screen them to make sure they meet criteria for the program. °Thank you for helping us take better care of you by participating in Lung Screening. ° °You can receive free nicotine replacement therapy  ( patches, gum or mints) by calling 1-800-QUIT NOW. Please call so we can get you on the path to becoming  a non-smoker. I know it is hard, but you can do this! ° °Lung RADS Categories: ° °Lung RADS 1: no nodules or definitely non-concerning nodules.  °Recommendation is for a repeat annual scan in 12 months. ° °Lung RADS 2:  nodules that are non-concerning in appearance and behavior with a very low likelihood of becoming an active cancer. °Recommendation is for a repeat annual scan in 12 months. ° °Lung RADS 3: nodules that are probably non-concerning , includes nodules with a low likelihood of becoming an active cancer.  Recommendation is for a 6-month repeat screening scan. Often noted after an upper respiratory illness. We will be in touch to make sure you have no questions, and to schedule your 6-month scan. ° °Lung RADS 4 A: nodules with concerning findings, recommendation is most often for a follow up scan in 3 months or additional testing based on our provider's assessment of the scan. We will be in touch to make sure you have no questions and to schedule the recommended 3 month follow up scan. ° °Lung RADS 4 B:  indicates findings that are concerning. We will be in touch with you to schedule additional diagnostic testing based on our provider's  assessment of the scan. ° °Hypnosis for smoking cessation  °[employer] °[phone] ° °Acupuncture for smoking cessation  °[facility] °[phone]  °

## 2021-12-07 ENCOUNTER — Ambulatory Visit
Admission: RE | Admit: 2021-12-07 | Discharge: 2021-12-07 | Disposition: A | Discharge status: Home / Self Care | Payer: Medicare Other | Source: Ambulatory Visit | Attending: Acute Care | Admitting: Acute Care

## 2021-12-07 ENCOUNTER — Other Ambulatory Visit: Payer: Self-pay

## 2021-12-07 DIAGNOSIS — Z87891 Personal history of nicotine dependence: Secondary | ICD-10-CM

## 2021-12-29 ENCOUNTER — Other Ambulatory Visit: Payer: Self-pay | Admitting: Acute Care

## 2021-12-29 DIAGNOSIS — Z87891 Personal history of nicotine dependence: Secondary | ICD-10-CM

## 2022-01-12 ENCOUNTER — Other Ambulatory Visit: Payer: Self-pay | Admitting: Internal Medicine

## 2022-01-12 DIAGNOSIS — Z1231 Encounter for screening mammogram for malignant neoplasm of breast: Secondary | ICD-10-CM

## 2022-02-01 ENCOUNTER — Telehealth: Payer: Self-pay

## 2022-02-01 NOTE — Telephone Encounter (Signed)
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 Patient deferred treatment with EDC and did treat with 5FU/calcipotriene but we have no documentation that it was clear. Patient was scheduled for TBSE 4/19 with Dr. Nicole Kindred and note was made to check SCCis site.  ?Lurlean Horns., RMA ?

## 2022-02-19 ENCOUNTER — Ambulatory Visit
Admission: RE | Admit: 2022-02-19 | Discharge: 2022-02-19 | Disposition: A | Discharge status: Home / Self Care | Payer: Medicare Other | Source: Ambulatory Visit | Attending: Internal Medicine | Admitting: Internal Medicine

## 2022-02-19 DIAGNOSIS — Z1231 Encounter for screening mammogram for malignant neoplasm of breast: Secondary | ICD-10-CM | POA: Insufficient documentation

## 2022-02-22 ENCOUNTER — Other Ambulatory Visit: Payer: Self-pay | Admitting: Internal Medicine

## 2022-02-22 DIAGNOSIS — R928 Other abnormal and inconclusive findings on diagnostic imaging of breast: Secondary | ICD-10-CM

## 2022-03-07 ENCOUNTER — Ambulatory Visit: Payer: Medicare Other | Admitting: Dermatology

## 2022-03-07 DIAGNOSIS — L570 Actinic keratosis: Secondary | ICD-10-CM | POA: Diagnosis not present

## 2022-03-07 DIAGNOSIS — L814 Other melanin hyperpigmentation: Secondary | ICD-10-CM

## 2022-03-07 DIAGNOSIS — D229 Melanocytic nevi, unspecified: Secondary | ICD-10-CM

## 2022-03-07 DIAGNOSIS — Z85828 Personal history of other malignant neoplasm of skin: Secondary | ICD-10-CM | POA: Diagnosis not present

## 2022-03-07 DIAGNOSIS — L578 Other skin changes due to chronic exposure to nonionizing radiation: Secondary | ICD-10-CM

## 2022-03-07 DIAGNOSIS — Z1283 Encounter for screening for malignant neoplasm of skin: Secondary | ICD-10-CM | POA: Diagnosis not present

## 2022-03-07 DIAGNOSIS — D1800 Hemangioma unspecified site: Secondary | ICD-10-CM

## 2022-03-07 DIAGNOSIS — Z86007 Personal history of in-situ neoplasm of skin: Secondary | ICD-10-CM

## 2022-03-07 DIAGNOSIS — L821 Other seborrheic keratosis: Secondary | ICD-10-CM

## 2022-03-07 NOTE — Patient Instructions (Addendum)
Actinic keratoses are precancerous spots that appear secondary to cumulative UV radiation exposure/sun exposure over time. They are chronic with expected duration over 1 year. A portion of actinic keratoses will progress to squamous cell carcinoma of the skin. It is not possible to reliably predict which spots will progress to skin cancer and so treatment is recommended to prevent development of skin cancer. ? ?Recommend daily broad spectrum sunscreen SPF 30+ to sun-exposed areas, reapply every 2 hours as needed.  ?Recommend staying in the shade or wearing long sleeves, sun glasses (UVA+UVB protection) and wide brim hats (4-inch brim around the entire circumference of the hat). ?Call for new or changing lesions.  ? ?Cryotherapy Aftercare ? ?Wash gently with soap and water everyday.   ?Apply Vaseline and Band-Aid daily until healed.  ? ?Seborrheic Keratosis ? ?What causes seborrheic keratoses? ?Seborrheic keratoses are harmless, common skin growths that first appear during adult life.  As time goes by, more growths appear.  Some people may develop a large number of them.  Seborrheic keratoses appear on both covered and uncovered body parts.  They are not caused by sunlight.  The tendency to develop seborrheic keratoses can be inherited.  They vary in color from skin-colored to gray, brown, or even black.  They can be either smooth or have a rough, warty surface.   ?Seborrheic keratoses are superficial and look as if they were stuck on the skin.  Under the microscope this type of keratosis looks like layers upon layers of skin.  That is why at times the top layer may seem to fall off, but the rest of the growth remains and re-grows.   ? ?Treatment ?Seborrheic keratoses do not need to be treated, but can easily be removed in the office.  Seborrheic keratoses often cause symptoms when they rub on clothing or jewelry.  Lesions can be in the way of shaving.  If they become inflamed, they can cause itching, soreness, or  burning.  Removal of a seborrheic keratosis can be accomplished by freezing, burning, or surgery. ?If any spot bleeds, scabs, or grows rapidly, please return to have it checked, as these can be an indication of a skin cancer. ? ? ? ? ?Melanoma ABCDEs ? ?Melanoma is the most dangerous type of skin cancer, and is the leading cause of death from skin disease.  You are more likely to develop melanoma if you: ?Have light-colored skin, light-colored eyes, or red or blond hair ?Spend a lot of time in the sun ?Tan regularly, either outdoors or in a tanning bed ?Have had blistering sunburns, especially during childhood ?Have a close family member who has had a melanoma ?Have atypical moles or large birthmarks ? ?Early detection of melanoma is key since treatment is typically straightforward and cure rates are extremely high if we catch it early.  ? ?The first sign of melanoma is often a change in a mole or a new dark spot.  The ABCDE system is a way of remembering the signs of melanoma. ? ?A for asymmetry:  The two halves do not match. ?B for border:  The edges of the growth are irregular. ?C for color:  A mixture of colors are present instead of an even brown color. ?D for diameter:  Melanomas are usually (but not always) greater than 33m - the size of a pencil eraser. ?E for evolution:  The spot keeps changing in size, shape, and color. ? ?Please check your skin once per month between visits. You can use  a small mirror in front and a large mirror behind you to keep an eye on the back side or your body.  ? ?If you see any new or changing lesions before your next follow-up, please call to schedule a visit. ? ?Please continue daily skin protection including broad spectrum sunscreen SPF 30+ to sun-exposed areas, reapplying every 2 hours as needed when you're outdoors.  ? ?Staying in the shade or wearing long sleeves, sun glasses (UVA+UVB protection) and wide brim hats (4-inch brim around the entire circumference of the hat)  are also recommended for sun protection.   ? ? ?If You Need Anything After Your Visit ? ?If you have any questions or concerns for your doctor, please call our main line at [phone] and press option 4 to reach your doctor's medical assistant. If no one answers, please leave a voicemail as directed and we will return your call as soon as possible. Messages left after 4 pm will be answered the following business day.  ? ?You may also send [place] a message via MyChart. We typically respond to MyChart messages within 1-2 business days. ? ?For prescription refills, please ask your pharmacy to contact our office. Our fax number is [phone]. ? ?If you have an urgent issue when the clinic is closed that cannot wait until the next business day, you can page your doctor at the number below.   ? ?Please note that while we do our best to be available for urgent issues outside of office hours, we are not available 24/7.  ? ?If you have an urgent issue and are unable to reach [place], you may choose to seek medical care at your doctor's office, retail clinic, urgent care center, or emergency room. ? ?If you have a medical emergency, please immediately call 911 or go to the emergency department. ? ?Pager Numbers ? ?- Dr. Nehemiah Massed: [phone] ? ?- Dr. Laurence Ferrari: [phone] ? ?- Dr. Nicole Kindred: [phone] ? ?In the event of inclement weather, please call our main line at [phone] for an update on the status of any delays or closures. ? ?Dermatology Medication Tips: ?Please keep the boxes that topical medications come in in order to help keep track of the instructions about where and how to use these. Pharmacies typically print the medication instructions only on the boxes and not directly on the medication tubes.  ? ?If your medication is too expensive, please contact our office at [phone] option 4 or send [place] a message through [place].  ? ?We are unable to tell what your co-pay for medications will be in advance as this is  different depending on your insurance coverage. However, we may be able to find a substitute medication at lower cost or fill out paperwork to get insurance to cover a needed medication.  ? ?If a prior authorization is required to get your medication covered by your insurance company, please allow [place] 1-2 business days to complete this process. ? ?Drug prices often vary depending on where the prescription is filled and some pharmacies may offer cheaper prices. ? ?The website www.goodrx.com contains coupons for medications through different pharmacies. The prices here do not account for what the cost may be with help from insurance (it may be cheaper with your insurance), but the website can give you the price if you did not use any insurance.  ?- You can print the associated coupon and take it with your prescription to the pharmacy.  ?- You may also stop by our office during regular  business hours and pick up a GoodRx coupon card.  ?- If you need your prescription sent electronically to a different pharmacy, notify our office through [facility] or by phone at [phone] option 4. ? ? ? ? ?Si Usted Necesita Algo Despu?s de Su Visita ? ?Tambi?n puede enviarnos un mensaje a trav?s de MyChart. Por lo general respondemos a los mensajes de MyChart en el transcurso de 1 a 2 d?as h?biles. ? ?Para renovar recetas, por favor pida a su farmacia que se ponga en contacto con nuestra oficina. Nuestro n?mero de fax es el [phone]. ? ?Si tiene un asunto urgente cuando la cl?nica est? cerrada y que no puede esperar hasta el siguiente d?a h?bil, puede llamar/localizar a su doctor(a) al n?mero que aparece a continuaci?n.  ? ?Por favor, tenga en cuenta que aunque hacemos todo lo posible para estar disponibles para asuntos urgentes fuera del horario de oficina, no estamos disponibles las 24 horas del d?a, los 7 d?as de la semana.  ? ?Si tiene un problema urgente y no puede comunicarse con nosotros, puede optar por buscar  atenci?n m?dica  en el consultorio de su doctor(a), en una cl?nica privada, en un centro de atenci?n urgente o en una sala de emergencias. ? ?Si tiene una emergencia m?dica, por favor llame inmediatamente al

## 2022-03-07 NOTE — Progress Notes (Signed)
? ?Follow-Up Visit ?  ?Subjective  ?Katelyn Ball is a 76 y.o. 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 Patient reports some spots at face and right forehead). ? ?The patient presents for Total-Body Skin Exam (TBSE) for skin cancer screening and mole check.  The patient has spots, moles and lesions to be evaluated, some may be new or changing and the patient has concerns that these could be cancer. ? ? ?The following portions of the chart were reviewed this encounter and updated as appropriate:   ?  ? ?Review of Systems: No other skin or systemic complaints except as noted in HPI or Assessment and Plan. ? ? ?Objective  ?Well appearing patient in no apparent distress; mood and affect are within normal limits. ? ?A full examination was performed including scalp, head, eyes, ears, nose, lips, neck, chest, axillae, abdomen, back, buttocks, bilateral upper extremities, bilateral lower extremities, hands, feet, fingers, toes, fingernails, and toenails. All findings within normal limits unless otherwise noted below. ? ?right lateral eyebrow x 1, right and left medial cheek x 2, sternum x 4 (7) ?Erythematous thin papules/macules with gritty scale.  ? ? ?Assessment & Plan  ?Actinic keratosis (7) ?right lateral eyebrow x 1, right and left medial cheek x 2, sternum x 4 ? ?Actinic keratoses are precancerous spots that appear secondary to cumulative UV radiation exposure/sun exposure over time. They are chronic with expected duration over 1 year. A portion of actinic keratoses will progress to squamous cell carcinoma of the skin. It is not possible to reliably predict which spots will progress to skin cancer and so treatment is recommended to prevent development of skin cancer. ? ?Recommend daily broad spectrum sunscreen SPF 30+ to sun-exposed areas, reapply every 2 hours as needed.   ?Recommend staying in the shade or wearing long sleeves, sun glasses (UVA+UVB protection) and wide brim hats (4-inch brim around the entire circumference of the hat). ?Call for new or changing lesions. ? ?Destruction of lesion - right lateral eyebrow x 1, right and left medial cheek x 2, sternum x 4 ?Complexity: simple   ?Destruction method: cryotherapy   ?Informed consent: discussed and consent obtained   ?Timeout:  patient name, date of birth, surgical site, and procedure verified ?Lesion destroyed using liquid nitrogen: Yes   ?Region frozen until ice ball extended beyond lesion: Yes   ?Outcome: patient tolerated procedure well with no complications   ?Post-procedure details: wound care instructions given   ?Additional details:  Prior to procedure, discussed risks of blister formation, small wound, skin dyspigmentation, or rare scar following cryotherapy. Recommend Vaseline ointment to treated areas while healing. ? ? ?Lentigines ?- Scattered tan macules ?- Due to sun exposure ?- Benign-appearing, observe ?- Recommend daily broad spectrum sunscreen SPF 30+ to sun-exposed areas, reapply every 2 hours as needed. ?- Call for any changes ? ?Seborrheic Keratoses ?- Stuck-on, waxy, tan-brown papules and/or plaques at arms  ?- Benign-appearing ?- Discussed benign etiology and prognosis. ?- Observe ?- Call for any changes ? ?Melanocytic Nevi ?- Tan-brown and/or pink-flesh-colored symmetric macules and papules right antecubital  ?- Benign appearing on exam today ?- Observation ?- Call clinic for new or changing moles ?- Recommend daily use of broad spectrum spf 30+ sunscreen to sun-exposed areas.  ? ?Hemangiomas ?- Red papules ?- Discussed benign nature ?- Observe ?-  Call for any changes ? ?Actinic Damage ?- Chronic condition, secondary to cumulative UV/sun exposure ?- diffuse scaly erythematous macules with underlying dyspigmentation ?- Recommend daily broad spectrum sunscreen SPF 30+ to sun-exposed areas, reapply every  2 hours as needed.  ?- Staying in the shade or wearing long sleeves, sun glasses (UVA+UVB protection) and wide brim hats (4-inch brim around the entire circumference of the hat) are also recommended for sun protection.  ?- Call for new or changing lesions. ? ?History of Squamous Cell Carcinoma in Situ of the Skin ?- No evidence of recurrence today at left post calf infrerior to popliteal fossa 05/2021 ?Left upper cutaneous lip 2020 ?- Recommend regular full body skin exams ?- Recommend daily broad spectrum sunscreen SPF 30+ to sun-exposed areas, reapply every 2 hours as needed.  ?- Call if any new or changing lesions are noted between office visits ? ?History of Squamous Cell Carcinoma of the Skin ?- No evidence of recurrence today right upper arm 2022 ?- Recommend regular full body skin exams ?- Recommend daily broad spectrum sunscreen SPF 30+ to sun-exposed areas, reapply every 2 hours as needed.  ?- Call if any new or changing lesions are noted between office visits ? ?Skin cancer screening performed today. ?Return in about 6 months (around 09/06/2022) for  ak followup. ?I, Ruthell Rummage, CMA, am acting as scribe for Brendolyn Patty, MD. ? ?Documentation: I have reviewed the above documentation for accuracy and completeness, and I agree with the above. ? ?Brendolyn Patty MD  ? ?

## 2022-03-12 ENCOUNTER — Ambulatory Visit
Admission: RE | Admit: 2022-03-12 | Discharge: 2022-03-12 | Disposition: A | Discharge status: Home / Self Care | Payer: Medicare Other | Source: Ambulatory Visit | Attending: Internal Medicine | Admitting: Internal Medicine

## 2022-03-12 DIAGNOSIS — R928 Other abnormal and inconclusive findings on diagnostic imaging of breast: Secondary | ICD-10-CM | POA: Diagnosis not present

## 2022-03-14 ENCOUNTER — Other Ambulatory Visit: Payer: Self-pay | Admitting: Internal Medicine

## 2022-03-14 DIAGNOSIS — R928 Other abnormal and inconclusive findings on diagnostic imaging of breast: Secondary | ICD-10-CM

## 2022-03-14 DIAGNOSIS — N63 Unspecified lump in unspecified breast: Secondary | ICD-10-CM

## 2022-03-26 ENCOUNTER — Ambulatory Visit
Admission: RE | Admit: 2022-03-26 | Discharge: 2022-03-26 | Disposition: A | Discharge status: Home / Self Care | Payer: Medicare Other | Source: Ambulatory Visit | Attending: Internal Medicine | Admitting: Internal Medicine

## 2022-03-26 DIAGNOSIS — N63 Unspecified lump in unspecified breast: Secondary | ICD-10-CM | POA: Diagnosis present

## 2022-03-26 DIAGNOSIS — R928 Other abnormal and inconclusive findings on diagnostic imaging of breast: Secondary | ICD-10-CM | POA: Insufficient documentation

## 2022-03-26 HISTORY — PX: BREAST BIOPSY: SHX20

## 2022-03-27 DIAGNOSIS — R7303 Prediabetes: Secondary | ICD-10-CM | POA: Insufficient documentation

## 2022-03-27 LAB — SURGICAL PATHOLOGY

## 2022-03-28 ENCOUNTER — Telehealth: Payer: Self-pay | Admitting: Hematology and Oncology

## 2022-03-28 NOTE — Telephone Encounter (Signed)
Spoke with patient to confirm afternoon clinic appointment for 5/17, packet will be mailed to patient  ?

## 2022-04-02 ENCOUNTER — Encounter: Payer: Self-pay | Admitting: *Deleted

## 2022-04-03 NOTE — Progress Notes (Signed)
Radiation Oncology         [phone] ________________________________  Initial Outpatient Consultation  Name: Katelyn Ball MRN: [id number]  Date: 04/04/2022  DOB: May 12, 1946  BZ:JIRCV, Tonette Bihari, MD  Jovita Kussmaul, MD   REFERRING PHYSICIAN: Autumn Messing III, MD  DIAGNOSIS:    ICD-10-CM   1. Malignant neoplasm of upper-outer quadrant of right breast in female, estrogen receptor positive ([place])  C50.411    Z17.0       Right Breast UOQ Invasive Mammary/Lobular Carcinoma, ER+ / PR+ / Her2-, Grade 1   Cancer Staging  Malignant neoplasm of upper-outer quadrant of right breast in female, estrogen receptor positive ([place]) Staging form: Breast, AJCC 8th Edition - Clinical: Stage IA (cT1c, cN0, cM0, G1, ER+, PR+, HER2-) - Signed by Nicholas Lose, MD on 04/04/2022   CHIEF COMPLAINT: Here to discuss management of right breast cancer  HISTORY OF PRESENT ILLNESS::Katelyn Ball is a 76 y.o. female who presented with a right breast abnormality on the following imaging: bilateral screening mammogram on the date of 02/19/22.  No symptoms, if any, were reported at that time. However, after her screening mammogram the patient reported feeling a palpable lump in the upper right breast. Diagnostic right breast mammogram and right breast ultrasound on 03/12/22 further revealed an irregular, hypoechoic right breast mass at the 12 o'clock position, 5 cmfn, measuring 1.1 x 0.6 x 1.0 cm with associated vascularity. No evidence of right axillary lymphadenopathy was appreciated.   Biopsy of the 12:00 right breast, 5cmfn, on date of 03/26/22 showed grade 1 invasive mammary carcinoma with lobular features.  ER status: >90% positive; PR status >90% positive (both with strong staining intensity), Her2 status negative; Grade 1. No lymph nodes were examined.  She is otherwise in her USOH. She is here with her husband. She is a retired [employer] scientist.  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    PAST SURGICAL HISTORY: Past Surgical History:  Procedure Laterality Date   ABDOMINAL HYSTERECTOMY     BREAST BIOPSY Right 03/26/2022   u/s bx rt 12:00 5cmfn venus clip path pending   BREAST CYST ASPIRATION Left    BREAST CYST ASPIRATION Right 09/25/2017   2 FNA's done on cysts   COLONOSCOPY WITH PROPOFOL N/A 10/05/2015   Procedure: COLONOSCOPY WITH PROPOFOL;  Surgeon: Manya Silvas, MD;  [facility] END[facility]COPY;  Service: Endoscopy;  Laterality: N/A;   ESOPHAGOGASTRODUODENOSCOPY (EGD) WITH PROPOFOL N/A 10/05/2015   Procedure: ESOPHAGOGASTRODUODENOSCOPY (EGD) WITH PROPOFOL;  Surgeon: Manya Silvas, MD;  Location: [facility] ENDOSCOPY;  Service: Endoscopy;  Laterality: N/A;   JOINT REPLACEMENT Bilateral 11/2012   LAMINECTOMY AND MICRODISCECTOMY LUMBAR SPINE     PILONIDAL CYST DRAINAGE     TONSILLECTOMY      FAMILY HISTORY: family history includes Lymphoma in her maternal uncle; Prostate cancer in her father; Rectal cancer (age of onset: 37) in her mother.  SOCIAL HISTORY:  reports that she quit smoking about 13 years ago. Her smoking use included cigarettes. She has a 20.00 pack-year smoking history. She has never used smokeless tobacco. She reports that she does not drink alcohol and does  not use drugs.  ALLERGIES: Ciprofloxacin, Cyclobenzaprine, Cymbalta [duloxetine hcl], Demerol [meperidine], Erythromycin, Fiorinal [butalbital-aspirin-caffeine], Fish oil, Motrin [ibuprofen], and Naproxen  MEDICATIONS:  Current Outpatient Medications   Medication Sig Dispense Refill   Calcium Carbonate-Vitamin D (CALTRATE 600+D) 600-400 MG-UNIT tablet Take 1 tablet by mouth 2 (two) times daily.      cetirizine (ZYRTEC) 10 MG tablet Take 10 mg by mouth daily.     chlorthalidone (HYGROTON) 25 MG tablet Take by mouth.     cholecalciferol (VITAMIN D) 400 UNITS TABS tablet Take 400 Units by mouth daily.     clonazePAM (KLONOPIN) 1 MG tablet Take 1 mg by mouth at bedtime.     Cyanocobalamin 5000 MCG SUBL Place under the tongue.     diclofenac (VOLTAREN) 75 MG EC tablet Take 75 mg by mouth 2 (two) times daily.     estradiol (CLIMARA - DOSED IN MG/24 HR) 0.1 mg/24hr patch Place 0.1 mg onto the skin once a week.     fenofibrate 160 MG tablet Take 160 mg by mouth daily.     hydrocortisone 2.5 % cream Apply topically 2 (two) times daily. For 1 week. 30 g 1   ipratropium (ATROVENT HFA) 17 MCG/ACT inhaler Inhale 2 puffs into the lungs every 6 (six) hours as needed for wheezing.     magnesium oxide (MAG-OX) 400 MG tablet Take by mouth.     metoprolol succinate (TOPROL-XL) 25 MG 24 hr tablet Take 25 mg by mouth daily.     metroNIDAZOLE (FLAGYL) 500 MG tablet Take 500 mg by mouth 4 (four) times daily.     mometasone (ASMANEX) 220 MCG/INH inhaler Inhale 1 puff into the lungs 2 (two) times daily.     montelukast (SINGULAIR) 10 MG tablet Take 10 mg by mouth at bedtime.     mupirocin ointment (BACTROBAN) 2 % Apply 1 application topically daily. Apply to any open areas of skin and cover with band aide until healed. 22 g 0   pantoprazole (PROTONIX) 40 MG tablet Take 40 mg by mouth daily.     PARoxetine (PAXIL) 10 MG tablet Take 10 mg by mouth daily.     sucralfate (CARAFATE) 1 G tablet Take 1 g by mouth 4 (four) times daily -  with meals and at bedtime.     sulfamethoxazole-trimethoprim (BACTRIM DS,SEPTRA DS) 800-160 MG tablet Take 1 tablet by mouth 2 (two) times daily.     thyroid (ARMOUR) 30 MG tablet TAKE 1 TABLET (30 MG TOTAL) BY MOUTH EVERY MORNING BEFORE  BREAKFAST  2 TABLETS BY MOUTH IN AM     venlafaxine XR (EFFEXOR-XR) 37.5 MG 24 hr capsule Take 1 capsule (37.5 mg total) by mouth daily with breakfast. 30 capsule 11   No current facility-administered medications for this encounter.    REVIEW OF SYSTEMS: As above in HPI.   PHYSICAL EXAM:  vitals were not taken for this visit.   General: Alert and oriented, in no acute distress HEENT: Head is normocephalic. Extraocular movements are intact.   Neck: Neck is supple, no palpable cervical or supraclavicular lymphadenopathy. Heart: Regular in rate and rhythm with no murmurs, rubs, or gallops. Chest: Clear to auscultation bilaterally, with no rhonchi, wheezes, or rales. Extremities: No cyanosis or edema. Lymphatics: see Neck Exam Skin: No concerning lesions. Musculoskeletal: symmetric strength and muscle tone throughout. Neurologic: No obvious focalities. Speech is fluent. Coordination is intact. Psychiatric: Judgment and insight are intact. Affect is appropriate. Breasts: Right breast - 1cm mass, subtle, 12:00 . No other  palpable masses appreciated in the breasts or axillae bilaterally .  ECOG = 0  0 - Asymptomatic (Fully active, able to carry on all predisease activities without restriction)  1 - Symptomatic but completely ambulatory (Restricted in physically strenuous activity but ambulatory and able to carry out work of a light or sedentary nature. For example, light housework, office work)  2 - Symptomatic, <50% in bed during the day (Ambulatory and capable of all self care but unable to carry out any work activities. Up and about more than 50% of waking hours)  3 - Symptomatic, >50% in bed, but not bedbound (Capable of only limited self-care, confined to bed or chair 50% or more of waking hours)  4 - Bedbound (Completely disabled. Cannot carry on any self-care. Totally confined to bed or chair)  5 - Death   Eustace Pen MM, Creech RH, Tormey DC, et al. [phone]). "Toxicity and response  criteria of the [facility]". [place] Oncol. 5 (6): 649-55   LABORATORY DATA:  Lab Results  Component Value Date   WBC 6.1 04/04/2022   HGB 12.0 04/04/2022   HCT 34.7 (L) 04/04/2022   MCV 90.1 04/04/2022   PLT 275 04/04/2022   CMP     Component Value Date/Time   NA 130 (L) 04/04/2022 1231   NA 145 12/17/2012 0449   K 4.3 04/04/2022 1231   K 3.8 12/17/2012 0449   CL 98 04/04/2022 1231   CL 112 (H) 12/17/2012 0449   CO2 28 04/04/2022 1231   CO2 24 12/17/2012 0449   GLUCOSE 140 (H) 04/04/2022 1231   GLUCOSE 93 12/17/2012 0449   BUN 20 04/04/2022 1231   BUN 13 12/17/2012 0449   CREATININE 1.05 (H) 04/04/2022 1231   CREATININE 0.98 12/17/2012 0449   CALCIUM 9.2 04/04/2022 1231   CALCIUM 7.9 (L) 12/17/2012 0449   PROT 6.2 (L) 04/04/2022 1231   ALBUMIN 4.0 04/04/2022 1231   AST 24 04/04/2022 1231   ALT 18 04/04/2022 1231   ALKPHOS 74 04/04/2022 1231   BILITOT 0.5 04/04/2022 1231   GFRNONAA 55 (L) 04/04/2022 1231   GFRNONAA >60 12/17/2012 0449   GFRAA >60 12/17/2012 0449         RADIOGRAPHY: US BREAST LTD UNI RIGHT INC AXILLA  Result Date: 03/12/2022 CLINICAL DATA:  76 year old female recalled from screening mammogram dated 02/19/2022 for possible right breast distortion. Since the patient's mammogram, she now feels a palpable lump in the upper right breast. EXAM: DIGITAL DIAGNOSTIC UNILATERAL RIGHT MAMMOGRAM WITH TOMOSYNTHESIS AND CAD; ULTRASOUND RIGHT BREAST LIMITED TECHNIQUE: Right digital diagnostic mammography and breast tomosynthesis was performed. The images were evaluated with computer-aided detection.; Targeted ultrasound examination of the right breast was performed COMPARISON:  Previous exam(s). ACR Breast Density Category d: The breast tissue is extremely dense, which lowers the sensitivity of mammography. FINDINGS: There is persistent, focal distortion in the superior central right breast at mid to posterior depth. This corresponds with  the patient's new site of palpable lump. Further evaluation with ultrasound was performed. Targeted ultrasound is performed, showing an irregular, hypoechoic mass at the 12 o'clock position 5 cm from the nipple. It measures 1.1 x 0.6 x 1.0 cm. There is associated vascularity. This correlates well with the mammographic finding. Evaluation of the right axilla demonstrates no suspicious lymphadenopathy. IMPRESSION: 1. Suspicious right breast mass at the 12 o'clock position. Recommend ultrasound-guided biopsy. 2. No suspicious right axillary lymphadenopathy. RECOMMENDATION: Ultrasound-guided biopsy of the right breast. Recommend close attention on post  clip films to ensure correlation with the mammographically identified distortion. I have discussed the findings and recommendations with the patient. If applicable, a reminder letter will be sent to the patient regarding the next appointment. BI-RADS CATEGORY  4: Suspicious. Electronically Signed   By: Kristopher Oppenheim M.D.   On: 03/12/2022 14:43  MM DIAG BREAST TOMO UNI RIGHT  Result Date: 03/12/2022 CLINICAL DATA:  76 year old female recalled from screening mammogram dated 02/19/2022 for possible right breast distortion. Since the patient's mammogram, she now feels a palpable lump in the upper right breast. EXAM: DIGITAL DIAGNOSTIC UNILATERAL RIGHT MAMMOGRAM WITH TOMOSYNTHESIS AND CAD; ULTRASOUND RIGHT BREAST LIMITED TECHNIQUE: Right digital diagnostic mammography and breast tomosynthesis was performed. The images were evaluated with computer-aided detection.; Targeted ultrasound examination of the right breast was performed COMPARISON:  Previous exam(s). ACR Breast Density Category d: The breast tissue is extremely dense, which lowers the sensitivity of mammography. FINDINGS: There is persistent, focal distortion in the superior central right breast at mid to posterior depth. This corresponds with the patient's new site of palpable lump. Further evaluation with  ultrasound was performed. Targeted ultrasound is performed, showing an irregular, hypoechoic mass at the 12 o'clock position 5 cm from the nipple. It measures 1.1 x 0.6 x 1.0 cm. There is associated vascularity. This correlates well with the mammographic finding. Evaluation of the right axilla demonstrates no suspicious lymphadenopathy. IMPRESSION: 1. Suspicious right breast mass at the 12 o'clock position. Recommend ultrasound-guided biopsy. 2. No suspicious right axillary lymphadenopathy. RECOMMENDATION: Ultrasound-guided biopsy of the right breast. Recommend close attention on post clip films to ensure correlation with the mammographically identified distortion. I have discussed the findings and recommendations with the patient. If applicable, a reminder letter will be sent to the patient regarding the next appointment. BI-RADS CATEGORY  4: Suspicious. Electronically Signed   By: Kristopher Oppenheim M.D.   On: 03/12/2022 14:43  MM CLIP PLACEMENT RIGHT  Result Date: 03/26/2022 CLINICAL DATA:  Status post ultrasound-guided core biopsy of a right breast mass. EXAM: 3D DIAGNOSTIC RIGHT MAMMOGRAM POST ULTRASOUND BIOPSY COMPARISON:  Previous exam(s). FINDINGS: 3D Mammographic images were obtained following ultrasound guided biopsy of a mass in the 12 o'clock region of the right breast. The biopsy marking clip is in expected location in the 12 o'clock region of the right breast. IMPRESSION: Appropriate positioning of the venous shaped biopsy marking clip at the site of biopsy in the 12 o'clock region of the right breast. Final Assessment: Post Procedure Mammograms for Marker Placement Electronically Signed   By: Lillia Mountain M.D.   On: 03/26/2022 09:34  [place] RT BREAST BX W LOC DEV 1ST LESION IMG BX SPEC US GUIDE  Addendum Date: 03/28/2022   ADDENDUM REPORT: 03/28/2022 10:08 ADDENDUM: Pathology revealed BREAST, RIGHT AT 12:00, 5 CM FROM THE NIPPLE; ULTRASOUND-GUIDED CORE NEEDLE BIOPSY:GRADE I INVASIVE MAMMARY CARCINOMA,  WITH LOBULAR FEATURES (venus shaped clip). This was found to be concordant by Dr. Lillia Mountain. Pathology results were discussed with the patient by telephone. The patient reported doing well after the biopsy with tenderness at the site. Post biopsy instructions and care were reviewed and questions were answered. The patient was encouraged to call [facility] for any additional concerns. Per patient request, the patient was referred to [facility] at [facility] on Apr 04, 2022. Pathology results reported by Stacie Acres RN on 03/28/2022. Electronically Signed   By: Georgiana Shore.D.  On: 03/28/2022 10:08   Result Date: 03/28/2022 CLINICAL DATA:  Suspicious right breast mass. EXAM: ULTRASOUND GUIDED RIGHT BREAST CORE NEEDLE BIOPSY COMPARISON:  None Available. PROCEDURE: I met with the patient and we discussed the procedure of ultrasound-guided biopsy, including benefits and alternatives. We discussed the high likelihood of a successful procedure. We discussed the risks of the procedure, including infection, bleeding, tissue injury, clip migration, and inadequate sampling. Informed written consent was given. The usual time-out protocol was performed immediately prior to the procedure. Lesion quadrant: 12 o'clock Using sterile technique and 1% Lidocaine as local anesthetic, under direct ultrasound visualization, a 14 gauge spring-loaded device was used to perform biopsy of a mass in the 12 o'clock region of the right breast using a lateral to medial approach. At the conclusion of the procedure venous shaped tissue marker clip was deployed into the biopsy cavity. Follow up 2 view mammogram was performed and dictated separately. IMPRESSION: Ultrasound guided biopsy of the right breast. No apparent complications. Electronically Signed: By: Lillia Mountain M.D. On: 03/26/2022 09:29     IMPRESSION/PLAN:  Right breast cancer, Stage I, ER+   She has been discussed at our multidisciplinary tumor board.  The consensus is that she would be a good candidate for breast conservation. I talked to her about the option of a mastectomy and informed her that her expected overall survival would be equivalent between mastectomy and breast conservation, based upon randomized controlled data.  We recommend lumpectomy her her.  For the patient's early stage favorable risk breast cancer, we had a thorough discussion about her options for adjuvant therapy. One option would be antiestrogen therapy as discussed with medical oncology. She would take a pill for approximately 5 years. The alternative option (but less standard) would be radiotherapy to the breast. The most aggressive option would be to pursue both modalities.  Of note, I discussed the data from the [employer] al trial in the [place]. She understands that tamoxifen compared to radiation plus tamoxifen demonstrated no survival benefit among the women in this study. The women were 5 years or older with stage I estrogen receptor positive breast cancer. Based on this study, I told the patient that her overall life expectancy should not be affected by adding radiotherapy to antiestrogen medication. She understands that the main benefit of  adding radiotherapy to anti estrogen therapy would be a very small but measurable local control benefit (risk of local recurrence to be lowered from ~9% --> ~2% over a decade).  We discussed the fact that radiotherapy only provides a local control benefit while anti-estrogen pills provide systemic coverage. That being said, the risk of systemic failure is relatively low with her type of breast cancer.  We discussed the risks benefits and side effects of radiotherapy. She understands that the side effects would likely include some skin irritation and fatigue during the weeks of radiation. There is a risk of late  effects which include but are not necessarily limited to cosmetic changes and rare lung toxicity. I would anticipate delivering approximately 1-4 weeks of radiotherapy.  We discussed the week of ultra hypofractionated radiation therapy option which is standard than longer regimens.  The Fast Forward trial (1 week) method has shown a slight increase in normal tissue side effects over the 3 week hypofractionation standard at 5 years of follow-up. This includes effects like induration of the breast and edema.  However, for elderly patients that are keen on the convenience of minimizing the number  of procedures/visits to the cancer center, this is a nevertheless a reasonable approach to consider and the vast majority of patients do very well.  She is leaning toward the 3 week option, standard hypofractionation.  We spoke about other general acute effects of breast radiation including skin irritation and fatigue as well as breast fibrosis long term, and much less common late effects including internal organ injury or irritation. We spoke about the latest technology that is used to minimize the risk of late effects for patients undergoing radiotherapy to the breast or chest wall. No guarantees of treatment were given. The patient is enthusiastic about proceeding with treatment.  I look forward to participating in the patient's care.  I will await her referral back to me for postoperative follow-up and eventual CT simulation/treatment planning.    On date of service, in total, I spent 45 minutes on this encounter.  Patient was seen in person. Note was signed the day after encounter. Minutes indicated refer only to date of service.     __________________________________________   Eppie Gibson, MD  This document serves as a record of services personally performed by Eppie Gibson, MD. It was created on her behalf by Roney Mans, a trained medical scribe. The creation of this record is based on the scribe's  personal observations and the provider's statements to them. This document has been checked and approved by the attending provider.

## 2022-04-04 ENCOUNTER — Encounter: Payer: Self-pay | Admitting: *Deleted

## 2022-04-04 ENCOUNTER — Ambulatory Visit
Admission: RE | Admit: 2022-04-04 | Discharge: 2022-04-04 | Disposition: A | Discharge status: Home / Self Care | Payer: Medicare Other | Source: Ambulatory Visit | Attending: Radiation Oncology | Admitting: Radiation Oncology

## 2022-04-04 ENCOUNTER — Other Ambulatory Visit: Payer: Self-pay | Admitting: *Deleted

## 2022-04-04 ENCOUNTER — Inpatient Hospital Stay: Payer: Medicare Other | Attending: Hematology and Oncology

## 2022-04-04 ENCOUNTER — Inpatient Hospital Stay (HOSPITAL_BASED_OUTPATIENT_CLINIC_OR_DEPARTMENT_OTHER): Payer: Medicare Other | Admitting: Hematology and Oncology

## 2022-04-04 ENCOUNTER — Ambulatory Visit: Payer: Medicare Other | Attending: General Surgery | Admitting: Physical Therapy

## 2022-04-04 ENCOUNTER — Other Ambulatory Visit: Payer: Self-pay

## 2022-04-04 ENCOUNTER — Encounter: Payer: Self-pay | Admitting: Physical Therapy

## 2022-04-04 ENCOUNTER — Encounter: Payer: Self-pay | Admitting: General Practice

## 2022-04-04 ENCOUNTER — Ambulatory Visit (HOSPITAL_BASED_OUTPATIENT_CLINIC_OR_DEPARTMENT_OTHER): Payer: Medicare Other | Admitting: Genetic Counselor

## 2022-04-04 DIAGNOSIS — Z170 Estrogen receptor positive status [ER+]: Secondary | ICD-10-CM | POA: Diagnosis present

## 2022-04-04 DIAGNOSIS — C50411 Malignant neoplasm of upper-outer quadrant of right female breast: Secondary | ICD-10-CM | POA: Insufficient documentation

## 2022-04-04 DIAGNOSIS — Z7951 Long term (current) use of inhaled steroids: Secondary | ICD-10-CM | POA: Diagnosis not present

## 2022-04-04 DIAGNOSIS — Z79899 Other long term (current) drug therapy: Secondary | ICD-10-CM | POA: Insufficient documentation

## 2022-04-04 DIAGNOSIS — Z800 Family history of malignant neoplasm of digestive organs: Secondary | ICD-10-CM | POA: Diagnosis not present

## 2022-04-04 DIAGNOSIS — Z87891 Personal history of nicotine dependence: Secondary | ICD-10-CM | POA: Insufficient documentation

## 2022-04-04 DIAGNOSIS — R293 Abnormal posture: Secondary | ICD-10-CM | POA: Diagnosis present

## 2022-04-04 DIAGNOSIS — Z8042 Family history of malignant neoplasm of prostate: Secondary | ICD-10-CM

## 2022-04-04 LAB — CBC WITH DIFFERENTIAL (CANCER CENTER ONLY)
Abs Immature Granulocytes: 0.02 10*3/uL (ref 0.00–0.07)
Basophils Absolute: 0 10*3/uL (ref 0.0–0.1)
Basophils Relative: 1 %
Eosinophils Absolute: 0 10*3/uL (ref 0.0–0.5)
Eosinophils Relative: 0 %
HCT: 34.7 % — ABNORMAL LOW (ref 36.0–46.0)
Hemoglobin: 12 g/dL (ref 12.0–15.0)
Immature Granulocytes: 0 %
Lymphocytes Relative: 24 %
Lymphs Abs: 1.4 10*3/uL (ref 0.7–4.0)
MCH: 31.2 pg (ref 26.0–34.0)
MCHC: 34.6 g/dL (ref 30.0–36.0)
MCV: 90.1 fL (ref 80.0–100.0)
Monocytes Absolute: 0.5 10*3/uL (ref 0.1–1.0)
Monocytes Relative: 8 %
Neutro Abs: 4.1 10*3/uL (ref 1.7–7.7)
Neutrophils Relative %: 67 %
Platelet Count: 275 10*3/uL (ref 150–400)
RBC: 3.85 MIL/uL — ABNORMAL LOW (ref 3.87–5.11)
RDW: 13.5 % (ref 11.5–15.5)
WBC Count: 6.1 10*3/uL (ref 4.0–10.5)
nRBC: 0 % (ref 0.0–0.2)

## 2022-04-04 LAB — CMP (CANCER CENTER ONLY)
ALT: 18 U/L (ref 0–44)
AST: 24 U/L (ref 15–41)
Albumin: 4 g/dL (ref 3.5–5.0)
Alkaline Phosphatase: 74 U/L (ref 38–126)
Anion gap: 4 — ABNORMAL LOW (ref 5–15)
BUN: 20 mg/dL (ref 8–23)
CO2: 28 mmol/L (ref 22–32)
Calcium: 9.2 mg/dL (ref 8.9–10.3)
Chloride: 98 mmol/L (ref 98–111)
Creatinine: 1.05 mg/dL — ABNORMAL HIGH (ref 0.44–1.00)
GFR, Estimated: 55 mL/min — ABNORMAL LOW (ref >60)
Glucose, Bld: 140 mg/dL — ABNORMAL HIGH (ref 70–99)
Potassium: 4.3 mmol/L (ref 3.5–5.1)
Sodium: 130 mmol/L — ABNORMAL LOW (ref 135–145)
Total Bilirubin: 0.5 mg/dL (ref 0.3–1.2)
Total Protein: 6.2 g/dL — ABNORMAL LOW (ref 6.5–8.1)

## 2022-04-04 MED ORDER — VENLAFAXINE HCL ER 37.5 MG PO CP24: 37.5000 mg | ORAL_CAPSULE | Freq: Every day | ORAL | 11 refills | Status: DC

## 2022-04-04 NOTE — Progress Notes (Signed)
[place] ?CONSULT NOTE ? ?Patient Care Team: ?Adin Hector, MD as PCP - General (Internal Medicine) ?Mauro Kaufmann, RN as Oncology Nurse Navigator ?Rockwell Germany, RN as Oncology Nurse Navigator ?Jovita Kussmaul, MD as Consulting Physician (General Surgery) ?Nicholas Lose, MD as Consulting Physician (Hematology and Oncology) ?Eppie Gibson, MD as Attending Physician (Radiation Oncology) ? ?CHIEF COMPLAINTS/PURPOSE OF CONSULTATION:  ?Newly diagnosed breast cancer ? ?HISTORY OF PRESENTING ILLNESS:  ?Katelyn Ball 76 y.o. female is here because of recent diagnosis of right breast cancer.  She had a screening mammogram that detected right breast distortion at 12 o'clock position measuring 1.1 cm.  Axilla was negative.  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  She was presented this morning to the multidisciplinary tumor board and she is here today to discuss her treatment plan. ?  ?I reviewed her records extensively and collaborated the history with the patient. ? ?SUMMARY OF ONCOLOGIC HISTORY: ?Oncology History  ?Malignant neoplasm of upper-outer quadrant of right breast in female, estrogen receptor positive ([place])  ?03/26/2022 Initial Diagnosis  ? Screening detected right breast distortion at 12:00: 1.1 cm, axilla negative, ultrasound biopsy: Grade 1 ILC, ER greater than 90%, PR greater than 90%, HER2 negative, Ki-67 not done ?  ?04/04/2022 Cancer Staging  ? Staging form: Breast, AJCC 8th Edition ?- Clinical: Stage IA (cT1c, cN0, cM0, G1, ER+, PR+, HER2-) - Signed by Nicholas Lose, MD on 04/04/2022 ?Stage prefix: Initial diagnosis ?Histologic grading system: 3 grade system ? ?  ? ? ? ?MEDICAL HISTORY:  ?Past Medical History:  ?Diagnosis Date  ? Actinic keratosis   ? Anemia   ? Atelectasis   ? B12 deficiency   ? Cancer [facility])   ? skin  ? DDD (degenerative disc disease), cervical   ? Dysrhythmia   ? GERD (gastroesophageal reflux disease)   ?  History of chicken pox   ? Hyperlipidemia   ? Hypertension   ? Iritis   ? Meniere disease   ? 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 right upper arm, ATYPICAL SQUAMOUS PROLIFERATION, 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, re-biopsied and treated with [place] 11/15/21  ? 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 left posterior calf inferior to popliteal fossa  ? 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 left upper cutaneous lip  ? Vitamin D deficiency   ? ? ?SURGICAL HISTORY: ?Past Surgical History:  ?Procedure Laterality Date  ? ABDOMINAL HYSTERECTOMY    ? BREAST BIOPSY Right 03/26/2022  ? u/s bx rt 12:00 5cmfn venus clip path pending  ? BREAST CYST ASPIRATION Left   ? BREAST CYST ASPIRATION Right 09/25/2017  ? 2 FNA's done on cysts  ? COLONOSCOPY WITH PROPOFOL N/A 10/05/2015  ? Procedure: COLONOSCOPY WITH PROPOFOL;  Surgeon: Manya Silvas, MD;  Location: [facility] ENDOSCOPY;  Service: Endoscopy;  Laterality: N/A;  ? ESOPHAGOGASTRODUODENOSCOPY (EGD) WITH PROPOFOL N/A 10/05/2015  ? Procedure: ESOPHAGOGASTRODUODENOSCOPY (EGD) WITH PROPOFOL;  Surgeon: Manya Silvas, MD;  Location: [facility] ENDOSCOPY;  Service: Endoscopy;  Laterality: N/A;  ? JOINT REPLACEMENT Bilateral 11/2012  ? LAMINECTOMY AND MICRODISCECTOMY LUMBAR SPINE    ? PILONIDAL CYST DRAINAGE    ? TONSILLECTOMY    ? ? ?SOCIAL HISTORY: ?Social History  ? ?Socioeconomic History  ? Marital status: Married  ?  Spouse name: Not on file  ? Number of children: Not on file  ? Years of education: Not on  file  ? Highest education level: Not on file  ?Occupational History  ? Not on file  ?Tobacco Use  ? Smoking status: Former  ?  Packs/day: 0.50  ?  Years: 40.00  ?  Pack years: 20.00  ?  Types: Cigarettes  ?  Quit date: 10/03/2008  ?  Years since quitting: 13.5  ? Smokeless tobacco: Never  ?Substance and Sexual Activity  ? Alcohol use: No  ? Drug use: No  ? Sexual activity: Not on file  ?Other Topics Concern  ? Not on file  ?Social  History Narrative  ? Not on file  ? ?Social Determinants of Health  ? ?Financial Resource Strain: Not on file  ?Food Insecurity: Not on file  ?Transportation Needs: Not on file  ?Physical Activity: Not on file  ?Stress: Not on file  ?Social Connections: Not on file  ?Intimate Partner Violence: Not on file  ? ? ?FAMILY HISTORY: ?Family History  ?Problem Relation Age of Onset  ? Rectal cancer Mother   ? Breast cancer Neg Hx   ? ? ?ALLERGIES:  is allergic to ciprofloxacin, cyclobenzaprine, cymbalta [duloxetine hcl], demerol [meperidine], erythromycin, fiorinal [butalbital-aspirin-caffeine], fish oil, motrin [ibuprofen], and naproxen. ? ?MEDICATIONS:  ?Current Outpatient Medications  ?Medication Sig Dispense Refill  ? Calcium Carbonate-Vitamin D (CALTRATE 600+D) 600-400 MG-UNIT tablet Take 1 tablet by mouth 2 (two) times daily.     ? cetirizine (ZYRTEC) 10 MG tablet Take 10 mg by mouth daily.    ? cholecalciferol (VITAMIN D) 400 UNITS TABS tablet Take 400 Units by mouth daily.    ? clonazePAM (KLONOPIN) 1 MG tablet Take 1 mg by mouth at bedtime.    ? Cyanocobalamin 5000 MCG SUBL Place under the tongue.    ? diclofenac (VOLTAREN) 75 MG EC tablet Take 75 mg by mouth 2 (two) times daily.    ? estradiol (CLIMARA - DOSED IN MG/24 HR) 0.1 mg/24hr patch Place 0.1 mg onto the skin once a week.    ? fenofibrate 160 MG tablet Take 160 mg by mouth daily.    ? hydrocortisone 2.5 % cream Apply topically 2 (two) times daily. For 1 week. 30 g 1  ? ipratropium (ATROVENT HFA) 17 MCG/ACT inhaler Inhale 2 puffs into the lungs every 6 (six) hours as needed for wheezing.    ? magnesium oxide (MAG-OX) 400 MG tablet Take by mouth.    ? metoprolol succinate (TOPROL-XL) 25 MG 24 hr tablet Take 25 mg by mouth daily.    ? metroNIDAZOLE (FLAGYL) 500 MG tablet Take 500 mg by mouth 4 (four) times daily.    ? mometasone (ASMANEX) 220 MCG/INH inhaler Inhale 1 puff into the lungs 2 (two) times daily.    ? montelukast (SINGULAIR) 10 MG tablet Take 10 mg  by mouth at bedtime.    ? mupirocin ointment (BACTROBAN) 2 % Apply 1 application topically daily. Apply to any open areas of skin and cover with band aide until healed. 22 g 0  ? pantoprazole (PROTONIX) 40 MG tablet Take 40 mg by mouth daily.    ? PARoxetine (PAXIL) 10 MG tablet Take 10 mg by mouth daily.    ? sucralfate (CARAFATE) 1 G tablet Take 1 g by mouth 4 (four) times daily -  with meals and at bedtime.    ? sulfamethoxazole-trimethoprim (BACTRIM DS,SEPTRA DS) 800-160 MG tablet Take 1 tablet by mouth 2 (two) times daily.    ? thyroid (ARMOUR) 30 MG tablet TAKE 1 TABLET (30 MG TOTAL) BY MOUTH  EVERY MORNING BEFORE BREAKFAST  2 TABLETS BY MOUTH IN AM    ? venlafaxine XR (EFFEXOR-XR) 37.5 MG 24 hr capsule Take 1 capsule (37.5 mg total) by mouth daily with breakfast. 30 capsule 11  ? chlorthalidone (HYGROTON) 25 MG tablet Take by mouth.    ? ?No current facility-administered medications for this visit.  ? ? ?REVIEW OF SYSTEMS:   ?Constitutional: Denies fevers, chills or abnormal night sweats ?  ?All other systems were reviewed with the patient and are negative. ? ?PHYSICAL EXAMINATION: ?ECOG PERFORMANCE STATUS: 1 - Symptomatic but completely ambulatory ? ?Vitals:  ? 04/04/22 1253  ?BP: (!) 179/75  ?Pulse: 96  ?Resp: 18  ?Temp: 97.6 ?F (36.4 ?C)  ?SpO2: 99%  ? ?Filed Weights  ? 04/04/22 1253  ?Weight: 170 lb 4.8 oz (77.2 kg)  ? ?  ? ?LABORATORY DATA:  ?I have reviewed the data as listed ?Lab Results  ?Component Value Date  ? WBC 6.1 04/04/2022  ? HGB 12.0 04/04/2022  ? HCT 34.7 (L) 04/04/2022  ? MCV 90.1 04/04/2022  ? PLT 275 04/04/2022  ? ?Lab Results  ?Component Value Date  ? NA 130 (L) 04/04/2022  ? K 4.3 04/04/2022  ? CL 98 04/04/2022  ? CO2 28 04/04/2022  ? ? ?RADIOGRAPHIC STUDIES: ?I have personally reviewed the radiological reports and agreed with the findings in the report. ? ?ASSESSMENT AND PLAN:  ?Malignant neoplasm of upper-outer quadrant of right breast in female, estrogen receptor positive  ([place]) ?03/26/2022:Screening detected right breast distortion at 12:00: 1.1 cm, axilla negative, ultrasound biopsy: Grade 1 ILC, ER greater than 90%, PR greater than 90%, HER2 negative, Ki-67 not done ? ?Pathology a

## 2022-04-04 NOTE — Assessment & Plan Note (Signed)
03/26/2022:Screening detected right breast distortion at 12:00: 1.1 cm, axilla negative, ultrasound biopsy: Grade 1 ILC, ER greater than 90%, PR greater than 90%, HER2 negative, Ki-67 not done ? ?Pathology and radiology counseling: Discussed with the patient, the details of pathology including the type of breast cancer,the clinical staging, the significance of ER, PR and HER-2/neu receptors and the implications for treatment. After reviewing the pathology in detail, we proceeded to discuss the different treatment options between surgery, radiation, chemotherapy, antiestrogen therapies. ? ?Treatment plan: ?1.  Breast MRI to evaluate extent of disease ?2. breast conserving surgery ?3. +/- RT ?4.  Adjuvant antiestrogen therapy with letrozole 2.5 mg daily x7 years ? ?Return to clinic after surgery to discuss adjuvant treatment plan. ?

## 2022-04-04 NOTE — Research (Signed)
[employer] 2021-05 - Specimen Collection Study to Evaluate Biomarkers in Subjects with Cancer  ? ?Patient Katelyn Ball was identified by Dr Lindi Adie as a potential candidate for the above listed study. This Clinical Research Nurse met with Katelyn Ball, [id number], on 04/04/22 in a manner and location that ensures patient privacy to discuss participation in the above listed research study. Patient is Accompanied by her husband .  ? ?A copy of the informed consent document with embedded HIPAA language was provided to the patient. Patient reads, speaks, and understands [place]. Patient was provided with the business card of this Nurse and encouraged to contact the research team with any questions. Approximately 15 minutes were spent with the patient reviewing the informed consent documents.  Patient was provided the option of taking informed consent documents home to review and was encouraged to review at their convenience with their support network, including other care providers. Patient took the consent documents home to review. ? ?Patient is interested in participating; will review consent documents over the weekend and return to clinic on Monday, 5/22, to consent and have labs drawn. ? ?Katelyn Penna, RN, BSN, CPN ?Clinical Research Nurse I ?[phone] ? ?04/04/2022 3:18 PM ? ? ?

## 2022-04-04 NOTE — Therapy (Signed)
?OUTPATIENT PHYSICAL THERAPY BREAST CANCER BASELINE EVALUATION ? ? ?Patient Name: Katelyn Ball ?MRN: [id number] ?DOB:07-09-1946, 76 y.o., female ?Today's Date: 04/04/2022 ? ? PT End of Session - 04/04/22 1627   ? ? Visit Number 1   ? Number of Visits 2   ? Date for PT Re-Evaluation 05/30/22   ? PT Start Time 9528   ? PT Stop Time 1434   Also saw pt from 1514-1530 for a total of 33 minutes  ? PT Time Calculation (min) 17 min   ? Activity Tolerance Patient tolerated treatment well   ? Behavior During Therapy [facility] for tasks assessed/performed   ? ?  ?  ? ?  ? ? ?Past Medical History:  ?Diagnosis Date  ? Actinic keratosis   ? Anemia   ? Atelectasis   ? B12 deficiency   ? Cancer [facility])   ? skin  ? DDD (degenerative disc disease), cervical   ? Dysrhythmia   ? GERD (gastroesophageal reflux disease)   ? History of chicken pox   ? Hyperlipidemia   ? Hypertension   ? Iritis   ? Meniere disease   ? 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 right upper arm, ATYPICAL SQUAMOUS PROLIFERATION, 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, re-biopsied and treated with [facility] 11/15/21  ? 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 left posterior calf inferior to popliteal fossa  ? 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 left upper cutaneous lip  ? Vitamin D deficiency   ? ?Past Surgical History:  ?Procedure Laterality Date  ? ABDOMINAL HYSTERECTOMY    ? BREAST BIOPSY Right 03/26/2022  ? u/s bx rt 12:00 5cmfn venus clip path pending  ? BREAST CYST ASPIRATION Left   ? BREAST CYST ASPIRATION Right 09/25/2017  ? 2 FNA's done on cysts  ? COLONOSCOPY WITH PROPOFOL N/A 10/05/2015  ? Procedure: COLONOSCOPY WITH PROPOFOL;  Surgeon: Manya Silvas, MD;  Location: [facility] ENDOSCOPY;  Service: Endoscopy;  Laterality: N/A;  ? ESOPHAGOGASTRODUODENOSCOPY (EGD) WITH PROPOFOL N/A 10/05/2015  ? Procedure: ESOPHAGOGASTRODUODENOSCOPY (EGD) WITH PROPOFOL;  Surgeon: Manya Silvas, MD;  Location: [facility] ENDOSCOPY;  Service:  Endoscopy;  Laterality: N/A;  ? JOINT REPLACEMENT Bilateral 11/2012  ? LAMINECTOMY AND MICRODISCECTOMY LUMBAR SPINE    ? PILONIDAL CYST DRAINAGE    ? TONSILLECTOMY    ? ?Patient Active Problem List  ? Diagnosis Date Noted  ? Malignant neoplasm of upper-outer quadrant of right breast in female, estrogen receptor positive ([place]) 04/04/2022  ? 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 ? ?PCP: Dr. Ramonita Lab ? ?REFERRING PROVIDER: Dr. Autumn Messing ? ?REFERRING DIAG: Right breast cancer ? ?THERAPY DIAG:  ?Malignant neoplasm of upper-outer quadrant of right breast in female, estrogen receptor positive ([place]) ? ?Abnormal posture ? ?ONSET DATE: 02/19/2022 ? ?SUBJECTIVE                                                                                                                                                                                          ? ?  SUBJECTIVE STATEMENT: ?Patient reports she is here today to be seen by her medical team for her newly diagnosed right breast cancer.  ? ?PERTINENT HISTORY:  ?Patient was diagnosed on 02/19/2022 with 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 It measures 1.1 cm and is located in the upper outer quadrant. It is ER/PR positive and HER2 negative with an unknown Ki67.  ? ?PATIENT GOALS   reduce lymphedema risk and learn post op HEP.  ? ?PAIN:  ?Are you having pain? No ? ? ?PRECAUTIONS: Active CA  ? ?HAND DOMINANCE: right ? ?WEIGHT BEARING RESTRICTIONS No ? ?FALLS:  ?Has patient fallen in last 6 months? No ? ?LIVING ENVIRONMENT: ?Patient lives with: her husband ?Lives in: House/apartment ?Has following equipment at home: None ? ?OCCUPATION: Retired ? ?LEISURE: She does not exercise ? ?PRIOR LEVEL OF FUNCTION: Independent ? ? ?OBJECTIVE ? ?COGNITION: ? Overall cognitive status: Within functional limits for tasks assessed   ? ?POSTURE:  ?Forward head and rounded shoulders posture ? ?UPPER EXTREMITY AROM/PROM: ? ?A/PROM RIGHT  04/04/2022 ?  ?Shoulder extension 58   ?Shoulder flexion 145  ?Shoulder abduction 155  ?Shoulder internal rotation 74  ?Shoulder external rotation 81  ?  (Blank rows = not tested) ? ?A/PROM LEFT  04/04/2022  ?Shoulder extension 63  ?Shoulder flexion 141  ?Shoulder abduction 164  ?Shoulder internal rotation 64  ?Shoulder external rotation 87  ?  (Blank rows = not tested) ? ? ?CERVICAL AROM: ?All within normal limits ? ? ?UPPER EXTREMITY STRENGTH: WFL ? ? ?LYMPHEDEMA ASSESSMENTS:  ? ?[place] RIGHT  04/04/2022  ?10 cm proximal to olecranon process 29.8  ?Olecranon process 26.5  ?10 cm proximal to ulnar styloid process 22.5  ?Just proximal to ulnar styloid process 15.9  ?Across hand at thumb web space 19.2  ?At base of 2nd digit 6.3  ?(Blank rows = not tested) ? ?[place] LEFT  04/04/2022  ?10 cm proximal to olecranon process 27.4  ?Olecranon process 24.7  ?10 cm proximal to ulnar styloid process 21.6  ?Just proximal to ulnar styloid process 15.5  ?Across hand at thumb web space 18.8  ?At base of 2nd digit 6.4  ?(Blank rows = not tested) ? ? ?L-DEX LYMPHEDEMA SCREENING: ? ?The patient was assessed using the L-Dex machine today to produce a lymphedema index baseline score. The patient will be reassessed on a regular basis (typically every 3 months) to obtain new L-Dex scores. If the score is > 6.5 points away from his/her baseline score indicating onset of subclinical lymphedema, it will be recommended to wear a compression garment for 4 weeks, 12 hours per day and then be reassessed. If the score continues to be > 6.5 points from baseline at reassessment, we will initiate lymphedema treatment. Assessing in this manner has a 95% rate of preventing clinically significant lymphedema. ? ? L-DEX FLOWSHEETS - 04/04/22 1600   ? ?  ? L-DEX LYMPHEDEMA SCREENING  ? Measurement Type Unilateral   ? L-DEX MEASUREMENT EXTREMITY Upper Extremity   ? POSITION  Standing   ? DOMINANT SIDE Right   ? At Risk Side Right   ? BASELINE SCORE (UNILATERAL) -0.3   ? ?  ?  ? ?   ? ? ? ?QUICK DASH SURVEY: ? Katina Dung - 04/04/22 0001   ? ? Open a tight or new jar No difficulty   ? Do heavy household chores (wash walls, wash floors) No difficulty   ? Carry a shopping bag or briefcase No difficulty   ?  Wash your back Mild difficulty   ? Use a knife to cut food No difficulty   ? Recreational activities in which you take some force or impact through your arm, shoulder, or hand (golf, hammering, tennis) No difficulty   ? During the past week, to what extent has your arm, shoulder or hand problem interfered with your normal social activities with family, friends, neighbors, or groups? Not at all   ? During the past week, to what extent has your arm, shoulder or hand problem limited your work or other regular daily activities Not at all   ? Arm, shoulder, or hand pain. Mild   ? Tingling (pins and needles) in your arm, shoulder, or hand None   ? Difficulty Sleeping No difficulty   ? DASH Score 4.55 %   ? ?  ?  ? ?  ? ? ? ?PATIENT EDUCATION:  ?Education details: Lymphedema risk reduction and post op shoulder/posture HEP ?Person educated: Patient ?Education method: Explanation, Demonstration, Handout ?Education comprehension: Patient verbalized understanding and returned demonstration ? ? ?HOME EXERCISE PROGRAM: ?Patient was instructed today in a home exercise program today for post op shoulder range of motion. These included active assist shoulder flexion in sitting, scapular retraction, wall walking with shoulder abduction, and hands behind head external rotation.  She was encouraged to do these twice a day, holding 3 seconds and repeating 5 times when permitted by her physician. ? ? ?ASSESSMENT: ? ?CLINICAL IMPRESSION: ?Patient was diagnosed on 02/19/2022 with 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 It measures 1.1 cm and is located in the upper outer quadrant. It is ER/PR positive and HER2 negative with an unknown Ki67. Her multidisciplinary medical team met prior to her  assessments to determine a recommended treatment plan. She is planning to have a right lumpectomy and sentinel node biopsy followed by possible radiation and anti-estrogen therapy. She will benefit from a post op PT reas

## 2022-04-04 NOTE — Progress Notes (Signed)
[facility] Psychosocial Distress Screening ?Spiritual Care ? ?Met with Cheetara and her husband Eliseo Squires in [facility] to introduce [facility] team/resources, reviewing distress screen per protocol.  The patient scored a 9 on the Psychosocial Distress Thermometer which indicates severe distress. Also assessed for distress and other psychosocial needs.  ? ? 04/04/2022  ?ONCBCN DISTRESS SCREENING   ?Screening Type Initial Screening   ?Distress experienced in past week (1-10) 9 !   ?Information Concerns Type Lack of info about diagnosis;Lack of info about treatment;Lack of info about complementary therapy choices   ?Physical Problem type Sleep/insomnia   ?Referral to support programs Yes   ?  ?Ms Kathol notes that her distress has decreased to a 1 now that she has met her team and learned about the scope of her diagnosis and treatment. She is greatly relieved to avoid chemotherapy because she tends to have very strong physical reactions to medications. Couple reports very strong faith, good pastoral and church support, and many people praying for them. ? ?Follow up needed: No. Ms Knabe plans to phone chaplain as needed/desired. ? ? ?Chaplain Lorrin Jackson, MDiv, [facility] ?Pager [phone] ?Voicemail [phone] ? ? ? ? ?  ?

## 2022-04-05 ENCOUNTER — Encounter: Payer: Self-pay | Admitting: Genetic Counselor

## 2022-04-05 ENCOUNTER — Encounter: Payer: Self-pay | Admitting: Radiation Oncology

## 2022-04-05 ENCOUNTER — Ambulatory Visit
Admission: RE | Admit: 2022-04-05 | Discharge: 2022-04-05 | Disposition: A | Discharge status: Home / Self Care | Payer: Medicare Other | Source: Ambulatory Visit | Attending: Hematology and Oncology | Admitting: Hematology and Oncology

## 2022-04-05 DIAGNOSIS — Z170 Estrogen receptor positive status [ER+]: Secondary | ICD-10-CM

## 2022-04-05 MED ORDER — GADOBUTROL 1 MMOL/ML IV SOLN
8.0000 mL | Freq: Once | INTRAVENOUS | Status: AC | PRN
  Administered 2022-04-05: 8 mL via INTRAVENOUS

## 2022-04-05 NOTE — Progress Notes (Signed)
REFERRING PROVIDER: Nicholas Lose, MD [place], [place] [address]  PRIMARY PROVIDER:  Adin Hector, MD  PRIMARY REASON FOR VISIT:  1. Malignant neoplasm of upper-outer quadrant of right breast in female, estrogen receptor positive ([place])   2. Family history of rectal cancer   3. Family history of prostate cancer in father     HISTORY OF PRESENT ILLNESS:   Katelyn Ball, a 76 y.o. female, was seen for a [place] cancer genetics consultation at the request of Dr. Caryl Comes due to a personal and family history of cancer.  Katelyn Ball presents to clinic today to discuss the possibility of a hereditary predisposition to cancer, to discuss genetic testing, and to further clarify her future cancer risks, as well as potential cancer risks for family members.   In May 2023, at the age of 71, Katelyn Ball was diagnosed with invasive mammary carcinoma of the right breast.   CANCER HISTORY:  Oncology History  Malignant neoplasm of upper-outer quadrant of right breast in female, estrogen receptor positive ([place])  03/26/2022 Initial Diagnosis   Screening detected right breast distortion at 12:00: 1.1 cm, axilla negative, ultrasound biopsy: Grade 1 ILC, ER greater than 90%, PR greater than 90%, HER2 negative, Ki-67 not done   04/04/2022 Cancer Staging   Staging form: Breast, AJCC 8th Edition - Clinical: Stage IA (cT1c, cN0, cM0, G1, ER+, PR+, HER2-) - Signed by Nicholas Lose, MD on 04/04/2022 Stage prefix: Initial diagnosis Histologic grading system: 3 grade system       RISK FACTORS:  Menarche was at age 5.  First live birth at age 48.  OCP use for approximately  15  years.  Uterus intact: no.  Menopausal status: postmenopausal.  HRT use:  30  years. Colonoscopy: yes;  reports multiple adenomas every colonoscopy . Mammogram within the last year: yes. 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   left posterior calf inferior to popliteal fossa   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   left upper cutaneous lip   Vitamin D deficiency     Past Surgical History:  Procedure Laterality Date   ABDOMINAL HYSTERECTOMY     BREAST BIOPSY Right 03/26/2022   u/s bx rt 12:00 5cmfn venus clip path pending   BREAST CYST ASPIRATION Left    BREAST CYST ASPIRATION Right 09/25/2017   2 FNA's done on cysts   COLONOSCOPY WITH PROPOFOL N/A 10/05/2015   Procedure: COLONOSCOPY WITH PROPOFOL;  Surgeon: Manya Silvas, MD;  Location: [place];  Service: Endoscopy;  Laterality: N/A;   ESOPHAGOGASTRODUODENOSCOPY (EGD) WITH PROPOFOL N/A 10/05/2015   Procedure: ESOPHAGOGASTRODUODENOSCOPY (EGD) WITH PROPOFOL;  Surgeon: Manya Silvas, MD;  Location: [facility] ENDOSCOPY;  Service: Endoscopy;  Laterality: N/A;   JOINT REPLACEMENT Bilateral 11/2012   LAMINECTOMY AND MICRODISCECTOMY LUMBAR SPINE  PILONIDAL CYST DRAINAGE     TONSILLECTOMY      Social History   Socioeconomic History   Marital status: Married    Spouse name: Not on file   Number of children: Not on file   Years of education: Not on file   Highest education level: Not on file  Occupational History   Not on file  Tobacco Use   Smoking status: Former    Packs/day: 0.50    Years: 40.00    Pack years: 20.00    Types: Cigarettes    Quit date: 10/03/2008    Years since quitting: 13.5   Smokeless tobacco: Never  Substance and  Sexual Activity   Alcohol use: No   Drug use: No   Sexual activity: Not on file  Other Topics Concern   Not on file  Social History Narrative   Not on file   Social Determinants of Health   Financial Resource Strain: Not on file  Food Insecurity: Not on file  Transportation Needs: Not on file  Physical Activity: Not on file  Stress: Not on file  Social Connections: Not on file     FAMILY HISTORY:  We obtained a detailed, 4-generation family history.  Significant diagnoses are listed below: Family History  Problem Relation Age of Onset   Rectal cancer Mother 18   Prostate cancer Father        dx. 61s, metastatic   Lymphoma Maternal Uncle    Breast cancer Neg Hx       Katelyn Ball's mother was diagnosed with rectal cancer at age 21, she died at 39. Her maternal uncle was diagnosed with leukemia at an unknown age, he is deceased. Her father was diagnosed with prostate cancer in his early 8s and died due to metastatic prostate cancer at age 76. Katelyn Ball is unaware of previous family history of genetic testing for hereditary cancer risks. There is no reported Ashkenazi Jewish ancestry.   GENETIC COUNSELING ASSESSMENT: Katelyn Ball is a 76 y.o. female with a personal and family history of cancer which is somewhat suggestive of a hereditary predisposition to cancer. We, therefore, discussed and recommended the following at today's visit.   DISCUSSION: We discussed that 5 - 10% of cancer is hereditary, with most cases of breast cancer associated with BRCA1/2.  There are other genes that can be associated with hereditary breast cancer syndromes.  We discussed that testing is beneficial for several reasons including knowing how to follow individuals after completing their treatment, identifying whether potential treatment options would be beneficial, and understanding if other family members could be at risk for cancer and allowing them to undergo genetic testing.   We reviewed the  characteristics, features and inheritance patterns of hereditary cancer syndromes. We also discussed genetic testing, including the appropriate family members to test, the process of testing, insurance coverage and turn-around-time for results. We discussed the implications of a negative, positive, carrier and/or variant of uncertain significant result. We recommended Katelyn Ball pursue genetic testing for a panel that includes genes associated with breast, prostate, and rectal cancer.   Katelyn Ball elected to have [facility] Panel. The CustomNext-Cancer+RNAinsight panel offered by [facility] includes sequencing and rearrangement analysis for the following 47 genes:  APC, ATM, AXIN2, BARD1, BMPR1A, BRCA1, BRCA2, BRIP1, CDH1, CDK4, CDKN2A, CHEK2, CTNNA1, DICER1, EPCAM, GREM1, HOXB13, KIT, MEN1, MLH1, MSH2, MSH3, MSH6, MUTYH, NBN, NF1, NTHL1, PALB2, PDGFRA, PMS2, POLD1, POLE, PTEN, RAD50, RAD51C, RAD51D, SDHA, SDHB, SDHC, SDHD, SMAD4, SMARCA4, STK11,  TP53, TSC1, TSC2, and VHL.  RNA data is routinely analyzed for use in variant interpretation for all genes.  Based on Katelyn Ball's personal and family history of cancer, she meets medical criteria for genetic testing. Despite that she meets criteria, she may still have an out of pocket cost. We discussed that if her out of pocket cost for testing is over $100, the laboratory will call and confirm whether she wants to proceed with testing.  If the out of pocket cost of testing is less than $100 she will be billed by the genetic testing laboratory.   PLAN: After considering the risks, benefits, and limitations, Katelyn Ball provided informed consent to pursue genetic testing and the blood sample was sent to [facility] for analysis of the CustomNext Panel. Results should be available within approximately 2-3 weeks' time, at which point they will be disclosed by telephone to Katelyn Ball, as will any additional recommendations warranted by these results. Ms.  Ball will receive a summary of her genetic counseling visit and a copy of her results once available. This information will also be available in Epic.   Katelyn Ball questions were answered to her satisfaction today. Our contact information was provided should additional questions or concerns arise. Thank you for the referral and allowing [place] to share in the care of your patient.   Lucille Passy, MS, [facility] Genetic Counselor [email or web address] (P) [phone]  The patient was seen for a total of 20 minutes in face-to-face genetic counseling. The patient brought her husband.  Drs. Lindi Adie and/or Burr Medico were available to discuss this case as needed.   _______________________________________________________________________ For Office Staff:  Number of people involved in session: 2 Was an Intern/ student involved with case: no

## 2022-04-06 ENCOUNTER — Encounter: Payer: Self-pay | Admitting: *Deleted

## 2022-04-06 ENCOUNTER — Telehealth: Payer: Self-pay | Admitting: *Deleted

## 2022-04-06 NOTE — Telephone Encounter (Signed)
Spoke with patient to follow up from [facility] 5/17 and assess navigation needs.  Discussed MRI results and the need for add bx on the right. She does not want another bx and would like to just move forward with Mastectomy with no reconstruction.  Informed I would let Dr. Ethlyn Gallery office know and to call her with appt. Encouraged her to call should she have any further questions.  Patient verbalized understanding.

## 2022-04-09 ENCOUNTER — Inpatient Hospital Stay: Payer: Medicare Other

## 2022-04-09 ENCOUNTER — Other Ambulatory Visit: Payer: Self-pay

## 2022-04-09 ENCOUNTER — Ambulatory Visit: Payer: Self-pay | Admitting: General Surgery

## 2022-04-09 DIAGNOSIS — Z170 Estrogen receptor positive status [ER+]: Secondary | ICD-10-CM

## 2022-04-09 LAB — RESEARCH LABS

## 2022-04-09 NOTE — Research (Signed)
[employer] 2021-05 - Specimen Collection Study to Evaluate Biomarkers in Subjects with Cancer   CONSENT: Patient Katelyn Ball was identified by Dr Lindi Adie as a potential candidate for the above listed study. This Clinical Research Nurse met with Katelyn Ball, [id number] on 04/09/22 in a manner and location that ensures patient privacy to discuss participation in the above listed research study. Patient is Unaccompanied. Patient was previously provided with informed consent documents. Patient confirmed they have read the informed consent documents.  As outlined in the informed consent form, this Nurse and [name] discussed the purpose of the research study, the investigational nature of the study, study procedures and requirements for study participation, potential risks and benefits of study participation, as well as alternatives to participation. This study is not blinded or double-blinded. The patient understands participation is voluntary and they may withdraw from study participation at any time. This study does not involve randomization.  This study does not involve an investigational drug or device. This study does not involve a placebo. Patient understands enrollment is pending full eligibility review.   Confidentiality and how the patient's information will be used as part of study participation were discussed. Patient was informed there is reimbursement provided for their time and effort spent on trial participation. The patient is encouraged to discuss research study participation with their insurance provider to determine what costs they may incur as part of study participation, including research related injury.    All questions were answered to patient's satisfaction. The informed consent with embedded HIPAA language was reviewed page by page.  The patient's mental and emotional status is appropriate to provide informed consent, and the patient verbalizes an  understanding of study participation.  Patient has agreed to participate in the above listed research study and has voluntarily signed the informed consent version 14 with embedded HIPAA language, version 14 (revised 18 Dec 2021)  on 04/09/22 at 1111AM. The patient was provided with a copy of the signed informed consent form with embedded HIPAA language for their reference. No study specific procedures were obtained prior to the signing of the informed consent document. Approximately 30 minutes were spent with the patient reviewing the informed consent documents.  Patient was not requested to complete a Release of Information form.  ELIGIBILITY: This Nurse has reviewed this patient's inclusion and exclusion criteria and confirmed Katelyn Ball is eligible for study participation. Patient will continue with enrollment.   Eligibility confirmed by treating investigator, who also agrees that patient should proceed with enrollment.   SPECIMEN COLLECTION: Research blood specimen was collected using fresh venipuncture. Patient tolerated well.   TISSUE REQUEST: Tissue request sent for block from patient's biopsy on 03/26/22.   GIFT CARD: Patient was given a $66 V[employer] gift card for her participation in the study.   DATA COLLECTION: Medical History:  High Blood Pressure  Yes Coronary Artery Disease Yes Lupus    No Rheumatoid Arthritis  No Diabetes   No      If yes, which type?      N/A Lynch Syndrome  No  Is the patient currently taking a magnesium supplement?   No If yes, dose and frequency? N/A  Does the patient have a personal history of cancer (greater than 5 years ago)?  No If yes, Cancer type and date of diagnosis?   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 Per the study team, as these were not invasive diagnoses (treated with surgical management),  they do not exclude her from participation.  Has this previous diagnosis been treated? N/A      If so,  treatment type? N/A   Start and end dates of last treatment cycle? N/A  Does the patient have a family history of cancer in 1st or 2nd degree relatives? Yes If yes, Relationship(s) and Cancer type(s)? Patient reports a history of colon cancer in her mother, prostate cancer in her father, breast cancer in her maternal aunt, and lymphoma in her maternal uncle.    Does the patient have history of alcohol consumption? No   If yes, current or former? N/A If former, year stopped? N/A Number of years? N/A Drinks per week? N/A  Does the patient have history of cigarette, cigar, pipe, or chewing tobacco use?  Yes  If yes, current for former? Former If yes, type (Cigarette, cigar, pipe, and/or chewing tobacco)? Cigarette   If former, year stopped? 2008 Number of years? 40 Packs/number/containers per day? 0.5  Patient was thanked for her time and voluntary participation in this study. Patient was provided direct contact information and is encouraged to contact this nurse for any questions or concerns.  Vickii Penna, RN, BSN, CPN Clinical Research Nurse I [phone]  04/09/2022 11:53 AM

## 2022-04-09 NOTE — Research (Signed)
[employer] 2021-05 - Specimen Collection Study to Evaluate Biomarkers in Subjects with Cancer     This Nurse has reviewed this patient's inclusion and exclusion criteria as a second review and confirms Katelyn Ball is eligible for study participation.  Patient may continue with enrollment.  Marjie Skiff Abdirahim Flavell, RN, BSN, [facility]  [facility] Clinical Research Nurse [place] [phone]  Pager [phone] 04/09/2022 11:47 AM

## 2022-04-12 ENCOUNTER — Encounter: Payer: Self-pay | Admitting: *Deleted

## 2022-04-13 ENCOUNTER — Telehealth: Payer: Self-pay | Admitting: Hematology and Oncology

## 2022-04-13 NOTE — Telephone Encounter (Signed)
.  Called patient to schedule appointment per 5/24 inbasket, patient is aware of date and time.   

## 2022-04-19 NOTE — Pre-Procedure Instructions (Signed)
Surgical Instructions    Your procedure is scheduled on Monday 04/23/22.   Report to [facility] Main Entrance "A" at 12:30 P.M., then check in with the Admitting office.  Call this number if you have problems the morning of surgery:  [phone]   If you have any questions prior to your surgery date call [phone]: Open Monday-Friday 8am-4pm    Remember:  Do not eat after midnight the night before your surgery  You may drink clear liquids until 11:30 A.M. the morning of your surgery.   Clear liquids allowed are: Water, Non-Citrus Juices (without pulp), Carbonated Beverages, Clear Tea, Black Coffee ONLY (NO MILK, CREAM OR POWDERED CREAMER of any kind), and Gatorade    Take these medicines the morning of surgery with A SIP OF WATER:   buPROPion (WELLBUTRIN XL)   metoprolol succinate (TOPROL-XL)  montelukast (SINGULAIR)  pantoprazole (PROTONIX)   As of today, STOP taking any Aspirin (unless otherwise instructed by your surgeon) Aleve, Naproxen, Ibuprofen, Motrin, Advil, Goody's, BC's, all herbal medications, fish oil, and all vitamins.           Do not wear jewelry or makeup Do not wear lotions, powders, perfumes/colognes, or deodorant. Do not shave 48 hours prior to surgery.  Men may shave face and neck. Do not bring valuables to the hospital. Do not wear nail polish, gel polish, artificial nails, or any other type of covering on natural nails (fingers and toes) If you have artificial nails or gel coating that need to be removed by a nail salon, please have this removed prior to surgery. Artificial nails or gel coating may interfere with anesthesia's ability to adequately monitor your vital signs.  [facility] is not responsible for any belongings or valuables. .   Do NOT Smoke (Tobacco/Vaping)  24 hours prior to your procedure  If you use a CPAP at night, you may bring your mask for your overnight stay.   Contacts, glasses, hearing aids, dentures or partials may not be worn  into surgery, please bring cases for these belongings   For patients admitted to the hospital, discharge time will be determined by your treatment team.   Patients discharged the day of surgery will not be allowed to drive home, and someone needs to stay with them for 24 hours.   SURGICAL WAITING ROOM VISITATION Patients having surgery or a procedure in a hospital may have two support people. Children under the age of 35 must have an adult with them who is not the patient. They may stay in the waiting area during the procedure and may switch out with other visitors. If the patient needs to stay at the hospital during part of their recovery, the visitor guidelines for inpatient rooms apply.  Please refer to the [facility] website for the visitor guidelines for Inpatients (after your surgery is over and you are in a regular room).       Special instructions:    Oral Hygiene is also important to reduce your risk of infection.  Remember - BRUSH YOUR TEETH THE MORNING OF SURGERY WITH YOUR REGULAR TOOTHPASTE   [facility]- Preparing For Surgery  Before surgery, you can play an important role. Because skin is not sterile, your skin needs to be as free of germs as possible. You can reduce the number of germs on your skin by washing with CHG (chlorahexidine gluconate) Soap before surgery.  CHG is an antiseptic cleaner which kills germs and bonds with the skin to continue killing germs even  after washing.     Please do not use if you have an allergy to CHG or antibacterial soaps. If your skin becomes reddened/irritated stop using the CHG.  Do not shave (including legs and underarms) for at least 48 hours prior to first CHG shower. It is OK to shave your face.  Please follow these instructions carefully.     Shower the NIGHT BEFORE SURGERY and the MORNING OF SURGERY with CHG Soap.   If you chose to wash your hair, wash your hair first as usual with your normal shampoo. After you shampoo,  rinse your hair and body thoroughly to remove the shampoo.  Then [employer] and genitals (private parts) with your normal soap and rinse thoroughly to remove soap.  After that Use CHG Soap as you would any other liquid soap. You can apply CHG directly to the skin and wash gently with a scrungie or a clean washcloth.   Apply the CHG Soap to your body ONLY FROM THE NECK DOWN.  Do not use on open wounds or open sores. Avoid contact with your eyes, ears, mouth and genitals (private parts). Wash Face and genitals (private parts)  with your normal soap.   Wash thoroughly, paying special attention to the area where your surgery will be performed.  Thoroughly rinse your body with warm water from the neck down.  DO NOT shower/wash with your normal soap after using and rinsing off the CHG Soap.  Pat yourself dry with a CLEAN TOWEL.  Wear CLEAN PAJAMAS to bed the night before surgery  Place CLEAN SHEETS on your bed the night before your surgery  DO NOT SLEEP WITH PETS.   Day of Surgery:  Take a shower with CHG soap. Wear Clean/Comfortable clothing the morning of surgery Do not apply any deodorants/lotions.   Remember to brush your teeth WITH YOUR REGULAR TOOTHPASTE.    If you received a COVID test during your pre-op visit, it is requested that you wear a mask when out in public, stay away from anyone that may not be feeling well, and notify your surgeon if you develop symptoms. If you have been in contact with anyone that has tested positive in the last 10 days, please notify your surgeon.    Please read over the following fact sheets that you were given.

## 2022-04-20 ENCOUNTER — Other Ambulatory Visit: Payer: Self-pay

## 2022-04-20 ENCOUNTER — Encounter (HOSPITAL_COMMUNITY): Payer: Self-pay

## 2022-04-20 ENCOUNTER — Encounter: Payer: Self-pay | Admitting: *Deleted

## 2022-04-20 ENCOUNTER — Telehealth: Payer: Self-pay | Admitting: Genetic Counselor

## 2022-04-20 ENCOUNTER — Encounter (HOSPITAL_COMMUNITY)
Admission: RE | Admit: 2022-04-20 | Discharge: 2022-04-20 | Disposition: A | Discharge status: Home / Self Care | Payer: Medicare Other | Source: Ambulatory Visit | Attending: General Surgery | Admitting: General Surgery

## 2022-04-20 ENCOUNTER — Encounter: Payer: Self-pay | Admitting: Genetic Counselor

## 2022-04-20 VITALS — BP 170/80 | HR 65 | Temp 97.6°F | Resp 17 | Ht 65.0 in | Wt 171.1 lb

## 2022-04-20 DIAGNOSIS — J45909 Unspecified asthma, uncomplicated: Secondary | ICD-10-CM | POA: Insufficient documentation

## 2022-04-20 DIAGNOSIS — Z01818 Encounter for other preprocedural examination: Secondary | ICD-10-CM | POA: Diagnosis not present

## 2022-04-20 DIAGNOSIS — D649 Anemia, unspecified: Secondary | ICD-10-CM | POA: Insufficient documentation

## 2022-04-20 DIAGNOSIS — R011 Cardiac murmur, unspecified: Secondary | ICD-10-CM | POA: Insufficient documentation

## 2022-04-20 DIAGNOSIS — Z1379 Encounter for other screening for genetic and chromosomal anomalies: Secondary | ICD-10-CM | POA: Insufficient documentation

## 2022-04-20 DIAGNOSIS — J449 Chronic obstructive pulmonary disease, unspecified: Secondary | ICD-10-CM | POA: Insufficient documentation

## 2022-04-20 DIAGNOSIS — C50911 Malignant neoplasm of unspecified site of right female breast: Secondary | ICD-10-CM | POA: Insufficient documentation

## 2022-04-20 DIAGNOSIS — R7303 Prediabetes: Secondary | ICD-10-CM | POA: Insufficient documentation

## 2022-04-20 DIAGNOSIS — E538 Deficiency of other specified B group vitamins: Secondary | ICD-10-CM | POA: Diagnosis not present

## 2022-04-20 DIAGNOSIS — I10 Essential (primary) hypertension: Secondary | ICD-10-CM | POA: Insufficient documentation

## 2022-04-20 HISTORY — DX: Prediabetes: R73.03

## 2022-04-20 HISTORY — DX: Other specified postprocedural states: R11.2

## 2022-04-20 HISTORY — DX: Anxiety disorder, unspecified: F41.9

## 2022-04-20 HISTORY — DX: Hypothyroidism, unspecified: E03.9

## 2022-04-20 HISTORY — DX: Other specified postprocedural states: Z98.890

## 2022-04-20 LAB — BASIC METABOLIC PANEL
Anion gap: 5 (ref 5–15)
BUN: 19 mg/dL (ref 8–23)
CO2: 29 mmol/L (ref 22–32)
Calcium: 9.7 mg/dL (ref 8.9–10.3)
Chloride: 105 mmol/L (ref 98–111)
Creatinine, Ser: 0.89 mg/dL (ref 0.44–1.00)
GFR, Estimated: >60 mL/min (ref >60)
Glucose, Bld: 94 mg/dL (ref 70–99)
Potassium: 4.5 mmol/L (ref 3.5–5.1)
Sodium: 139 mmol/L (ref 135–145)

## 2022-04-20 NOTE — Progress Notes (Signed)
Anesthesia Chart Review:   Case: [id number] Date/Time: 04/23/22 1415   Procedure: RIGHT MASTECTOMY AND  SENTINEL NODE BIOPSY (Right)   Anesthesia type: General   Pre-op diagnosis: RIGHT BREAST CANCER   Location: [place] OR ROOM 02 / [place] OR   Surgeons: Jovita Kussmaul, MD       DISCUSSION: - Pt is 76 years old with hx heart murmur, HTN, pre-diabetes, asthma, COPD, anemia, B12 deficiency  VS: BP (!) 170/80   Pulse 65   Temp 36.4 C (Oral)   Resp 17   Ht '5\' 5"'$  (1.651 m)   Wt 77.6 kg   SpO2 93%   BMI 28.47 kg/m   PROVIDERS: - PCP is Adin Hector, MD (notes in care everywhere)  - Cardiologist is Lujean Amel, MD who sees pt for ASVD found on CT of chest. Last office visit 08/07/21, 1 year f/u recommended (notes in care everywhere).    LABS: Labs reviewed: Acceptable for surgery. - CBC w/diff 04/04/22 (care everywhere): RBC 3.85  (all labs ordered are listed, but only abnormal results are displayed)  Labs Reviewed  BASIC METABOLIC PANEL     IMAGES: CT chest lung ca screen 12/07/21:  1. Lung-RADS 2, benign appearance or behavior. Continue annual screening with low-dose chest CT without contrast in 12 months. 2.  Emphysema (ICD10-J43.9) and Aortic Atherosclerosis (ICD10-170.0)   EKG 04/20/22: NSR. LAD. RBBB   CV: Nuclear stress test 07/31/21 (care everywhere): - Normal myocardial perfusion scan no evidence of stress-induced  myocardial ischemia ejection fraction 73% conclusion negative scan  Echo 07/31/21 (care everywhere): - Normal LV systolic function - Normal RV systolic function - No valvular stenosis - Mild AR, TR - Trivial MR - EF 55-60%   Past Medical History:  Diagnosis Date   Actinic keratosis    Anemia    Anxiety    Asthma    Atelectasis    B12 deficiency    Cancer (HCC)    skin   COPD (chronic obstructive pulmonary disease) (HCC)    DDD (degenerative disc disease), cervical    Dysrhythmia    GERD (gastroesophageal reflux disease)    History of  chicken pox    Hyperlipidemia    Hypertension    Hypothyroidism    Iritis    Meniere disease    PONV (postoperative nausea and vomiting)    Pre-diabetes    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   right upper arm, ATYPICAL SQUAMOUS PROLIFERATION, 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, re-biopsied and treated with [place] 11/15/21   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   left posterior calf inferior to popliteal fossa   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   left upper cutaneous lip   Vitamin D deficiency     Past Surgical History:  Procedure Laterality Date   ABDOMINAL HYSTERECTOMY     BACK SURGERY     BREAST BIOPSY Right 03/26/2022   u/s bx rt 12:00 5cmfn venus clip path pending   BREAST CYST ASPIRATION Left    BREAST CYST ASPIRATION Right 09/25/2017   2 FNA's done on cysts   CHOLECYSTECTOMY     COLONOSCOPY WITH PROPOFOL N/A 10/05/2015   Procedure: COLONOSCOPY WITH PROPOFOL;  Surgeon: Manya Silvas, MD;  Location[facility] ENDOSCOPY;  Service: Endoscopy;  Laterality: N/A;   ESOPHAGOGASTRODUODENOSCOPY (EGD) WITH PROPOFOL N/A 10/05/2015   Procedure: ESOPHAGOGASTRODUODENOSCOPY (EGD) WITH PROPOFOL;  Surgeon: Manya Silvas, MD;  Location: [facility] ENDOSCOPY;  Service: Endoscopy;  Laterality:  N/A;   JOINT REPLACEMENT Bilateral 11/2012   LAMINECTOMY AND MICRODISCECTOMY LUMBAR SPINE     PILONIDAL CYST DRAINAGE     TONSILLECTOMY      MEDICATIONS:  acidophilus (RISAQUAD) CAPS capsule   BLACK CURRANT SEED OIL PO   buPROPion (WELLBUTRIN XL) 150 MG 24 hr tablet   Cholecalciferol 125 MCG (5000 UT) TABS   clonazePAM (KLONOPIN) 1 MG tablet   Cyanocobalamin 5000 MCG SUBL   fenofibrate 160 MG tablet   gabapentin (NEURONTIN) 100 MG capsule   gabapentin (NEURONTIN) 400 MG capsule   hydrocortisone 2.5 % cream   metoprolol succinate (TOPROL-XL) 25 MG 24 hr tablet   montelukast (SINGULAIR) 10 MG tablet   mupirocin ointment (BACTROBAN) 2 %    pantoprazole (PROTONIX) 40 MG tablet   rosuvastatin (CRESTOR) 5 MG tablet   thyroid (ARMOUR) 30 MG tablet   venlafaxine XR (EFFEXOR-XR) 37.5 MG 24 hr capsule   No current facility-administered medications for this encounter.    If no changes, I anticipate pt can proceed with surgery as scheduled.   Willeen Cass, PhD, FNP-BC [facility]/Anesthesiology Phone: [phone] 04/20/2022 2:05 PM

## 2022-04-20 NOTE — Progress Notes (Signed)
PCP - Cheryll Cockayne, MD Cardiologist - Clayborn Bigness, Landry Dyke, MD Oncologist - Nicholas Lose, MD  PPM/ICD - denies Device Orders - n/a Rep Notified - n/a  Chest x-ray - n/a EKG - 04/20/2022 Stress Test - 08/10/202 ECHO - 07/31/2021 Cardiac Cath - denies  Sleep Study - denies CPAP - n/a  Fasting Blood Sugar - n/a  Blood Thinner Instructions: n/a  Aspirin Instructions: Patient was instructed: As of today, STOP taking any Aspirin (unless otherwise instructed by your surgeon) Aleve, Naproxen, Ibuprofen, Motrin, Advil, Goody's, BC's, all herbal medications, fish oil, and all vitamins.  ERAS Protcol - yes, until 11:30 o'clock   COVID TEST- n/a  Anesthesia review: yes - the chart was reviewed by NP when patient was in PAT. BP 170/80 when patient arrived in PAT. Patient verbalized that her BP is elevated all the time when she is going to the doctor. Patient denied any distress. When rechecked, BP was 167/83.     Patient denies shortness of breath, fever, cough and chest pain at PAT appointment   All instructions explained to the patient, with a verbal understanding of the material. Patient agrees to go over the instructions while at home for a better understanding. Patient also instructed to self quarantine after being tested for COVID-19. The opportunity to ask questions was provided.

## 2022-04-20 NOTE — Telephone Encounter (Signed)
I contacted Ms. Leedy to discuss her genetic testing results. No pathogenic variants were identified in the 47 genes analyzed. Detailed clinic note to follow.  The test report has been scanned into EPIC and is located under the Molecular Pathology section of the Results Review tab.  A portion of the result report is included below for reference.   Lucille Passy, MS, [facility] Genetic Counselor [email or web address] (P) [phone]

## 2022-04-20 NOTE — Anesthesia Preprocedure Evaluation (Addendum)
Anesthesia Evaluation  Patient identified by MRN, date of birth, ID band Patient awake    Reviewed: Allergy & Precautions, NPO status , Patient's Chart, lab work & pertinent test results  History of Anesthesia Complications (+) PONV and history of anesthetic complications  Airway Mallampati: II  TM Distance: >3 FB Neck ROM: Full    Dental no notable dental hx. (+) Dental Advisory Given, Teeth Intact   Pulmonary asthma , COPD, former smoker,    Pulmonary exam normal breath sounds clear to auscultation       Cardiovascular hypertension, Pt. on home beta blockers Normal cardiovascular exam+ dysrhythmias  Rhythm:Regular Rate:Normal     Neuro/Psych PSYCHIATRIC DISORDERS Anxiety negative neurological ROS     GI/Hepatic negative GI ROS, Neg liver ROS, GERD  ,  Endo/Other  negative endocrine ROSHypothyroidism   Renal/GU negative Renal ROS     Musculoskeletal  (+) Arthritis ,   Abdominal   Peds  Hematology  (+) Blood dyscrasia, anemia ,   Anesthesia Other Findings   Reproductive/Obstetrics                                                           Anesthesia Evaluation  Patient identified by MRN, date of birth, ID band Patient awake    Reviewed: Allergy & Precautions, NPO status , Patient's Chart, lab work & pertinent test results, reviewed documented beta blocker date and time   Airway Mallampati: II  TM Distance: >3 FB     Dental no notable dental hx.    Pulmonary asthma , COPD,  COPD inhaler, former smoker,    Pulmonary exam normal        Cardiovascular hypertension, Pt. on medications and Pt. on home beta blockers Normal cardiovascular exam+ dysrhythmias      Neuro/Psych negative neurological ROS  negative psych ROS   GI/Hepatic Neg liver ROS, GERD  Medicated and Controlled,  Endo/Other  negative endocrine ROS  Renal/GU negative Renal ROS  negative  genitourinary   Musculoskeletal  (+) Arthritis , Osteoarthritis,    Abdominal Normal abdominal exam  (+)   Peds negative pediatric ROS (+)  Hematology  (+) anemia ,   Anesthesia Other Findings meniere's disease, Vitamin D deficiency, iritis  Reproductive/Obstetrics                           Anesthesia Physical Anesthesia Plan  ASA: III  Anesthesia Plan: General   Post-op Pain Management:    Induction: Intravenous  Airway Management Planned: Nasal Cannula  Additional Equipment:   Intra-op Plan:   Post-operative Plan:   Informed Consent: I have reviewed the patients History and Physical, chart, labs and discussed the procedure including the risks, benefits and alternatives for the proposed anesthesia with the patient or authorized representative who has indicated his/her understanding and acceptance.   Dental advisory given  Plan Discussed with: CRNA and Surgeon  Anesthesia Plan Comments:         Anesthesia Quick Evaluation  Anesthesia Physical Anesthesia Plan  ASA: 3  Anesthesia Plan: General   Post-op Pain Management: Tylenol PO (pre-op)*, Regional block* and Gabapentin PO (pre-op)*   Induction: Intravenous  PONV Risk Score and Plan: 4 or greater and Ondansetron, Dexamethasone, Treatment may vary due to age or medical condition, Midazolam, Scopolamine  patch - Pre-op and Aprepitant  Airway Management Planned: Oral ETT  Additional Equipment: None  Intra-op Plan:   Post-operative Plan: Extubation in OR  Informed Consent: I have reviewed the patients History and Physical, chart, labs and discussed the procedure including the risks, benefits and alternatives for the proposed anesthesia with the patient or authorized representative who has indicated his/her understanding and acceptance.     Dental advisory given  Plan Discussed with: CRNA  Anesthesia Plan Comments: (See APP note by Durel Salts, FNP )     Anesthesia Quick  Evaluation

## 2022-04-23 ENCOUNTER — Ambulatory Visit (HOSPITAL_BASED_OUTPATIENT_CLINIC_OR_DEPARTMENT_OTHER): Payer: Medicare Other | Admitting: Anesthesiology

## 2022-04-23 ENCOUNTER — Encounter (HOSPITAL_COMMUNITY)
Admission: RE | Disposition: A | Discharge status: Home / Self Care | Payer: Self-pay | Source: Home / Self Care | Attending: General Surgery

## 2022-04-23 ENCOUNTER — Other Ambulatory Visit: Payer: Self-pay

## 2022-04-23 ENCOUNTER — Ambulatory Visit (HOSPITAL_COMMUNITY): Payer: Medicare Other | Admitting: Emergency Medicine

## 2022-04-23 ENCOUNTER — Encounter (HOSPITAL_COMMUNITY): Payer: Self-pay | Admitting: General Surgery

## 2022-04-23 ENCOUNTER — Ambulatory Visit (HOSPITAL_COMMUNITY)
Admission: RE | Admit: 2022-04-23 | Discharge: 2022-04-24 | Disposition: A | Discharge status: Home / Self Care | Payer: Medicare Other | Attending: General Surgery | Admitting: General Surgery

## 2022-04-23 DIAGNOSIS — C50911 Malignant neoplasm of unspecified site of right female breast: Secondary | ICD-10-CM

## 2022-04-23 DIAGNOSIS — R110 Nausea: Secondary | ICD-10-CM | POA: Insufficient documentation

## 2022-04-23 DIAGNOSIS — Z170 Estrogen receptor positive status [ER+]: Secondary | ICD-10-CM | POA: Insufficient documentation

## 2022-04-23 DIAGNOSIS — K219 Gastro-esophageal reflux disease without esophagitis: Secondary | ICD-10-CM | POA: Diagnosis not present

## 2022-04-23 DIAGNOSIS — F419 Anxiety disorder, unspecified: Secondary | ICD-10-CM

## 2022-04-23 DIAGNOSIS — R7303 Prediabetes: Secondary | ICD-10-CM | POA: Diagnosis not present

## 2022-04-23 DIAGNOSIS — I2510 Atherosclerotic heart disease of native coronary artery without angina pectoris: Secondary | ICD-10-CM | POA: Insufficient documentation

## 2022-04-23 DIAGNOSIS — I10 Essential (primary) hypertension: Secondary | ICD-10-CM | POA: Diagnosis not present

## 2022-04-23 DIAGNOSIS — Z8249 Family history of ischemic heart disease and other diseases of the circulatory system: Secondary | ICD-10-CM | POA: Diagnosis not present

## 2022-04-23 DIAGNOSIS — I129 Hypertensive chronic kidney disease with stage 1 through stage 4 chronic kidney disease, or unspecified chronic kidney disease: Secondary | ICD-10-CM | POA: Diagnosis not present

## 2022-04-23 DIAGNOSIS — Z833 Family history of diabetes mellitus: Secondary | ICD-10-CM | POA: Insufficient documentation

## 2022-04-23 DIAGNOSIS — Z79899 Other long term (current) drug therapy: Secondary | ICD-10-CM | POA: Insufficient documentation

## 2022-04-23 DIAGNOSIS — R1110 Vomiting, unspecified: Secondary | ICD-10-CM | POA: Diagnosis not present

## 2022-04-23 DIAGNOSIS — M1990 Unspecified osteoarthritis, unspecified site: Secondary | ICD-10-CM | POA: Insufficient documentation

## 2022-04-23 DIAGNOSIS — N189 Chronic kidney disease, unspecified: Secondary | ICD-10-CM | POA: Insufficient documentation

## 2022-04-23 DIAGNOSIS — Z87891 Personal history of nicotine dependence: Secondary | ICD-10-CM | POA: Diagnosis not present

## 2022-04-23 DIAGNOSIS — D649 Anemia, unspecified: Secondary | ICD-10-CM | POA: Insufficient documentation

## 2022-04-23 DIAGNOSIS — C50411 Malignant neoplasm of upper-outer quadrant of right female breast: Secondary | ICD-10-CM | POA: Insufficient documentation

## 2022-04-23 DIAGNOSIS — R922 Inconclusive mammogram: Secondary | ICD-10-CM | POA: Insufficient documentation

## 2022-04-23 DIAGNOSIS — J449 Chronic obstructive pulmonary disease, unspecified: Secondary | ICD-10-CM | POA: Insufficient documentation

## 2022-04-23 DIAGNOSIS — E039 Hypothyroidism, unspecified: Secondary | ICD-10-CM

## 2022-04-23 DIAGNOSIS — Z01818 Encounter for other preprocedural examination: Secondary | ICD-10-CM

## 2022-04-23 HISTORY — PX: MASTECTOMY W/ SENTINEL NODE BIOPSY: SHX2001

## 2022-04-23 HISTORY — PX: SENTINEL NODE BIOPSY: SHX6608

## 2022-04-23 SURGERY — MASTECTOMY WITH SENTINEL LYMPH NODE BIOPSY
Anesthesia: General | Site: Breast | Laterality: Right

## 2022-04-23 MED ORDER — CLONAZEPAM 0.5 MG PO TABS
1.0000 mg | ORAL_TABLET | Freq: Every day | ORAL | Status: DC
  Administered 2022-04-23: 1 mg via ORAL
  Filled 2022-04-23: qty 2

## 2022-04-23 MED ORDER — APREPITANT 40 MG PO CAPS
ORAL_CAPSULE | ORAL | Status: AC
  Filled 2022-04-23: qty 1

## 2022-04-23 MED ORDER — LIDOCAINE 2% (20 MG/ML) 5 ML SYRINGE
INTRAMUSCULAR | Status: AC
  Filled 2022-04-23: qty 5

## 2022-04-23 MED ORDER — ROCURONIUM BROMIDE 10 MG/ML (PF) SYRINGE
PREFILLED_SYRINGE | INTRAVENOUS | Status: DC | PRN
  Administered 2022-04-23: 60 mg via INTRAVENOUS

## 2022-04-23 MED ORDER — BUPROPION HCL ER (XL) 150 MG PO TB24
150.0000 mg | ORAL_TABLET | Freq: Every day | ORAL | Status: DC
  Administered 2022-04-24: 150 mg via ORAL
  Filled 2022-04-23: qty 1

## 2022-04-23 MED ORDER — ENOXAPARIN SODIUM 40 MG/0.4ML IJ SOSY
40.0000 mg | PREFILLED_SYRINGE | INTRAMUSCULAR | Status: DC
  Administered 2022-04-24: 40 mg via SUBCUTANEOUS
  Filled 2022-04-23: qty 0.4

## 2022-04-23 MED ORDER — GABAPENTIN 100 MG PO CAPS
100.0000 mg | ORAL_CAPSULE | ORAL | Status: AC
  Administered 2022-04-23: 100 mg via ORAL

## 2022-04-23 MED ORDER — FENTANYL CITRATE (PF) 100 MCG/2ML IJ SOLN: 25.0000 ug | INTRAMUSCULAR | Status: DC | PRN

## 2022-04-23 MED ORDER — ORAL CARE MOUTH RINSE: 15.0000 mL | Freq: Once | OROMUCOSAL | Status: AC

## 2022-04-23 MED ORDER — 0.9 % SODIUM CHLORIDE (POUR BTL) OPTIME
TOPICAL | Status: DC | PRN
  Administered 2022-04-23: 1000 mL

## 2022-04-23 MED ORDER — METOPROLOL SUCCINATE ER 25 MG PO TB24
37.5000 mg | ORAL_TABLET | Freq: Every day | ORAL | Status: DC
  Administered 2022-04-24: 37.5 mg via ORAL
  Filled 2022-04-23: qty 2

## 2022-04-23 MED ORDER — MIDAZOLAM HCL 2 MG/2ML IJ SOLN
INTRAMUSCULAR | Status: AC
  Filled 2022-04-23: qty 2

## 2022-04-23 MED ORDER — CEFAZOLIN SODIUM-DEXTROSE 2-4 GM/100ML-% IV SOLN
INTRAVENOUS | Status: AC
  Filled 2022-04-23: qty 100

## 2022-04-23 MED ORDER — EPHEDRINE SULFATE-NACL 50-0.9 MG/10ML-% IV SOSY
PREFILLED_SYRINGE | INTRAVENOUS | Status: DC | PRN
  Administered 2022-04-23 (×2): 10 mg via INTRAVENOUS

## 2022-04-23 MED ORDER — SODIUM CHLORIDE 0.9 % IV SOLN: INTRAVENOUS | Status: DC

## 2022-04-23 MED ORDER — LACTATED RINGERS IV SOLN: INTRAVENOUS | Status: DC

## 2022-04-23 MED ORDER — CLONIDINE HCL (ANALGESIA) 100 MCG/ML EP SOLN
EPIDURAL | Status: DC | PRN
  Administered 2022-04-23: 80 ug

## 2022-04-23 MED ORDER — FENTANYL CITRATE (PF) 100 MCG/2ML IJ SOLN: 50.0000 ug | Freq: Once | INTRAMUSCULAR | Status: AC

## 2022-04-23 MED ORDER — OXYCODONE HCL 5 MG PO TABS: 5.0000 mg | ORAL_TABLET | Freq: Once | ORAL | Status: DC | PRN

## 2022-04-23 MED ORDER — APREPITANT 40 MG PO CAPS
40.0000 mg | ORAL_CAPSULE | Freq: Once | ORAL | Status: AC
  Administered 2022-04-23: 40 mg via ORAL

## 2022-04-23 MED ORDER — CHLORHEXIDINE GLUCONATE CLOTH 2 % EX PADS: 6.0000 | MEDICATED_PAD | Freq: Once | CUTANEOUS | Status: DC

## 2022-04-23 MED ORDER — PROPOFOL 10 MG/ML IV BOLUS
INTRAVENOUS | Status: AC
Start: 2022-04-23 — End: ?
  Filled 2022-04-23: qty 20

## 2022-04-23 MED ORDER — EPHEDRINE 5 MG/ML INJ
INTRAVENOUS | Status: AC
  Filled 2022-04-23: qty 5

## 2022-04-23 MED ORDER — GABAPENTIN 100 MG PO CAPS
ORAL_CAPSULE | ORAL | Status: AC
  Administered 2022-04-23: 500 mg
  Filled 2022-04-23: qty 1

## 2022-04-23 MED ORDER — ACETAMINOPHEN 500 MG PO TABS
1000.0000 mg | ORAL_TABLET | ORAL | Status: AC
  Administered 2022-04-23: 1000 mg via ORAL

## 2022-04-23 MED ORDER — ROCURONIUM BROMIDE 10 MG/ML (PF) SYRINGE
PREFILLED_SYRINGE | INTRAVENOUS | Status: AC
  Filled 2022-04-23: qty 10

## 2022-04-23 MED ORDER — GABAPENTIN 100 MG PO CAPS
ORAL_CAPSULE | ORAL | Status: AC
  Filled 2022-04-23: qty 1

## 2022-04-23 MED ORDER — ONDANSETRON 4 MG PO TBDP: 4.0000 mg | ORAL_TABLET | Freq: Four times a day (QID) | ORAL | Status: DC | PRN

## 2022-04-23 MED ORDER — CEFAZOLIN SODIUM-DEXTROSE 2-4 GM/100ML-% IV SOLN
2.0000 g | INTRAVENOUS | Status: AC
  Administered 2022-04-23: 2 g via INTRAVENOUS

## 2022-04-23 MED ORDER — VENLAFAXINE HCL ER 37.5 MG PO CP24
37.5000 mg | ORAL_CAPSULE | Freq: Every day | ORAL | Status: DC
  Administered 2022-04-24: 37.5 mg via ORAL
  Filled 2022-04-23: qty 1

## 2022-04-23 MED ORDER — OXYCODONE HCL 5 MG/5ML PO SOLN: 5.0000 mg | Freq: Once | ORAL | Status: DC | PRN

## 2022-04-23 MED ORDER — DEXAMETHASONE SODIUM PHOSPHATE 4 MG/ML IJ SOLN
INTRAMUSCULAR | Status: DC | PRN
  Administered 2022-04-23: 5 mg via PERINEURAL
  Administered 2022-04-23: 4 mg via PERINEURAL

## 2022-04-23 MED ORDER — FENTANYL CITRATE (PF) 250 MCG/5ML IJ SOLN
INTRAMUSCULAR | Status: DC | PRN
  Administered 2022-04-23 (×3): 50 ug via INTRAVENOUS

## 2022-04-23 MED ORDER — SUGAMMADEX SODIUM 200 MG/2ML IV SOLN
INTRAVENOUS | Status: DC | PRN
  Administered 2022-04-23: 200 mg via INTRAVENOUS

## 2022-04-23 MED ORDER — ROSUVASTATIN CALCIUM 5 MG PO TABS: 5.0000 mg | ORAL_TABLET | ORAL | Status: DC

## 2022-04-23 MED ORDER — METHOCARBAMOL 500 MG PO TABS: 500.0000 mg | ORAL_TABLET | Freq: Four times a day (QID) | ORAL | Status: DC | PRN

## 2022-04-23 MED ORDER — THYROID 60 MG PO TABS
90.0000 mg | ORAL_TABLET | Freq: Every day | ORAL | Status: DC
  Administered 2022-04-24: 90 mg via ORAL
  Filled 2022-04-23: qty 1

## 2022-04-23 MED ORDER — SODIUM CHLORIDE 0.9 % IV SOLN
12.5000 mg | Freq: Four times a day (QID) | INTRAVENOUS | Status: DC | PRN
  Administered 2022-04-23 – 2022-04-24 (×2): 12.5 mg via INTRAVENOUS
  Filled 2022-04-23 (×2): qty 0.5

## 2022-04-23 MED ORDER — ONDANSETRON HCL 4 MG/2ML IJ SOLN
INTRAMUSCULAR | Status: AC
  Filled 2022-04-23: qty 2

## 2022-04-23 MED ORDER — MORPHINE SULFATE (PF) 2 MG/ML IV SOLN: 1.0000 mg | INTRAVENOUS | Status: DC | PRN

## 2022-04-23 MED ORDER — ACETAMINOPHEN 500 MG PO TABS
ORAL_TABLET | ORAL | Status: AC
  Filled 2022-04-23: qty 2

## 2022-04-23 MED ORDER — ONDANSETRON HCL 4 MG/2ML IJ SOLN
4.0000 mg | Freq: Four times a day (QID) | INTRAMUSCULAR | Status: DC | PRN
  Administered 2022-04-23: 4 mg via INTRAVENOUS
  Filled 2022-04-23: qty 2

## 2022-04-23 MED ORDER — LIDOCAINE 2% (20 MG/ML) 5 ML SYRINGE
INTRAMUSCULAR | Status: DC | PRN
  Administered 2022-04-23: 80 mg via INTRAVENOUS

## 2022-04-23 MED ORDER — FENTANYL CITRATE (PF) 100 MCG/2ML IJ SOLN
INTRAMUSCULAR | Status: AC
  Administered 2022-04-23: 50 ug via INTRAVENOUS
  Filled 2022-04-23: qty 2

## 2022-04-23 MED ORDER — CHLORHEXIDINE GLUCONATE 0.12 % MT SOLN
15.0000 mL | Freq: Once | OROMUCOSAL | Status: AC
  Administered 2022-04-23: 15 mL via OROMUCOSAL

## 2022-04-23 MED ORDER — DEXAMETHASONE SODIUM PHOSPHATE 10 MG/ML IJ SOLN
INTRAMUSCULAR | Status: AC
  Filled 2022-04-23: qty 1

## 2022-04-23 MED ORDER — CHLORHEXIDINE GLUCONATE 0.12 % MT SOLN
OROMUCOSAL | Status: AC
  Filled 2022-04-23: qty 15

## 2022-04-23 MED ORDER — MONTELUKAST SODIUM 10 MG PO TABS
10.0000 mg | ORAL_TABLET | Freq: Every morning | ORAL | Status: DC
  Administered 2022-04-24: 10 mg via ORAL
  Filled 2022-04-23: qty 1

## 2022-04-23 MED ORDER — ROPIVACAINE HCL 5 MG/ML IJ SOLN
INTRAMUSCULAR | Status: DC | PRN
  Administered 2022-04-23: 30 mL via PERINEURAL

## 2022-04-23 MED ORDER — GABAPENTIN 400 MG PO CAPS
400.0000 mg | ORAL_CAPSULE | Freq: Every day | ORAL | Status: DC
  Administered 2022-04-24: 400 mg via ORAL
  Filled 2022-04-23: qty 1

## 2022-04-23 MED ORDER — GABAPENTIN 100 MG PO CAPS
100.0000 mg | ORAL_CAPSULE | Freq: Every day | ORAL | Status: DC
  Administered 2022-04-24: 100 mg via ORAL
  Filled 2022-04-23: qty 1

## 2022-04-23 MED ORDER — FENTANYL CITRATE (PF) 250 MCG/5ML IJ SOLN
INTRAMUSCULAR | Status: AC
  Filled 2022-04-23: qty 5

## 2022-04-23 MED ORDER — PROPOFOL 10 MG/ML IV BOLUS
INTRAVENOUS | Status: DC | PRN
  Administered 2022-04-23: 90 mg via INTRAVENOUS

## 2022-04-23 MED ORDER — MAGTRACE LYMPHATIC TRACER
INTRAMUSCULAR | Status: DC | PRN
  Administered 2022-04-23: 2 mL via INTRAMUSCULAR

## 2022-04-23 MED ORDER — OXYCODONE HCL 5 MG PO TABS: 5.0000 mg | ORAL_TABLET | ORAL | Status: DC | PRN

## 2022-04-23 MED ORDER — PHENYLEPHRINE 80 MCG/ML (10ML) SYRINGE FOR IV PUSH (FOR BLOOD PRESSURE SUPPORT)
PREFILLED_SYRINGE | INTRAVENOUS | Status: AC
  Filled 2022-04-23: qty 10

## 2022-04-23 MED ORDER — PANTOPRAZOLE SODIUM 40 MG PO TBEC
40.0000 mg | DELAYED_RELEASE_TABLET | Freq: Every day | ORAL | Status: DC
  Administered 2022-04-24: 40 mg via ORAL
  Filled 2022-04-23: qty 1

## 2022-04-23 MED ORDER — AMISULPRIDE (ANTIEMETIC) 5 MG/2ML IV SOLN: 10.0000 mg | Freq: Once | INTRAVENOUS | Status: DC | PRN

## 2022-04-23 SURGICAL SUPPLY — 53 items
APPLIER CLIP 9.375 MED OPEN (MISCELLANEOUS) ×6
BAG COUNTER SPONGE SURGICOUNT (BAG) ×3 IMPLANT
BINDER BREAST LRG (GAUZE/BANDAGES/DRESSINGS) IMPLANT
BINDER BREAST XLRG (GAUZE/BANDAGES/DRESSINGS) ×1 IMPLANT
BIOPATCH RED 1 DISK 7.0 (GAUZE/BANDAGES/DRESSINGS) ×3 IMPLANT
CANISTER SUCT 3000ML PPV (MISCELLANEOUS) ×3 IMPLANT
CHLORAPREP W/TINT 26 (MISCELLANEOUS) ×3 IMPLANT
CLIP APPLIE 9.375 MED OPEN (MISCELLANEOUS) ×2 IMPLANT
CNTNR URN SCR LID CUP LEK RST (MISCELLANEOUS) ×2 IMPLANT
CONT SPEC 4OZ STRL OR WHT (MISCELLANEOUS) ×1
COVER PROBE W GEL 5X96 (DRAPES) ×3 IMPLANT
COVER SURGICAL LIGHT HANDLE (MISCELLANEOUS) ×3 IMPLANT
DERMABOND ADVANCED (GAUZE/BANDAGES/DRESSINGS) ×1
DERMABOND ADVANCED .7 DNX12 (GAUZE/BANDAGES/DRESSINGS) ×2 IMPLANT
DEVICE DSSCT PLSMBLD 3.0S LGHT (MISCELLANEOUS) ×2 IMPLANT
DRAIN CHANNEL 19F RND (DRAIN) ×3 IMPLANT
DRAPE CHEST BREAST 15X10 FENES (DRAPES) ×3 IMPLANT
DRSG PAD ABDOMINAL 8X10 ST (GAUZE/BANDAGES/DRESSINGS) ×3 IMPLANT
DRSG TEGADERM 4X4.75 (GAUZE/BANDAGES/DRESSINGS) ×3 IMPLANT
ELECT CAUTERY BLADE 6.4 (BLADE) ×3 IMPLANT
ELECT REM PT RETURN 9FT ADLT (ELECTROSURGICAL) ×3
ELECTRODE REM PT RTRN 9FT ADLT (ELECTROSURGICAL) ×2 IMPLANT
EVACUATOR SILICONE 100CC (DRAIN) ×3 IMPLANT
FILTER IN LINE W/DETACHED HOSE (FILTER) ×3 IMPLANT
GAUZE SPONGE 4X4 12PLY STRL (GAUZE/BANDAGES/DRESSINGS) ×1 IMPLANT
GLOVE BIO SURGEON STRL SZ7.5 (GLOVE) ×3 IMPLANT
GOWN STRL REUS W/ TWL LRG LVL3 (GOWN DISPOSABLE) ×4 IMPLANT
GOWN STRL REUS W/TWL LRG LVL3 (GOWN DISPOSABLE) ×2
KIT BASIN OR (CUSTOM PROCEDURE TRAY) ×3 IMPLANT
KIT TURNOVER KIT B (KITS) ×3 IMPLANT
NDL FILTER BLUNT 18X1 1/2 (NEEDLE) IMPLANT
NDL HYPO 25GX1X1/2 BEV (NEEDLE) IMPLANT
NEEDLE FILTER BLUNT 18X 1/2SAF (NEEDLE) ×1
NEEDLE FILTER BLUNT 18X1 1/2 (NEEDLE) ×2 IMPLANT
NEEDLE HYPO 25GX1X1/2 BEV (NEEDLE) ×3 IMPLANT
NS IRRIG 1000ML POUR BTL (IV SOLUTION) ×3 IMPLANT
PACK GENERAL/GYN (CUSTOM PROCEDURE TRAY) ×3 IMPLANT
PAD ARMBOARD 7.5X6 YLW CONV (MISCELLANEOUS) ×3 IMPLANT
PENCIL SMOKE EVACUATOR (MISCELLANEOUS) ×3 IMPLANT
PIN SAFETY STERILE (MISCELLANEOUS) ×1 IMPLANT
PLASMABLADE 3.0S W/LIGHT (MISCELLANEOUS) ×3
SUT ETHILON 3 0 FSL (SUTURE) ×3 IMPLANT
SUT MNCRL AB 4-0 PS2 18 (SUTURE) ×4 IMPLANT
SUT VIC AB 0 CT1 27 (SUTURE) ×2
SUT VIC AB 0 CT1 27XBRD ANBCTR (SUTURE) ×4 IMPLANT
SUT VIC AB 0 CT1 36 (SUTURE) ×1 IMPLANT
SUT VIC AB 3-0 54X BRD REEL (SUTURE) IMPLANT
SUT VIC AB 3-0 BRD 54 (SUTURE)
SUT VIC AB 3-0 SH 18 (SUTURE) ×3 IMPLANT
TOWEL GREEN STERILE (TOWEL DISPOSABLE) ×3 IMPLANT
TOWEL GREEN STERILE FF (TOWEL DISPOSABLE) ×3 IMPLANT
TRACER MAGTRACE VIAL (MISCELLANEOUS) ×1 IMPLANT
TUBE CONNECTING 12X1/4 (SUCTIONS) ×1 IMPLANT

## 2022-04-23 NOTE — H&P (Signed)
PROVIDER: Landry Corporal, MD  MRN: [id number] DOB: 05/22/1946 Subjective   Chief Complaint: Breast Cancer   History of Present Illness: Katelyn Ball is a 76 y.o. female who is seen today as an office consultation for evaluation of Breast Cancer .   We are asked to see the patient in consultation by Dr. Lindi Adie to evaluate her for a new right breast cancer. The patient is a 76 year old white female who recently went for a routine screening mammogram. At that time she was found to have a distortion in the upper portion of the right breast that measured 1.1 cm. The axilla looked negative. 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 She does have very dense breast tissue. She is a retired Marine scientist. She is otherwise in pretty good health and does not smoke.  Review of Systems: A complete review of systems was obtained from the patient. I have reviewed this information and discussed as appropriate with the patient. See HPI as well for other ROS.  ROS   Medical History: Past Medical History:  Diagnosis Date   Allergic rhinitis due to allergen   Allergy   Anemia   Arthritis   Asthma   Asthma without status asthmaticus   Atelectasis  Lingular versus fibrosis   B12 deficiency 09/15/2014   Chronic kidney disease   Colonic polyp   COPD (chronic obstructive pulmonary disease) (CMS-HCC)   DDD (degenerative disc disease), lumbar   Diverticulosis of large intestine without hemorrhage 10/18/2015  Evaluated by GI 11/16   Emphysema of lung (CMS-HCC) 2020   GERD (gastroesophageal reflux disease)   History of chicken pox   Hyperlipidemia  with marked hypertriglyceridemia   Hypertension   Iritis 2000   Meniere disease, left 2009   Migraine headache   Palpitations   Recurrent major depressive disorder, in full remission (CMS-HCC)   SCC (squamous cell carcinoma), arm, left  07/21/2019   Thyroid disease 2015   Vitamin D deficiency   Patient Active Problem List  Diagnosis   COPD (chronic obstructive pulmonary disease) (CMS-HCC)   Vitamin D deficiency   Anemia   GERD (gastroesophageal reflux disease)   Allergic rhinitis due to allergen   Colonic polyp   B12 deficiency   Essential hypertension   Pure hypercholesterolemia   Trochanteric bursitis of right hip   Lumbar radiculitis   Diverticulosis of large intestine without hemorrhage   Back pain with right-sided sciatica   Recurrent major depressive disorder, in full remission (CMS-HCC)   Aortic atherosclerosis (CMS-HCC)   Coronary artery calcification seen on CT scan   SCC (squamous cell carcinoma), arm, left   Myalgia   Prediabetes   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 (CMS-HCC)   Malignant neoplasm of upper-outer quadrant of right breast in female, estrogen receptor positive (CMS-HCC)   Past Surgical History:  Procedure Laterality Date   TONSILLECTOMY AND [place]   CHOLECYSTECTOMY 2006   COLONOSCOPY 08/06/2011  Adenomatous Polyp in 2004, [facility] (Mother): 06/08/2006, 01/02/2003, 07/27/1998, 08/28/1995; CBF 07/2016   EGD 08/06/2011  05/17/2007: No repeat per RTE   REPLACEMENT TOTAL KNEE Right 11/2012   COLONOSCOPY 10/05/2015  Lump in Terminal Ileum, PH Adenomatous Polyp, [facility] (Mother): CBF 09/2016 & after review of [place] EUS Jola Schmidt MD @ [facility]) will chg CBF to 09/2020 per RTE (dw)  EGD 10/05/2015  No repeat per RTE   COLONOSCOPY W/ENDOSCOPIC ULTRASOUND N/A 11/30/2015  Procedure: RECTAL EUS; Surgeon: Oran Rein, MD; Location: [place]; Service: Gastroenterology; Laterality: N/A; colonoscopy also   COLONOSCOPY 03/03/2020  TVA/Tubular adenoma of the colon/Repeat 14yr/[place]   EGD 03/03/2020  Fundic gland polyps/No Repeat/[place]   JOINT REPLACEMENT 2007, 20010   KNEE  ARTHROSCOPY Right 01/2009; 12/2011   LAMINECTOMY LUMBAR SPINE   REPLACEMENT TOTAL KNEE Left    Allergies  Allergen Reactions   Ciprofloxacin Unknown and Other (See Comments)  Other reaction(s): Unknown Unknown  Unknown   Cyclobenzaprine Nausea   Cymbalta [Duloxetine] Nausea   Duloxetine Hcl Nausea   Erythromycin Estolate Other (See Comments)  JAUNDICE Other reaction(s): Other (See Comments) JAUNDICE JAUNDICE   Fiorinal [Butalbital-Aspirin-Caffeine] Nausea   Fish Oil Nausea   Motrin [Ibuprofen] Nausea   Naproxen Nausea   Pneumococcal 23-Val Ps Vaccine Other (See Comments)  Severe local reaction Other reaction(s): Other (See Comments) Severe local reaction Severe local reaction   Meperidine Nausea and Other (See Comments)   Current Outpatient Medications on File Prior to Visit  Medication Sig Dispense Refill   aspirin 81 MG EC tablet Take 81 mg by mouth once daily (Patient not taking: Reported on 03/28/2022)   buPROPion (WELLBUTRIN XL) 150 MG XL tablet Take 1 tablet (150 mg total) by mouth once daily 30 tablet 11   cholecalciferol (VITAMIN D3) 5,000 unit capsule Take 5,000 Units by mouth once daily   clonazePAM (KLONOPIN) 1 MG tablet TAKE 1 TABLET BY MOUTH AT BEDTIME AS NEEDED FOR ANXIETY 90 tablet 1   cyanocobalamin, vitamin B-12, 5,000 mcg Subl Place under the tongue By mouth every other day   fenofibrate 160 MG tablet TAKE 1 TABLET BY MOUTH ONCE DAILY 90 tablet 3   gabapentin (NEURONTIN) 100 MG capsule Take 1 capsule (100 mg total) by mouth at bedtime Take this with your other prescription for a total of 500 mg by mouth at night 90 capsule 3   gabapentin (NEURONTIN) 400 MG capsule Take 1 capsule (400 mg total) by mouth at bedtime for 90 days Take this with your other prescription for a total of 500 mg by mouth at night 90 capsule 3   metoprolol succinate (TOPROL-XL) 25 MG XL tablet TAKE 1 AND 1/2 TABLETS BY MOUTH ONCE DAILY 135 tablet 3   montelukast (SINGULAIR) 10 mg tablet  TAKE 1 TABLET BY MOUTH ONCE DAILY 90 tablet 3   pantoprazole (PROTONIX) 40 MG DR tablet TAKE 1 TABLET BY MOUTH TWICE DAILY 180 tablet 3   rosuvastatin (CRESTOR) 5 MG tablet Take 1 tablet (5 mg total) by mouth once a week 12 tablet 3   thyroid (ARMOUR THYROID) 30 mg tablet TAKE 2 TABLETS BY MOUTH EVERY MORNING BEFORE BREAKFAST 180 tablet 3   No current facility-administered medications on file prior to visit.   Family History  Problem Relation Age of Onset   Kidney failure Father   High blood pressure (Hypertension) Father   Prostate cancer Father 726  Myocardial Infarction (Heart attack) Father   Colon cancer Mother 658 Rectal   High blood pressure (Hypertension) Mother   Asthma Mother   Diabetes type II Son   High blood pressure (Hypertension) Son   Colon polyps Son   High blood pressure (Hypertension) Son   Colon polyps Daughter    Social History   Tobacco Use  Smoking Status Former   Packs/day: 0.50   Years: 40.00   Pack years:  20.00   Types: Cigarettes   Start date: 11/19/1968   Quit date: 10/03/2008   Years since quitting: 13.5  Smokeless Tobacco Never  Tobacco Comments  quit in 2007, started back 11/2020    Social History   Socioeconomic History   Marital status: Married   Number of children: 3  Tobacco Use   Smoking status: Former  Packs/day: 0.50  Years: 40.00  Pack years: 20.00  Types: Cigarettes  Start date: 11/19/1968  Quit date: 10/03/2008  Years since quitting: 13.5   Smokeless tobacco: Never   Tobacco comments:  quit in 2007, started back 11/2020  Vaping Use   Vaping Use: Never used  Substance and Sexual Activity   Alcohol use: No   Drug use: No   Sexual activity: Never   Objective:  There were no vitals filed for this visit.  There is no height or weight on file to calculate BMI.  Physical Exam Vitals reviewed.  Constitutional:  General: She is not in acute distress. Appearance: Normal appearance.  HENT:  Head: Normocephalic and  atraumatic.  Right Ear: External ear normal.  Left Ear: External ear normal.  Nose: Nose normal.  Mouth/Throat:  Mouth: Mucous membranes are moist.  Pharynx: Oropharynx is clear.  Eyes:  General: No scleral icterus. Extraocular Movements: Extraocular movements intact.  Conjunctiva/sclera: Conjunctivae normal.  Pupils: Pupils are equal, round, and reactive to light.  Cardiovascular:  Rate and Rhythm: Normal rate and regular rhythm.  Pulses: Normal pulses.  Heart sounds: Normal heart sounds.  Pulmonary:  Effort: Pulmonary effort is normal. No respiratory distress.  Breath sounds: Normal breath sounds.  Abdominal:  General: Bowel sounds are normal.  Palpations: Abdomen is soft.  Tenderness: There is no abdominal tenderness.  Musculoskeletal:  General: No swelling, tenderness or deformity. Normal range of motion.  Cervical back: Normal range of motion and neck supple.  Skin: General: Skin is warm and dry.  Coloration: Skin is not jaundiced.  Neurological:  General: No focal deficit present.  Mental Status: She is alert and oriented to person, place, and time.  Psychiatric:  Mood and Affect: Mood normal.  Behavior: Behavior normal.     Breast: There is symmetric dense breast tissue in the upper portion of both breast. There is no palpable mass in either breast. There is no palpable axillary, supraclavicular, or cervical lymphadenopathy.  Labs, Imaging and Diagnostic Testing:  Assessment and Plan:   Diagnoses and all orders for this visit:  Malignant neoplasm of upper-outer quadrant of right breast in female, estrogen receptor positive (CMS-HCC)    The patient appears to have a 1.1 cm cancer with lobular features and favorable markers in the upper portion of the right breast. 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 She would also be a candidate for sentinel node biopsy as  well. I have discussed with her in detail the risks and benefits of the operation as well as some of the technical aspects and she understands and wishes to proceed. She is not interested in reconstruction. She may change her mind if the MRI looks favorable. She will let me know otherwise we will proceed with surgical planning. She will also meet with medical and radiation oncology to discuss adjuvant therapy as well as physical therapy for preoperative lymphedema testing

## 2022-04-23 NOTE — Transfer of Care (Signed)
Immediate Anesthesia Transfer of Care Note  Patient: Katelyn Ball  Procedure(s) Performed: RIGHT MASTECTOMY (Right: Breast) RIGHT SENTINEL NODE BIOPSY (Right: Axilla)  Patient Location: PACU  Anesthesia Type:General  Level of Consciousness: awake, drowsy, patient cooperative and responds to stimulation  Airway & Oxygen Therapy: Patient Spontanous Breathing and Patient connected to nasal cannula oxygen  Post-op Assessment: Report given to RN and Post -op Vital signs reviewed and stable  Post vital signs: Reviewed and stable  Last Vitals:  Vitals Value Taken Time  BP 167/67 04/23/22 1433  Temp    Pulse 62 04/23/22 1435  Resp 16 04/23/22 1435  SpO2 98 % 04/23/22 1435  Vitals shown include unvalidated device data.  Last Pain:  Vitals:   04/23/22 1136  TempSrc:   PainSc: 0-No pain         Complications: No notable events documented.

## 2022-04-23 NOTE — Op Note (Signed)
04/23/2022  2:21 PM  PATIENT:  Katelyn Ball  76 y.o. female  PRE-OPERATIVE DIAGNOSIS:  RIGHT BREAST CANCER  POST-OPERATIVE DIAGNOSIS:  RIGHT BREAST CANCER  PROCEDURE:  Procedure(s): RIGHT MASTECTOMY WITH DEEP RIGHT AXILLARY SENTINEL NODE BIOPSY (Right)  SURGEON:  Surgeon(s) and Role:    * Jovita Kussmaul, MD - Primary  PHYSICIAN ASSISTANT:   ASSISTANTS: none   ANESTHESIA:   general  EBL:  10 mL   BLOOD ADMINISTERED:none  DRAINS: (1) Blake drain(s) in the right prepectoral space    LOCAL MEDICATIONS USED:  NONE  SPECIMEN:  Source of Specimen:  right mastectomy and sentinel node x 4  DISPOSITION OF SPECIMEN:  PATHOLOGY  COUNTS:  YES  TOURNIQUET:  * No tourniquets in log *  DICTATION: .Dragon Dictation  After informed consent was obtained the patient was brought to the operating room and placed in the supine position on the operating table.  After adequate induction of general anesthesia the patient's right chest, breast, and axillary area were prepped with ChloraPrep, allowed to dry, and draped in usual sterile manner.  An appropriate timeout was performed.  At this point, 2 cc of iron oxide were injected into the subareolar plexus of the right breast and the breast was massaged for about 5 minutes.  Next an elliptical incision was made around the nipple and areola complex in order to minimize the excess skin with a 10 blade knife.  The incision was carried through the skin and subcutaneous tissue sharply with the PlasmaBlade.  Breast hooks were used to elevate the skin flaps anteriorly towards the ceiling.  Thin skin flaps were then created by dissecting between the breast tissue and the subcutaneous fat and skin.  This dissection was carried all the way to the chest wall circumferentially.  Next the breast was removed from the pectoralis muscle with the pectoralis fascia.  Once this was accomplished the entire right breast was removed from the patient.  The breast was marked  with a stitch on the lateral skin and sent to pathology for further evaluation.  The mag trace was then used to identify a signal in the right axilla.  I was already present in the deep right axillary space from the mastectomy.  I was able to identify for lymph nodes with signal.  Each of these was excised sharply with the PlasmaBlade and the surrounding small vessels and lymphatics were controlled with clips.  These were sent as sentinel nodes numbers 1 through 4.  No other hot or palpable nodes were identified in the right axilla.  Hemostasis was achieved using the PlasmaBlade.  The wound was irrigated with copious amounts of saline.  A small stab incision was then made near the anterior axillary line inferior to the operative bed.  Tonsil clamp was placed through this opening and used to bring a 19 [place] round Blake drain into the operative bed.  The drain was curled along the chest wall.  The lateral axillary tissue was tacked to the chest wall with interrupted 0 Vicryl stitches.  The drain was anchored to the skin with a 3-0 nylon stitch.  Next the superior and inferior skin flaps were grossly reapproximated with interrupted 3-0 Vicryl stitches.  The skin was then closed with running 4-0 Monocryl subcuticular stitches.  Dermabond dressings were applied.  Sterile drain dressings were applied.  The patient tolerated the procedure well.  At the end of the case all needle sponge and instrument counts were correct.  The patient was  then awakened and taken to recovery in stable condition.  The patient's skin flaps appeared healthy and viable.  The drain was placed to bulb suction and there was a good seal.  PLAN OF CARE: Admit for overnight observation  PATIENT DISPOSITION:  PACU - hemodynamically stable.   Delay start of Pharmacological VTE agent (>24hrs) due to surgical blood loss or risk of bleeding: no

## 2022-04-23 NOTE — Interval H&P Note (Signed)
History and Physical Interval Note:  04/23/2022 12:24 PM  Katelyn Ball  has presented today for surgery, with the diagnosis of RIGHT BREAST CANCER.  The various methods of treatment have been discussed with the patient and family. After consideration of risks, benefits and other options for treatment, the patient has consented to  Procedure(s): RIGHT MASTECTOMY AND  SENTINEL NODE BIOPSY (Right) as a surgical intervention.  The patient's history has been reviewed, patient examined, no change in status, stable for surgery.  I have reviewed the patient's chart and labs.  Questions were answered to the patient's satisfaction.     Autumn Messing III

## 2022-04-23 NOTE — Anesthesia Procedure Notes (Signed)
Anesthesia Regional Block: Pectoralis block   Pre-Anesthetic Checklist: , timeout performed,  Correct Patient, Correct Site, Correct Laterality,  Correct Procedure, Correct Position, site marked,  Risks and benefits discussed,  Surgical consent,  Pre-op evaluation,  At surgeon's request and post-op pain management  Laterality: Right  Prep: chloraprep       Needles:   Needle Type: Stimiplex     Needle Length: 9cm      Additional Needles:   Procedures:,,,, ultrasound used (permanent image in chart),,    Narrative:  Start time: 04/23/2022 11:58 AM End time: 04/23/2022 12:18 PM Injection made incrementally with aspirations every 5 mL.  Performed by: Personally  Anesthesiologist: Nolon Nations, MD  Additional Notes: BP cuff, SpO2 and EKG monitors applied. Sedation begun.  Anesthetic injected incrementally, slowly, and after neg aspirations under direct ultrasound guidance. Good fascial spread noted. Patient tolerated well.

## 2022-04-23 NOTE — Anesthesia Procedure Notes (Signed)
Procedure Name: Intubation Date/Time: 04/23/2022 12:50 PM Performed by: Cathren Harsh, CRNA Pre-anesthesia Checklist: Patient identified, Emergency Drugs available, Suction available and Patient being monitored Patient Re-evaluated:Patient Re-evaluated prior to induction Oxygen Delivery Method: Circle System Utilized Preoxygenation: Pre-oxygenation with 100% oxygen Induction Type: IV induction Ventilation: Mask ventilation without difficulty Laryngoscope Size: Glidescope and 3 Grade View: Grade I Tube type: Oral Tube size: 7.0 mm Number of attempts: 1 Airway Equipment and Method: Stylet and Oral airway Placement Confirmation: ETT inserted through vocal cords under direct vision, positive ETCO2 and breath sounds checked- equal and bilateral Secured at: 21 cm Tube secured with: Tape Dental Injury: Teeth and Oropharynx as per pre-operative assessment  Difficulty Due To: Difficulty was anticipated Comments: Initial DL yielded grade 3 view with mac 3 DL x one.  Esophageal intubation immediately recognized, OETT removed and glidescope obtained while mask ventilation resumed.  Second and successful intubation with glidescope 3 blade x one attempt.

## 2022-04-24 ENCOUNTER — Encounter (HOSPITAL_COMMUNITY): Payer: Self-pay | Admitting: General Surgery

## 2022-04-24 DIAGNOSIS — C50411 Malignant neoplasm of upper-outer quadrant of right female breast: Secondary | ICD-10-CM | POA: Diagnosis not present

## 2022-04-24 MED ORDER — OXYCODONE HCL 5 MG PO TABS: 5.0000 mg | ORAL_TABLET | Freq: Four times a day (QID) | ORAL | 0 refills | Status: DC | PRN

## 2022-04-24 MED ORDER — PROMETHAZINE HCL 25 MG RE SUPP: 25.0000 mg | Freq: Four times a day (QID) | RECTAL | 1 refills | Status: DC | PRN

## 2022-04-24 NOTE — Progress Notes (Signed)
Discharge instructions provided to patient, patient verbalizes understanding. Drain instructions provided to patient, patient demonstrated understanding. Follow up appointments reviewed, post mastectomy bag given to patient. Iv removed, belongings collected, patient discharged.

## 2022-04-24 NOTE — Plan of Care (Signed)
Gave pt Breast Cancer Bag and reviewed its contents. Reviewed JP drain care, arm precautions and follow up. 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 Husband at bedside.

## 2022-04-24 NOTE — Progress Notes (Signed)
1 Day Post-Op   Subjective/Chief Complaint: Nauseated after surgery. This seems to have resolved   Objective: Vital signs in last 24 hours: Temp:  [96.1 F (35.6 C)-97.9 F (36.6 C)] 97.8 F (36.6 C) (06/06 0627) Pulse Rate:  [56-73] 73 (06/06 0627) Resp:  [11-18] 16 (06/05 2130) BP: (116-181)/(50-76) 125/50 (06/06 0627) SpO2:  [93 %-100 %] 96 % (06/06 0627) Weight:  [77.1 kg] 77.1 kg (06/05 1059) Last BM Date : 04/22/22  Intake/Output from previous day: 06/05 0701 - 06/06 0700 In: 1050 [I.V.:1000; IV Piggyback:50] Out: 90 [Drains:80; Blood:10] Intake/Output this shift: No intake/output data recorded.  General appearance: alert and cooperative Resp: clear to auscultation bilaterally Chest wall: skin flaps look good Cardio: regular rate and rhythm GI: soft, non-tender; bowel sounds normal; no masses,  no organomegaly  Lab Results:  No results for input(s): WBC, HGB, HCT, PLT in the last 72 hours. BMET No results for input(s): NA, K, CL, CO2, GLUCOSE, BUN, CREATININE, CALCIUM in the last 72 hours. PT/INR No results for input(s): LABPROT, INR in the last 72 hours. ABG No results for input(s): PHART, HCO3 in the last 72 hours.  Invalid input(s): PCO2, PO2  Studies/Results: No results found.  Anti-infectives: Anti-infectives (From admission, onward)    Start     Dose/Rate Route Frequency Ordered Stop   04/23/22 1117  ceFAZolin (ANCEF) 2-4 GM/100ML-% IVPB       Note to Pharmacy: Barbie Haggis N: cabinet override      04/23/22 1117 04/23/22 1238   04/23/22 1100  ceFAZolin (ANCEF) IVPB 2g/100 mL premix        2 g 200 mL/hr over 30 Minutes Intravenous On call to O.R. 04/23/22 1055 04/23/22 1238       Assessment/Plan: s/p Procedure(s): RIGHT MASTECTOMY (Right) RIGHT SENTINEL NODE BIOPSY (Right) Advance diet Discharge Teach pt drain care  LOS: 0 days    Autumn Messing III 04/24/2022

## 2022-04-24 NOTE — Anesthesia Postprocedure Evaluation (Signed)
Anesthesia Post Note  Patient: Katelyn Ball  Procedure(s) Performed: RIGHT MASTECTOMY (Right: Breast) RIGHT SENTINEL NODE BIOPSY (Right: Axilla)     Patient location during evaluation: PACU Anesthesia Type: General Level of consciousness: sedated and patient cooperative Pain management: pain level controlled Vital Signs Assessment: post-procedure vital signs reviewed and stable Respiratory status: spontaneous breathing Cardiovascular status: stable Anesthetic complications: no   No notable events documented.  Last Vitals:  Vitals:   04/24/22 0028 04/24/22 0627  BP: (!) 116/54 (!) 125/50  Pulse: 60 73  Resp:    Temp: 36.6 C 36.6 C  SpO2: 93% 96%    Last Pain:  Vitals:   04/24/22 0627  TempSrc: Oral  PainSc:                  Nolon Nations

## 2022-04-26 ENCOUNTER — Encounter: Payer: Self-pay | Admitting: Genetic Counselor

## 2022-04-26 ENCOUNTER — Other Ambulatory Visit: Payer: Self-pay | Admitting: Hematology and Oncology

## 2022-04-27 ENCOUNTER — Ambulatory Visit: Payer: Self-pay | Admitting: Genetic Counselor

## 2022-04-27 DIAGNOSIS — Z1379 Encounter for other screening for genetic and chromosomal anomalies: Secondary | ICD-10-CM

## 2022-04-27 LAB — SURGICAL PATHOLOGY

## 2022-04-27 NOTE — Progress Notes (Signed)
HPI:   Katelyn Ball was previously seen in the [facility] due to a personal and family history of cancer and concerns regarding a hereditary predisposition to cancer. Please refer to our prior cancer genetics clinic note for more information regarding our discussion, assessment and recommendations, at the time. Katelyn Ball recent genetic test results were disclosed to her, as were recommendations warranted by these results. These results and recommendations are discussed in more detail below.  CANCER HISTORY:  Oncology History  Malignant neoplasm of upper-outer quadrant of right breast in female, estrogen receptor positive ([place])  03/26/2022 Initial Diagnosis   Screening detected right breast distortion at 12:00: 1.1 cm, axilla negative, ultrasound biopsy: Grade 1 ILC, ER greater than 90%, PR greater than 90%, HER2 negative, Ki-67 not done   04/04/2022 Cancer Staging   Staging form: Breast, AJCC 8th Edition - Clinical: Stage IA (cT1c, cN0, cM0, G1, ER+, PR+, HER2-) - Signed by Nicholas Lose, MD on 04/04/2022 Stage prefix: Initial diagnosis Histologic grading system: 3 grade system    Genetic Testing   [facility] CustomNext Panel was Negative. Report date is 04/19/2022.  The CustomNext-Cancer+RNAinsight panel offered by [facility] includes sequencing and rearrangement analysis for the following 47 genes:  APC, ATM, AXIN2, BARD1, BMPR1A, BRCA1, BRCA2, BRIP1, CDH1, CDK4, CDKN2A, CHEK2, CTNNA1, DICER1, EPCAM, GREM1, HOXB13, KIT, MEN1, MLH1, MSH2, MSH3, MSH6, MUTYH, NBN, NF1, NTHL1, PALB2, PDGFRA, PMS2, POLD1, POLE, PTEN, RAD50, RAD51C, RAD51D, SDHA, SDHB, SDHC, SDHD, SMAD4, SMARCA4, STK11, TP53, TSC1, TSC2, and VHL.  RNA data is routinely analyzed for use in variant interpretation for all genes.     FAMILY HISTORY:  We obtained a detailed, 4-generation family history.  Significant diagnoses are listed below:      Family History  Problem Relation Age of Onset   Rectal cancer  Mother 73   Prostate cancer Father          dx. 75s, metastatic   Lymphoma Maternal Uncle     Breast cancer Neg Hx           Ms. Ruud's mother was diagnosed with rectal cancer at age 14, she died at 51. Her maternal uncle was diagnosed with leukemia at an unknown age, he is deceased. Her father was diagnosed with prostate cancer in his early 61s and died due to metastatic prostate cancer at age 10. Katelyn Ball is unaware of previous family history of genetic testing for hereditary cancer risks. There is no reported Ashkenazi Jewish ancestry.   GENETIC TEST RESULTS:  The [facility] CustomNext Panel found no pathogenic mutations.   The CustomNext-Cancer+RNAinsight panel offered by [facility] includes sequencing and rearrangement analysis for the following 47 genes:  APC, ATM, AXIN2, BARD1, BMPR1A, BRCA1, BRCA2, BRIP1, CDH1, CDK4, CDKN2A, CHEK2, CTNNA1, DICER1, EPCAM, GREM1, HOXB13, KIT, MEN1, MLH1, MSH2, MSH3, MSH6, MUTYH, NBN, NF1, NTHL1, PALB2, PDGFRA, PMS2, POLD1, POLE, PTEN, RAD50, RAD51C, RAD51D, SDHA, SDHB, SDHC, SDHD, SMAD4, SMARCA4, STK11, TP53, TSC1, TSC2, and VHL.  RNA data is routinely analyzed for use in variant interpretation for all genes.  The test report has been scanned into EPIC and is located under the Molecular Pathology section of the Results Review tab.  A portion of the result report is included below for reference. Genetic testing reported out on 04/19/2022.        Even though a pathogenic variant was not identified, possible explanations for her personal history of cancer may include: There may be no hereditary risk for cancer in the family. The  cancers in Katelyn Ball and/or her family may be due to other genetic or environmental factors. There may be a gene mutation in one of these genes that current testing methods cannot detect, but that chance is small. There could be another gene that has not yet been discovered, or that we have not yet tested, that is responsible  for the cancer diagnoses in the family.   Therefore, it is important to remain in touch with cancer genetics in the future so that we can continue to offer Katelyn Ball the most up to date genetic testing.   ADDITIONAL GENETIC TESTING:  We discussed with [name] that her genetic testing was fairly extensive.  If there are genes identified to increase cancer risk that can be analyzed in the future, we would be happy to discuss and coordinate this testing at that time.    CANCER SCREENING RECOMMENDATIONS:  Ms. Mcenroe test result is considered negative (normal).  This means that we have not identified a hereditary cause for her personal and family history of cancer at this time. Most cancers happen by chance and this negative test suggests that her cancer may fall into this category.    An individual's cancer risk and medical management are not determined by genetic test results alone. Overall cancer risk assessment incorporates additional factors, including personal medical history, family history, and any available genetic information that may result in a personalized plan for cancer prevention and surveillance. Therefore, it is recommended she continue to follow the cancer management and screening guidelines provided by her oncology and primary healthcare provider.  RECOMMENDATIONS FOR FAMILY MEMBERS:   Since she did not inherit a mutation in a cancer predisposition gene included on this panel, her children could not have inherited a mutation from her in one of these genes.  FOLLOW-UP:  Cancer genetics is a rapidly advancing field and it is possible that new genetic tests will be appropriate for her and/or her family members in the future. We encouraged her to remain in contact with cancer genetics on an annual basis so we can update her personal and family histories and let her know of advances in cancer genetics that may benefit this family.   Our contact number was provided. Katelyn Ball  questions were answered to her satisfaction, and she knows she is welcome to call us at anytime with additional questions or concerns.   Lucille Passy, MS, [facility] Genetic Counselor [email or web address] ([phone]) [phone]

## 2022-04-30 ENCOUNTER — Encounter: Payer: Self-pay | Admitting: *Deleted

## 2022-05-01 NOTE — Progress Notes (Incomplete)
Patient Care Team: Adin Hector, MD as PCP - General (Internal Medicine) Mauro Kaufmann, RN as Oncology Nurse Navigator Rockwell Germany, RN as Oncology Nurse Navigator Jovita Kussmaul, MD as Consulting Physician (General Surgery) Nicholas Lose, MD as Consulting Physician (Hematology and Oncology) Eppie Gibson, MD as Attending Physician (Radiation Oncology)  DIAGNOSIS: No diagnosis found.  SUMMARY OF ONCOLOGIC HISTORY: Oncology History  Malignant neoplasm of upper-outer quadrant of right breast in female, estrogen receptor positive ([place])  03/26/2022 Initial Diagnosis   Screening detected right breast distortion at 12:00: 1.1 cm, axilla negative, ultrasound biopsy: Grade 1 ILC, ER greater than 90%, PR greater than 90%, HER2 negative, Ki-67 not done   04/04/2022 Cancer Staging   Staging form: Breast, AJCC 8th Edition - Clinical: Stage IA (cT1c, cN0, cM0, G1, ER+, PR+, HER2-) - Signed by Nicholas Lose, MD on 04/04/2022 Stage prefix: Initial diagnosis Histologic grading system: 3 grade system    Genetic Testing   [facility] CustomNext Panel was Negative. Report date is 04/19/2022.  The CustomNext-Cancer+RNAinsight panel offered by [facility] includes sequencing and rearrangement analysis for the following 47 genes:  APC, ATM, AXIN2, BARD1, BMPR1A, BRCA1, BRCA2, BRIP1, CDH1, CDK4, CDKN2A, CHEK2, CTNNA1, DICER1, EPCAM, GREM1, HOXB13, KIT, MEN1, MLH1, MSH2, MSH3, MSH6, MUTYH, NBN, NF1, NTHL1, PALB2, PDGFRA, PMS2, POLD1, POLE, PTEN, RAD50, RAD51C, RAD51D, SDHA, SDHB, SDHC, SDHD, SMAD4, SMARCA4, STK11, TP53, TSC1, TSC2, and VHL.  RNA data is routinely analyzed for use in variant interpretation for all genes.     CHIEF COMPLIANT: Follow-up after surgery  INTERVAL HISTORY: Katelyn Ball is a 76 y.o. female is here because of recent diagnosis of right breast cancer. She presents to the clinic today for a follow-up after surgery.   ALLERGIES:  is allergic to ciprofloxacin,  cyclobenzaprine, cymbalta [duloxetine hcl], demerol [meperidine], erythromycin, fiorinal [butalbital-aspirin-caffeine], fish oil, motrin [ibuprofen], and naproxen.  MEDICATIONS:  Current Outpatient Medications  Medication Sig Dispense Refill   acidophilus (RISAQUAD) CAPS capsule Take 1 capsule by mouth daily.     BLACK CURRANT SEED OIL PO Take 1 capsule by mouth in the morning and at bedtime.     buPROPion (WELLBUTRIN XL) 150 MG 24 hr tablet Take 150 mg by mouth daily.     Cholecalciferol 125 MCG (5000 UT) TABS Take 5,000 Units by mouth daily.     clonazePAM (KLONOPIN) 1 MG tablet Take 1 mg by mouth at bedtime.     Cyanocobalamin 5000 MCG SUBL Place 5,000 mcg under the tongue daily.     fenofibrate 160 MG tablet Take 160 mg by mouth at bedtime.     gabapentin (NEURONTIN) 100 MG capsule Take 100 mg by mouth at bedtime.     gabapentin (NEURONTIN) 400 MG capsule Take 400 mg by mouth at bedtime.     hydrocortisone 2.5 % cream Apply topically 2 (two) times daily. For 1 week. (Patient not taking: Reported on 04/13/2022) 30 g 1   metoprolol succinate (TOPROL-XL) 25 MG 24 hr tablet Take 37.5 mg by mouth daily.     montelukast (SINGULAIR) 10 MG tablet Take 10 mg by mouth in the morning.     mupirocin ointment (BACTROBAN) 2 % Apply 1 application topically daily. Apply to any open areas of skin and cover with band aide until healed. (Patient not taking: Reported on 04/13/2022) 22 g 0   oxyCODONE (OXY IR/ROXICODONE) 5 MG immediate release tablet Take 1-2 tablets (5-10 mg total) by mouth every 6 (six) hours as needed for moderate  pain. 10 tablet 0   pantoprazole (PROTONIX) 40 MG tablet Take 40 mg by mouth daily.     promethazine (PHENERGAN) 25 MG suppository Place 1 suppository (25 mg total) rectally every 6 (six) hours as needed for nausea. 5 suppository 1   rosuvastatin (CRESTOR) 5 MG tablet Take 5 mg by mouth every Saturday.     thyroid (ARMOUR) 30 MG tablet Take 90 mg by mouth daily before breakfast.      venlafaxine XR (EFFEXOR-XR) 37.5 MG 24 hr capsule TAKE 1 CAPSULE BY MOUTH DAILY WITH BREAKFAST. 90 capsule 4   No current facility-administered medications for this visit.    PHYSICAL EXAMINATION: ECOG PERFORMANCE STATUS: {CHL ONC ECOG PS:[phone]}  There were no vitals filed for this visit. There were no vitals filed for this visit.  BREAST:*** No palpable masses or nodules in either right or left breasts. No palpable axillary supraclavicular or infraclavicular adenopathy no breast tenderness or nipple discharge. (exam performed in the presence of a chaperone)  LABORATORY DATA:  I have reviewed the data as listed    Latest Ref Rng & Units 04/20/2022   11:11 AM 04/04/2022   12:31 PM 01/08/2019    2:08 PM  CMP  Glucose 70 - 99 mg/dL 94  140    BUN 8 - 23 mg/dL 19  20    Creatinine 0.44 - 1.00 mg/dL 0.89  1.05  1.10   Sodium 135 - 145 mmol/L 139  130    Potassium 3.5 - 5.1 mmol/L 4.5  4.3    Chloride 98 - 111 mmol/L 105  98    CO2 22 - 32 mmol/L 29  28    Calcium 8.9 - 10.3 mg/dL 9.7  9.2    Total Protein 6.5 - 8.1 g/dL  6.2    Total Bilirubin 0.3 - 1.2 mg/dL  0.5    Alkaline Phos 38 - 126 U/L  74    AST 15 - 41 U/L  24    ALT 0 - 44 U/L  18      Lab Results  Component Value Date   WBC 6.1 04/04/2022   HGB 12.0 04/04/2022   HCT 34.7 (L) 04/04/2022   MCV 90.1 04/04/2022   PLT 275 04/04/2022   NEUTROABS 4.1 04/04/2022    ASSESSMENT & PLAN:  No problem-specific Assessment & Plan notes found for this encounter.    No orders of the defined types were placed in this encounter.  The patient has a good understanding of the overall plan. she agrees with it. she will call with any problems that may develop before the next visit here. Total time spent: 30 mins including face to face time and time spent for planning, charting and co-ordination of care   Suzzette Righter, [place] 05/01/22    I Gardiner Coins am scribing for Dr. Lindi Adie  ***

## 2022-05-14 ENCOUNTER — Encounter: Payer: Self-pay | Admitting: Physical Therapy

## 2022-05-14 ENCOUNTER — Ambulatory Visit: Payer: Medicare Other | Attending: General Surgery | Admitting: Physical Therapy

## 2022-05-14 ENCOUNTER — Other Ambulatory Visit: Payer: Self-pay

## 2022-05-14 DIAGNOSIS — Z170 Estrogen receptor positive status [ER+]: Secondary | ICD-10-CM | POA: Diagnosis present

## 2022-05-14 DIAGNOSIS — C50411 Malignant neoplasm of upper-outer quadrant of right female breast: Secondary | ICD-10-CM | POA: Insufficient documentation

## 2022-05-14 DIAGNOSIS — R293 Abnormal posture: Secondary | ICD-10-CM | POA: Insufficient documentation

## 2022-05-14 DIAGNOSIS — Z483 Aftercare following surgery for neoplasm: Secondary | ICD-10-CM | POA: Insufficient documentation

## 2022-05-15 ENCOUNTER — Inpatient Hospital Stay: Payer: Medicare Other | Attending: Hematology and Oncology | Admitting: Hematology and Oncology

## 2022-05-15 DIAGNOSIS — C50411 Malignant neoplasm of upper-outer quadrant of right female breast: Secondary | ICD-10-CM | POA: Diagnosis not present

## 2022-05-15 DIAGNOSIS — Z9011 Acquired absence of right breast and nipple: Secondary | ICD-10-CM | POA: Insufficient documentation

## 2022-05-15 DIAGNOSIS — Z170 Estrogen receptor positive status [ER+]: Secondary | ICD-10-CM | POA: Diagnosis not present

## 2022-05-15 DIAGNOSIS — Z79811 Long term (current) use of aromatase inhibitors: Secondary | ICD-10-CM | POA: Diagnosis not present

## 2022-05-15 DIAGNOSIS — Z79899 Other long term (current) drug therapy: Secondary | ICD-10-CM | POA: Diagnosis not present

## 2022-05-15 MED ORDER — LETROZOLE 2.5 MG PO TABS: 2.5000 mg | ORAL_TABLET | Freq: Every day | ORAL | 3 refills | Status: DC

## 2022-05-24 ENCOUNTER — Encounter: Payer: Self-pay | Admitting: *Deleted

## 2022-05-24 DIAGNOSIS — C50411 Malignant neoplasm of upper-outer quadrant of right female breast: Secondary | ICD-10-CM

## 2022-06-14 ENCOUNTER — Other Ambulatory Visit: Payer: Self-pay | Admitting: *Deleted

## 2022-06-14 MED ORDER — LETROZOLE 2.5 MG PO TABS: 2.5000 mg | ORAL_TABLET | Freq: Every day | ORAL | 3 refills | Status: DC

## 2022-06-20 ENCOUNTER — Other Ambulatory Visit: Payer: Self-pay | Admitting: Internal Medicine

## 2022-06-20 ENCOUNTER — Ambulatory Visit
Admission: RE | Admit: 2022-06-20 | Discharge: 2022-06-20 | Disposition: A | Discharge status: Home / Self Care | Payer: Medicare Other | Source: Ambulatory Visit | Attending: Internal Medicine | Admitting: Internal Medicine

## 2022-06-20 DIAGNOSIS — R1084 Generalized abdominal pain: Secondary | ICD-10-CM | POA: Diagnosis not present

## 2022-06-29 ENCOUNTER — Ambulatory Visit: Payer: Self-pay | Admitting: Physician Assistant

## 2022-07-01 IMAGING — MG MM DIGITAL SCREENING BILAT W/ TOMO AND CAD
8 series · 8 of 24 positions shown · non-contrast
Comparison: Previous exam(s).

CLINICAL DATA: Screening.

EXAM:
DIGITAL SCREENING BILATERAL MAMMOGRAM WITH TOMOSYNTHESIS AND CAD
TECHNIQUE: Bilateral screening digital craniocaudal and mediolateral oblique
mammograms were obtained. Bilateral screening digital breast
tomosynthesis was performed. The images were evaluated with
computer-aided detection.

[R MLO synth-2D]
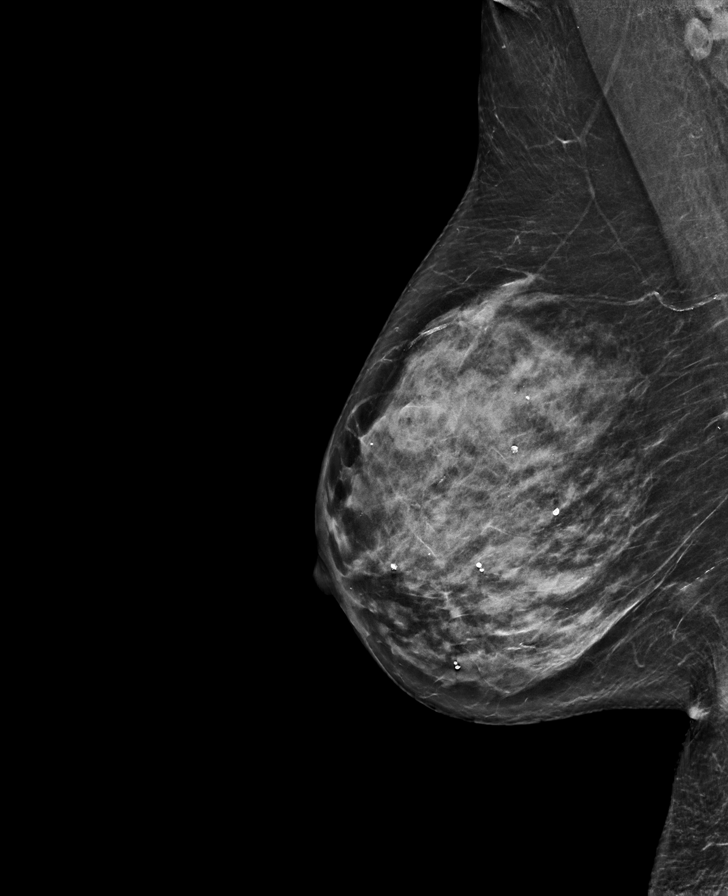

[L MLO synth-2D]
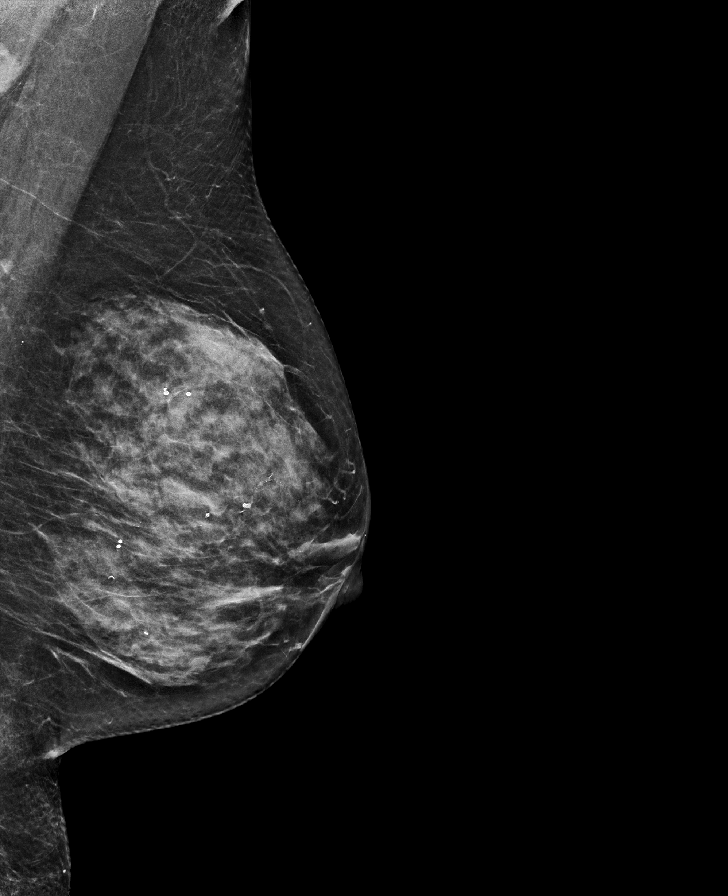

[R CC synth-2D]
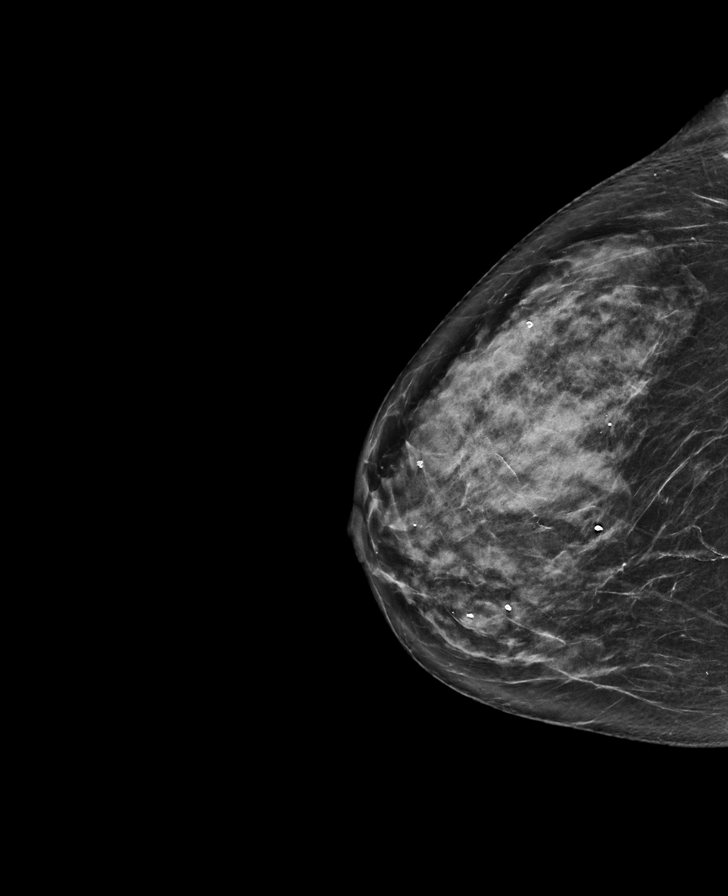

[L CC synth-2D]
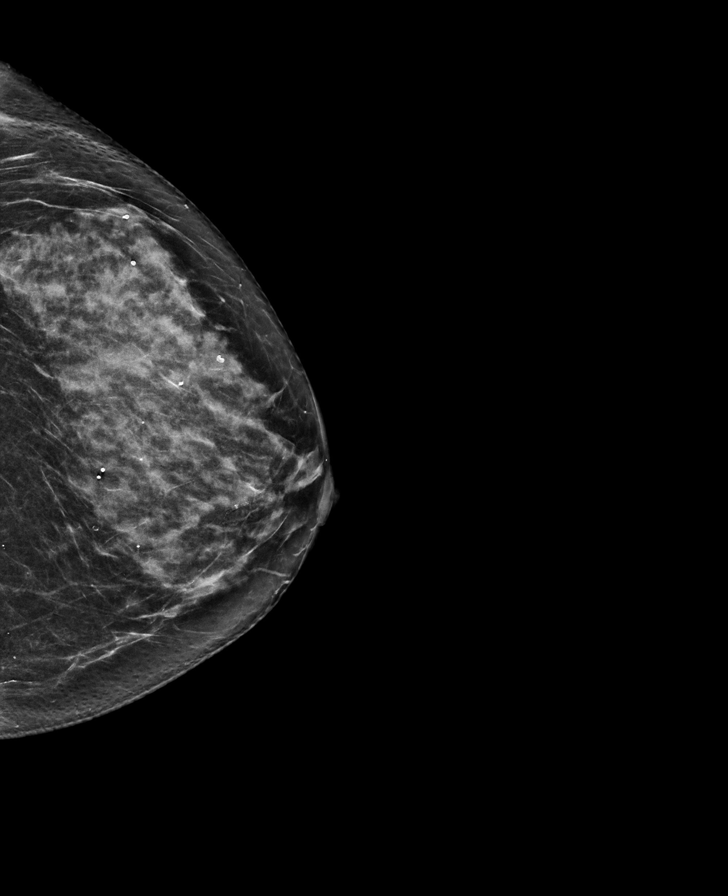

[R MLO tomo · tomo slice 34/67.0]
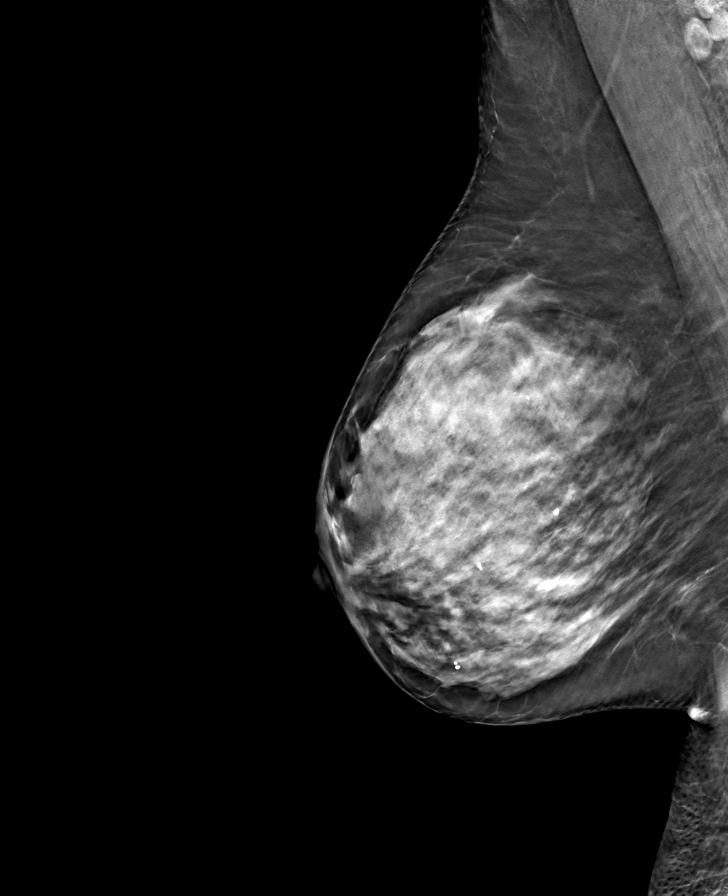

[L MLO tomo · tomo slice 34/67.0]
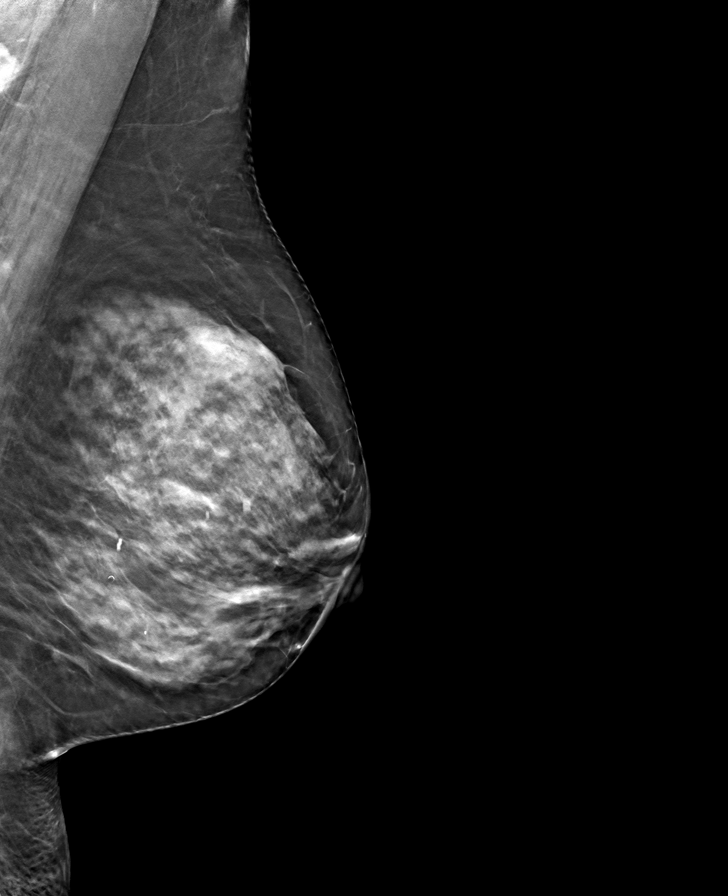

[L CC tomo · tomo slice 33/66.0]
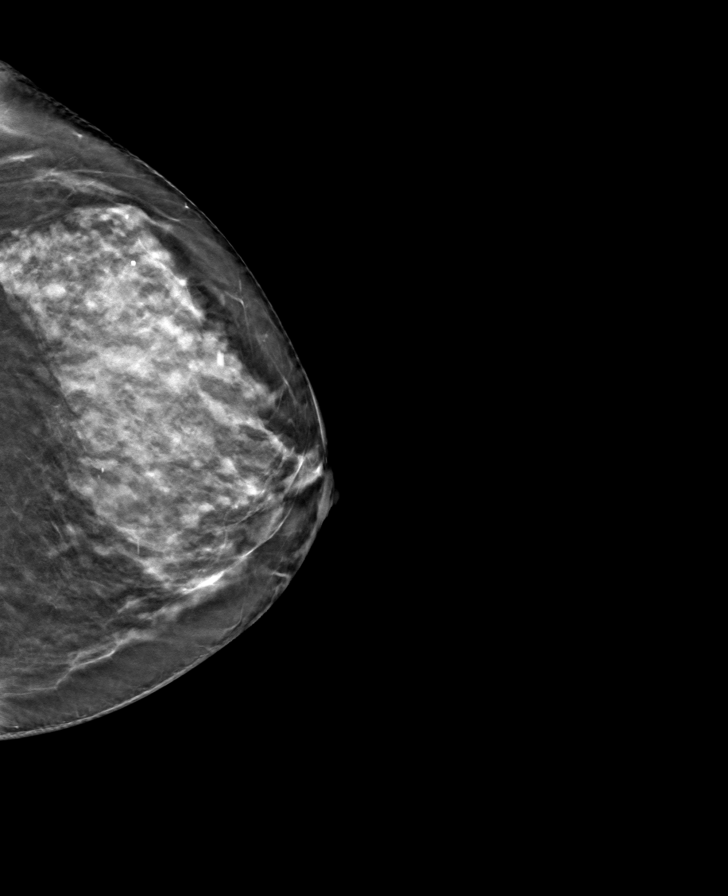

[R CC tomo · tomo slice 33/66.0]
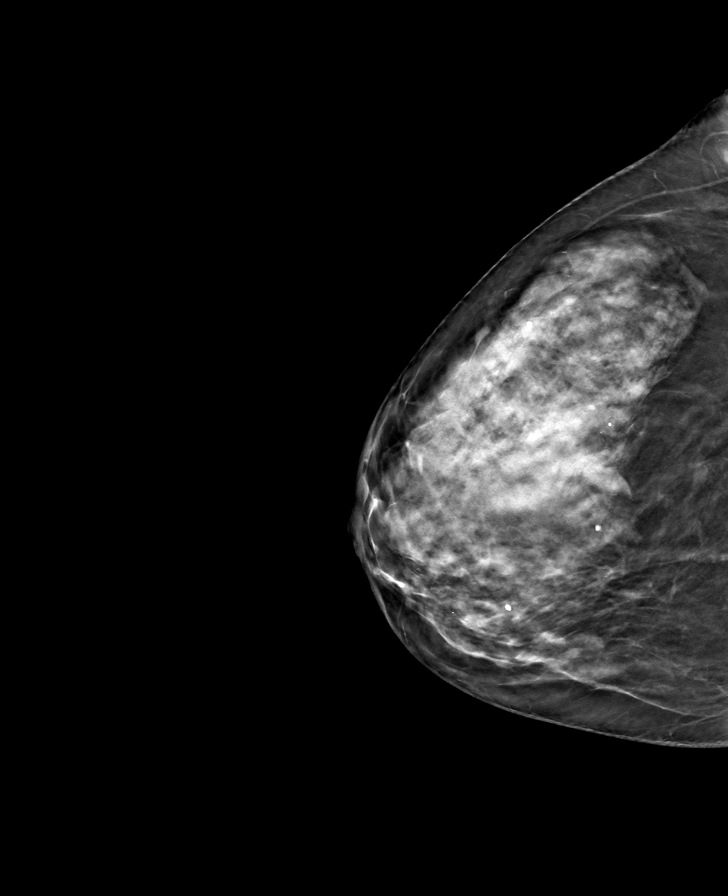

[8 of 24 positions shown; findings below may reference images not displayed]

ACR Breast Density Category d: The breast tissue is extremely dense,
which lowers the sensitivity of mammography.
FINDINGS: In the right breast, possible distortion warrants further
evaluation. In the left breast, no findings suspicious for
malignancy.
IMPRESSION: Further evaluation is suggested for possible distortion in the right
breast.

RECOMMENDATION:
Diagnostic mammogram and possibly ultrasound of the right breast.
(Code:7Q-6-AA1)

The patient will be contacted regarding the findings, and additional
imaging will be scheduled.

BI-RADS CATEGORY  0: Incomplete. Need additional imaging evaluation
and/or prior mammograms for comparison.

## 2022-07-04 ENCOUNTER — Encounter: Payer: Self-pay | Admitting: Urology

## 2022-07-04 ENCOUNTER — Ambulatory Visit: Payer: Medicare Other | Admitting: Urology

## 2022-07-04 VITALS — BP 165/76 | HR 66 | Ht 65.0 in | Wt 169.0 lb

## 2022-07-04 DIAGNOSIS — B3731 Acute candidiasis of vulva and vagina: Secondary | ICD-10-CM | POA: Diagnosis not present

## 2022-07-04 DIAGNOSIS — N3000 Acute cystitis without hematuria: Secondary | ICD-10-CM | POA: Diagnosis not present

## 2022-07-04 DIAGNOSIS — R310 Gross hematuria: Secondary | ICD-10-CM

## 2022-07-04 MED ORDER — FLUCONAZOLE 150 MG PO TABS: 150.0000 mg | ORAL_TABLET | Freq: Once | ORAL | 1 refills | Status: AC

## 2022-07-04 MED ORDER — SULFAMETHOXAZOLE-TRIMETHOPRIM 800-160 MG PO TABS: 1.0000 | ORAL_TABLET | Freq: Two times a day (BID) | ORAL | 0 refills | Status: DC

## 2022-07-04 NOTE — Progress Notes (Cosign Needed)
07/04/22  Katelyn Ball 10-17-1946 [id number]  Referring provider:  Adin Hector, MD [facility] [place],  [place] [address] Chief Complaint  Patient presents with   Hematuria       HPI: Katelyn Ball is a 76 y.o.female Who presents today for further evaluation of hematuria.   She has a personal history of malignant neoplasm of the breast.  She recently underwent surgery and was placed on a chemo pill.  She has had to stop her estrogen patch.  In June, she had an acute UTI with dysuria and hematuria.  She grew E. coli.  She was treated for this but only 5 days of Macrobid.  She had a subsequent episode at which time her urinalysis was fairly bland.  She is growing beta-hemolytic strep.  Last week, she started having dysuria again along with gross hematuria, blood at the urethra upon wiping.  She been taking Pyridium for the last week.  Urinalysis and urine culture was not checked over the past week.  Today, her urinalysis shows greater than 30 white blood cells, 3-10 white blood cells, moderate bacteria and nitrate positive.  She is taking Azo.  Prior to this, she has infrequent UTIs.  She has a lot of drug intolerances, primarily nausea.  She is taking a daily probiotic.  She also mentions that she has a yeast infection.  She has been using Monistat unsuccessfully.  She did have a noncontrast CT scan on 06/20/2022 in light of her multiple infections.  She was noted to have equivocal bladder wall thickening related to cystitis otherwise no upper tract pathology.   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   left upper cutaneous lip   Vitamin D deficiency     Surgical History: Past Surgical History:  Procedure Laterality Date   ABDOMINAL HYSTERECTOMY     BACK SURGERY     BREAST BIOPSY Right 03/26/2022   u/s bx rt 12:00 5cmfn venus clip path pending   BREAST CYST ASPIRATION Left    BREAST CYST ASPIRATION Right 09/25/2017   2 FNA's done on cysts   CHOLECYSTECTOMY     COLONOSCOPY WITH PROPOFOL N/A 10/05/2015   Procedure: COLONOSCOPY WITH PROPOFOL;  Surgeon: Manya Silvas, MD;  Location: [facility] ENDOSCOPY;  Service: Endoscopy;  Laterality: N/A;   ESOPHAGOGASTRODUODENOSCOPY (EGD) WITH PROPOFOL N/A 10/05/2015   Procedure: ESOPHAGOGASTRODUODENOSCOPY (EGD) WITH PROPOFOL;  Surgeon: Manya Silvas, MD;  Location: [facility] ENDOSCOPY;  Service: Endoscopy;  Laterality: N/A;   JOINT REPLACEMENT Bilateral 11/2012   LAMINECTOMY  AND MICRODISCECTOMY LUMBAR SPINE     MASTECTOMY W/ SENTINEL NODE BIOPSY Right 04/23/2022   Procedure: RIGHT MASTECTOMY;  Surgeon: Jovita Kussmaul, MD;  Location: [place];  Service: General;  Laterality: Right;   PILONIDAL CYST DRAINAGE     SENTINEL NODE BIOPSY Right 04/23/2022   Procedure: RIGHT SENTINEL NODE BIOPSY;  Surgeon: Jovita Kussmaul, MD;  Location: [place];  Service: General;  Laterality: Right;   TONSILLECTOMY      Home Medications:  Allergies as of 07/04/2022       Reactions   Ciprofloxacin    Unknown    Cyclobenzaprine Nausea Only   Cymbalta [duloxetine Hcl] Nausea Only   Demerol [meperidine] Nausea Only    Erythromycin    Jaundice   Fiorinal [butalbital-aspirin-caffeine]    Fish Oil Nausea Only   Motrin [ibuprofen] Nausea Only   Naproxen Nausea Only   Nitrofurantoin Nausea Only        Medication List        Accurate as of July 04, 2022 11:59 PM. If you have any questions, ask your nurse or doctor.          STOP taking these medications    buPROPion 150 MG 24 hr tablet Commonly known as: WELLBUTRIN XL Stopped by: Hollice Espy, MD   cephALEXin 500 MG capsule Commonly known as: KEFLEX Stopped by: Hollice Espy, MD   hydrocortisone 2.5 % cream Stopped by: Hollice Espy, MD   promethazine 25 MG suppository Commonly known as: PHENERGAN Stopped by: Hollice Espy, MD   venlafaxine XR 37.5 MG 24 hr capsule Commonly known as: EFFEXOR-XR Stopped by: Hollice Espy, MD       TAKE these medications    acidophilus Caps capsule Take 1 capsule by mouth daily.   BLACK CURRANT SEED OIL PO Take 1 capsule by mouth in the morning and at bedtime.   Cholecalciferol 125 MCG (5000 UT) Tabs Take 5,000 Units by mouth daily.   clonazePAM 1 MG tablet Commonly known as: KLONOPIN Take 1 mg by mouth at bedtime.   Cyanocobalamin 5000 MCG Subl Place 5,000 mcg under the tongue daily.   fenofibrate 160 MG tablet Take 160 mg by mouth at bedtime.   fluconazole 150 MG tablet Commonly known as: DIFLUCAN Take 1 tablet (150 mg total) by mouth once for 1 dose. Started by: Hollice Espy, MD   gabapentin 100 MG capsule Commonly known as: NEURONTIN Take 100 mg by mouth at bedtime.   gabapentin 400 MG capsule Commonly known as: NEURONTIN Take 400 mg by mouth at bedtime.   letrozole 2.5 MG tablet Commonly known as: FEMARA Take 1 tablet (2.5 mg total) by mouth daily.   metoprolol succinate 25 MG 24 hr tablet Commonly known as: TOPROL-XL Take 37.5 mg by mouth daily.   montelukast 10 MG tablet Commonly known as: SINGULAIR Take 10 mg by mouth in the morning.   mupirocin  ointment 2 % Commonly known as: BACTROBAN Apply 1 application topically daily. Apply to any open areas of skin and cover with band aide until healed.   nitrofurantoin (macrocrystal-monohydrate) 100 MG capsule Commonly known as: MACROBID 100 mg.   oxyCODONE 5 MG immediate release tablet Commonly known as: Oxy IR/ROXICODONE Take 1-2 tablets (5-10 mg total) by mouth every 6 (six) hours as needed for moderate pain.   pantoprazole 40 MG tablet Commonly known as: PROTONIX Take 40 mg by mouth daily.   rosuvastatin 5 MG tablet Commonly known as: CRESTOR Take 5 mg by mouth every Saturday.  sulfamethoxazole-trimethoprim 800-160 MG tablet Commonly known as: BACTRIM DS Take 1 tablet by mouth every 12 (twelve) hours. Started by: Hollice Espy, MD   thyroid 30 MG tablet Commonly known as: ARMOUR Take 90 mg by mouth daily before breakfast.        Allergies:  Allergies  Allergen Reactions   Ciprofloxacin     Unknown    Cyclobenzaprine Nausea Only   Cymbalta [Duloxetine Hcl] Nausea Only   Demerol [Meperidine] Nausea Only   Erythromycin     Jaundice   Fiorinal [Butalbital-Aspirin-Caffeine]    Fish Oil Nausea Only   Motrin [Ibuprofen] Nausea Only   Naproxen Nausea Only   Nitrofurantoin Nausea Only    Family History: Family History  Problem Relation Age of Onset   Rectal cancer Mother 13   Prostate cancer Father        dx. 7s, metastatic   Lymphoma Maternal Uncle    Breast cancer Neg Hx     Social History:  reports that she quit smoking about 13 years ago. Her smoking use included cigarettes. She has a 20.00 pack-year smoking history. She has never used smokeless tobacco. She reports that she does not drink alcohol and does not use drugs.   Physical Exam: BP (!) 165/76   Pulse 66   Ht '5\' 5"'$  (1.651 m)   Wt 169 lb (76.7 kg)   BMI 28.12 kg/m   Constitutional:  Alert and oriented, No acute distress. HEENT: [place] AT, moist mucus membranes.  Trachea midline, no  masses. Cardiovascular: No clubbing, cyanosis, or edema. Respiratory: Normal respiratory effort, no increased work of breathing. Skin: No rashes, bruises or suspicious lesions. Neurologic: Grossly intact, no focal deficits, moving all 4 extremities. Psychiatric: Normal mood and affect.  Laboratory Data:  Lab Results  Component Value Date   CREATININE 0.89 04/20/2022    Urinalysis See above  Pertinent Imaging: Above CT scan was personally reviewed.  Agree with radiologic interpretation.   Assessment & Plan:    1.  Recurrent UTI- High risk for this given new diagnosis of breast cancer on letrozole  We have discussed strategies for UTI prevention including increasing hydration, cranberry tablets daily, daily probiotic and d-mannose.  Unfortunately, at this point in time topical estrogen is ill-advised given her new diagnosis of breast cancer.  Could also use vaginal lubricant.  We will see her as needed if she has any recurrent symptoms of infection after this acute episode is treated.  2.  Acute cystitis -UA today is frankly positive, will treat with Bactrim DS for 7 days.  Advised to take with food.  Offered Zofran however she reports that this is not been effective for preventing nausea from antibiotics in the past -= Follow-up urine culture  3.  Vaginal candidiasis -Continue Monistat for comfort currently, the end of the course of antibiotics, Diflucan 150 mg x 1, repeat as needed  4.  Gross hematuria. - Associated with infections only, never in the absence which is reassuring; upper tract imaging also negative.    Conley Rolls as a Education administrator for Hollice Espy, MD.,have documented all relevant documentation on the behalf of Hollice Espy, MD,as directed by  Hollice Espy, MD while in the presence of Hollice Espy, MD.  I have reviewed the above documentation for accuracy and completeness, and I agree with the above.   Hollice Espy, MD   [facility] [address] [place],  [address] [phone]

## 2022-07-04 NOTE — Patient Instructions (Signed)
Recommend taking daily probiotic, cranberry tablets, and d-mannose daily for UTI prevention.

## 2022-07-05 LAB — MICROSCOPIC EXAMINATION: WBC, UA: >30 /hpf — AB (ref 0–5)

## 2022-07-05 LAB — URINALYSIS, COMPLETE
Bilirubin, UA: NEGATIVE
Glucose, UA: NEGATIVE
Ketones, UA: NEGATIVE
Nitrite, UA: POSITIVE — AB
Protein,UA: NEGATIVE
Specific Gravity, UA: 1.015 (ref 1.005–1.030)
Urobilinogen, Ur: 1 mg/dL (ref 0.2–1.0)
pH, UA: 6 (ref 5.0–7.5)

## 2022-07-08 LAB — CULTURE, URINE COMPREHENSIVE

## 2022-08-13 ENCOUNTER — Ambulatory Visit: Payer: Medicare Other | Attending: General Surgery

## 2022-08-13 VITALS — Wt 174.1 lb

## 2022-08-13 DIAGNOSIS — Z483 Aftercare following surgery for neoplasm: Secondary | ICD-10-CM | POA: Insufficient documentation

## 2022-08-13 NOTE — Therapy (Signed)
OUTPATIENT PHYSICAL THERAPY SOZO SCREENING NOTE   Patient Name: Katelyn Ball MRN: [id number] DOB:1946/04/11, 76 y.o., female Today's Date: 08/13/2022  PCP: Adin Hector, MD REFERRING PROVIDER: Jovita Kussmaul, MD   PT End of Session - 08/13/22 1541     Visit Number 1   # unchanged due to screen only   PT Start Time 1538    PT Stop Time 109    PT Time Calculation (min) 18 min    Activity Tolerance Patient tolerated treatment well    Behavior During Therapy WFL for tasks assessed/performed             Past Medical History:  Diagnosis Date   Actinic keratosis    Anemia    Anxiety    Asthma    Atelectasis    B12 deficiency    Cancer ([place])    skin   COPD (chronic obstructive pulmonary disease) ([place])    DDD (degenerative disc disease), cervical    Dysrhythmia    GERD (gastroesophageal reflux disease)    History of chicken pox    Hyperlipidemia    Hypertension    Hypothyroidism    Iritis    Meniere disease    PONV (postoperative nausea and vomiting)    Pre-diabetes    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   right upper arm, ATYPICAL SQUAMOUS PROLIFERATION, 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, re-biopsied and treated with [facility] 11/15/21   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   left posterior calf inferior to popliteal fossa   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   left upper cutaneous lip   Vitamin D deficiency    Past Surgical History:  Procedure Laterality Date   ABDOMINAL HYSTERECTOMY     BACK SURGERY     BREAST BIOPSY Right 03/26/2022   u/s bx rt 12:00 5cmfn venus clip path pending   BREAST CYST ASPIRATION Left    BREAST CYST ASPIRATION Right 09/25/2017   2 FNA's done on cysts   CHOLECYSTECTOMY     COLONOSCOPY WITH PROPOFOL N/A 10/05/2015   Procedure: COLONOSCOPY WITH PROPOFOL;  Surgeon: Manya Silvas, MD;  Location: [facility] ENDOSCOPY;  Service: Endoscopy;  Laterality: N/A;    ESOPHAGOGASTRODUODENOSCOPY (EGD) WITH PROPOFOL N/A 10/05/2015   Procedure: ESOPHAGOGASTRODUODENOSCOPY (EGD) WITH PROPOFOL;  Surgeon: Manya Silvas, MD;  Location: [facility] ENDOSCOPY;  Service: Endoscopy;  Laterality: N/A;   JOINT REPLACEMENT Bilateral 11/2012   LAMINECTOMY AND MICRODISCECTOMY LUMBAR SPINE     MASTECTOMY W/ SENTINEL NODE BIOPSY Right 04/23/2022   Procedure: RIGHT MASTECTOMY;  Surgeon: Jovita Kussmaul, MD;  Location: [place];  Service: General;  Laterality: Right;   PILONIDAL CYST DRAINAGE     SENTINEL NODE BIOPSY Right 04/23/2022   Procedure: RIGHT SENTINEL NODE BIOPSY;  Surgeon: Jovita Kussmaul, MD;  Location: [place];  Service: General;  Laterality: Right;   TONSILLECTOMY     Patient Active Problem List   Diagnosis Date Noted   Cancer of right female breast ([place]) 04/23/2022   Genetic testing 04/20/2022   Malignant neoplasm of upper-outer quadrant of right breast in female, estrogen receptor positive ([place]) 04/04/2022   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    REFERRING DIAG: right breast cancer at risk for lymphedema  THERAPY DIAG: Aftercare following surgery for neoplasm  PERTINENT HISTORY: Patient was diagnosed on 02/19/2022 with 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 She underwent a right mastectomy and sentinel node biopsy (5 negative nodes  removed) on 04/23/2022. It is ER/PR positive and HER2 negative with an unknown Ki67.   PRECAUTIONS: right UE Lymphedema risk, None  SUBJECTIVE: Pt returns for her first 3 month L-Dex screen.   PAIN:  Are you having pain? No  SOZO SCREENING: Patient was assessed today using the SOZO machine to determine the lymphedema index score. This was compared to her baseline score. It was determined that she is NOT within the recommended range when compared to her baseline and so she was fitted for a compression garment while in the clinic today. It is recommended she return in 1 month to be reassessed. If she continues to  measure outside the recommended range, physical therapy treatment will be recommended at that time and a referral requested. Cheral Almas, PT was available to measure pt for her compression sleeve and answer her questions. Handout with instructions with how to wear sleeve was also issued to her.    L-DEX FLOWSHEETS - 08/13/22 1500       L-DEX LYMPHEDEMA SCREENING   Measurement Type Unilateral    L-DEX MEASUREMENT EXTREMITY Upper Extremity    POSITION  Standing    DOMINANT SIDE Right    At Risk Side Right    BASELINE SCORE (UNILATERAL) -0.3    L-DEX SCORE (UNILATERAL) 7.4    VALUE CHANGE (UNILAT) 7.7             PLEASE KEEP YOUR COMPRESSION GARMENT ON DURING THE DAY TO GET THE BEST SWELLING REDUCTION. HERE ARE SOME ADDITIONAL TIPS: Do not sleep in your garment. If you have pain or notice swelling in your hand or at the top of your shoulder, call your therapist. This may be a sign that you need a different garment. 3.  Take good care of your garment so it lasts longer: Follow washing instructions on your garment label or box. Wash periodically using a mild detergent in warm water.  Do not use fabric softener or bleach.  Place garment in a mesh lingerie bag and use the gentle cycle of the washing machine or hand wash. Tumble dry low or lay flat to dry. TAKE CARE OF YOUR SKIN Apply a low pH moisturizing lotion to your skin daily Avoid scratching your skin Treat skin irritations quickly  Know the 5 warning signs of infection: redness, pain, warmth to touch, fever and increased swelling.  Call your physician immediately if you notice any of these signs of a possible infection.    Otelia Limes, PTA 08/13/2022, 5:22 PM

## 2022-08-13 NOTE — Patient Instructions (Signed)
PLEASE KEEP YOUR COMPRESSION GARMENT ON DURING THE DAY TO GET THE BEST SWELLING REDUCTION. HERE ARE SOME ADDITIONAL TIPS: Do not sleep in your garment. If you have pain or notice swelling in your hand or at the top of your shoulder, call your therapist. This may be a sign that you need a different garment. 3.  Take good care of your garment so it lasts longer: Follow washing instructions on your garment label or box. Wash periodically using a mild detergent in warm water.  Do not use fabric softener or bleach.  Place garment in a mesh lingerie bag and use the gentle cycle of the washing machine or hand wash. Tumble dry low or lay flat to dry. TAKE CARE OF YOUR SKIN Apply a low pH moisturizing lotion to your skin daily Avoid scratching your skin Treat skin irritations quickly  Know the 5 warning signs of infection: redness, pain, warmth to touch, fever and increased swelling.  Call your physician immediately if you notice any of these signs of a possible infection.  

## 2022-08-16 ENCOUNTER — Inpatient Hospital Stay: Payer: Medicare Other | Attending: Adult Health | Admitting: Adult Health

## 2022-08-16 ENCOUNTER — Encounter: Payer: Self-pay | Admitting: Adult Health

## 2022-08-16 ENCOUNTER — Other Ambulatory Visit: Payer: Self-pay

## 2022-08-16 VITALS — BP 187/71 | HR 61 | Temp 97.5°F | Resp 16 | Ht 65.0 in | Wt 173.9 lb

## 2022-08-16 DIAGNOSIS — Z9011 Acquired absence of right breast and nipple: Secondary | ICD-10-CM | POA: Diagnosis not present

## 2022-08-16 DIAGNOSIS — Z79811 Long term (current) use of aromatase inhibitors: Secondary | ICD-10-CM | POA: Diagnosis not present

## 2022-08-16 DIAGNOSIS — C50411 Malignant neoplasm of upper-outer quadrant of right female breast: Secondary | ICD-10-CM | POA: Insufficient documentation

## 2022-08-16 DIAGNOSIS — Z170 Estrogen receptor positive status [ER+]: Secondary | ICD-10-CM | POA: Insufficient documentation

## 2022-08-16 NOTE — Progress Notes (Signed)
SURVIVORSHIP VISIT:  BRIEF ONCOLOGIC HISTORY:  Oncology History  Malignant neoplasm of upper-outer quadrant of right breast in female, estrogen receptor positive ([place])  03/26/2022 Initial Diagnosis   Screening detected right breast distortion at 12:00: 1.1 cm, axilla negative, ultrasound biopsy: Grade 1 ILC, ER greater than 90%, PR greater than 90%, HER2 negative, Ki-67 not done   04/04/2022 Cancer Staging   Staging form: Breast, AJCC 8th Edition - Clinical: Stage IA (cT1c, cN0, cM0, G1, ER+, PR+, HER2-) - Signed by Nicholas Lose, MD on 04/04/2022 Stage prefix: Initial diagnosis Histologic grading system: 3 grade system    Genetic Testing   [facility] CustomNext Panel was Negative. Report date is 04/19/2022.  The CustomNext-Cancer+RNAinsight panel offered by [facility] includes sequencing and rearrangement analysis for the following 47 genes:  APC, ATM, AXIN2, BARD1, BMPR1A, BRCA1, BRCA2, BRIP1, CDH1, CDK4, CDKN2A, CHEK2, CTNNA1, DICER1, EPCAM, GREM1, HOXB13, KIT, MEN1, MLH1, MSH2, MSH3, MSH6, MUTYH, NBN, NF1, NTHL1, PALB2, PDGFRA, PMS2, POLD1, POLE, PTEN, RAD50, RAD51C, RAD51D, SDHA, SDHB, SDHC, SDHD, SMAD4, SMARCA4, STK11, TP53, TSC1, TSC2, and VHL.  RNA data is routinely analyzed for use in variant interpretation for all genes.   04/23/2022 Surgery   Right Mastectomy: Grade 2 ILC 2.1 cm, LG DCIS, ALH, margins Neg, Benign IM LN, 0/5 LN Neg, ER > 90%, PR > 90%, HER2 negative, Ki-67 not done   06/02/2022 -  Anti-estrogen oral therapy   Letrozole 2.5 mg daily x7 years      INTERVAL HISTORY:  Katelyn Ball to review her survivorship care plan detailing her treatment course for breast cancer, as well as monitoring long-term side effects of that treatment, education regarding health maintenance, screening, and overall wellness and health promotion.     Overall, Katelyn Ball reports feeling quite well.  She is tolerating antiestrogen therapy with letrozole moderately well.  She is experiencing some hot  flashes and has tried Effexor and gabapentin without much success.  Of note prior to her diagnosis she was on estrogen patch for many years because of her difficulty with hot flashes.  REVIEW OF SYSTEMS:  Review of Systems  Constitutional:  Negative for appetite change, chills, fatigue, fever and unexpected weight change.  HENT:   Negative for hearing loss, lump/mass and trouble swallowing.   Eyes:  Negative for eye problems and icterus.  Respiratory:  Negative for chest tightness, cough and shortness of breath.   Cardiovascular:  Negative for chest pain, leg swelling and palpitations.  Gastrointestinal:  Negative for abdominal distention, abdominal pain, constipation, diarrhea, nausea and vomiting.  Endocrine: Positive for hot flashes.  Genitourinary:  Negative for difficulty urinating.   Musculoskeletal:  Negative for arthralgias.  Skin:  Negative for itching and rash.  Neurological:  Negative for dizziness, extremity weakness, headaches and numbness.  Hematological:  Negative for adenopathy. Does not bruise/bleed easily.  Psychiatric/Behavioral:  Negative for depression. The patient is not nervous/anxious.   Breast: Denies any new nodularity, masses, tenderness, nipple changes, or nipple discharge.    ONCOLOGY TREATMENT TEAM:  1. Surgeon:  Dr. Marlou Starks at [facility] 2. Medical Oncologist: Dr. Lindi Adie  3. Radiation Oncologist: Dr. Isidore Moos    PAST MEDICAL/SURGICAL HISTORY:  Past Medical History:  Diagnosis Date   Actinic keratosis    Anemia    Anxiety    Asthma    Atelectasis    B12 deficiency    Cancer ([place])    skin   COPD (chronic obstructive pulmonary disease) (HCC)    DDD (degenerative disc disease), cervical  Dysrhythmia    GERD (gastroesophageal reflux disease)    History of chicken pox    Hyperlipidemia    Hypertension    Hypothyroidism    Iritis    Meniere disease    PONV (postoperative nausea and vomiting)    Pre-diabetes    SCC (squamous cell  carcinoma) 09/21/2021   right upper arm, ATYPICAL SQUAMOUS PROLIFERATION, 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, re-biopsied and treated with [facility] 11/15/21   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   left posterior calf inferior to popliteal fossa   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   left upper cutaneous lip   Vitamin D deficiency    Past Surgical History:  Procedure Laterality Date   ABDOMINAL HYSTERECTOMY     BACK SURGERY     BREAST BIOPSY Right 03/26/2022   u/s bx rt 12:00 5cmfn venus clip path pending   BREAST CYST ASPIRATION Left    BREAST CYST ASPIRATION Right 09/25/2017   2 FNA's done on cysts   CHOLECYSTECTOMY     COLONOSCOPY WITH PROPOFOL N/A 10/05/2015   Procedure: COLONOSCOPY WITH PROPOFOL;  Surgeon: Manya Silvas, MD;  Location[facility] ENDOSCOPY;  Service: Endoscopy;  Laterality: N/A;   ESOPHAGOGASTRODUODENOSCOPY (EGD) WITH PROPOFOL N/A 10/05/2015   Procedure: ESOPHAGOGASTRODUODENOSCOPY (EGD) WITH PROPOFOL;  Surgeon: Manya Silvas, MD;  Location: [facility] ENDOSCOPY;  Service: Endoscopy;  Laterality: N/A;   JOINT REPLACEMENT Bilateral 11/2012   LAMINECTOMY AND MICRODISCECTOMY LUMBAR SPINE     MASTECTOMY W/ SENTINEL NODE BIOPSY Right 04/23/2022   Procedure: RIGHT MASTECTOMY;  Surgeon: Jovita Kussmaul, MD;  Location: [place];  Service: General;  Laterality: Right;   PILONIDAL CYST DRAINAGE     SENTINEL NODE BIOPSY Right 04/23/2022   Procedure: RIGHT SENTINEL NODE BIOPSY;  Surgeon: Jovita Kussmaul, MD;  Location: [place];  Service: General;  Laterality: Right;   TONSILLECTOMY       ALLERGIES:  Allergies  Allergen Reactions   Ciprofloxacin     Unknown    Cyclobenzaprine Nausea Only   Cymbalta [Duloxetine Hcl] Nausea Only   Demerol [Meperidine] Nausea Only   Erythromycin     Jaundice   Fiorinal [Butalbital-Aspirin-Caffeine]    Fish Oil Nausea Only   Motrin [Ibuprofen] Nausea Only   Naproxen Nausea Only   Nitrofurantoin Nausea Only      CURRENT MEDICATIONS:  Outpatient Encounter Medications as of 08/16/2022  Medication Sig   acidophilus (RISAQUAD) CAPS capsule Take 1 capsule by mouth daily.   BLACK CURRANT SEED OIL PO Take 1 capsule by mouth in the morning and at bedtime.   Cholecalciferol 125 MCG (5000 UT) TABS Take 5,000 Units by mouth daily.   clonazePAM (KLONOPIN) 1 MG tablet Take 1 mg by mouth at bedtime.   Cyanocobalamin 5000 MCG SUBL Place 5,000 mcg under the tongue daily.   fenofibrate 160 MG tablet Take 160 mg by mouth at bedtime.   gabapentin (NEURONTIN) 100 MG capsule Take 100 mg by mouth at bedtime.   gabapentin (NEURONTIN) 400 MG capsule Take 400 mg by mouth at bedtime.   letrozole (FEMARA) 2.5 MG tablet Take 1 tablet (2.5 mg total) by mouth daily.   metoprolol succinate (TOPROL-XL) 25 MG 24 hr tablet Take 37.5 mg by mouth daily.   montelukast (SINGULAIR) 10 MG tablet Take 10 mg by mouth in the morning.   mupirocin ointment (BACTROBAN) 2 % Apply 1 application topically daily. Apply to any open areas of skin and cover with  band aide until healed.   nitrofurantoin, macrocrystal-monohydrate, (MACROBID) 100 MG capsule 100 mg.   oxyCODONE (OXY IR/ROXICODONE) 5 MG immediate release tablet Take 1-2 tablets (5-10 mg total) by mouth every 6 (six) hours as needed for moderate pain.   pantoprazole (PROTONIX) 40 MG tablet Take 40 mg by mouth daily.   rosuvastatin (CRESTOR) 5 MG tablet Take 5 mg by mouth every Saturday.   sulfamethoxazole-trimethoprim (BACTRIM DS) 800-160 MG tablet Take 1 tablet by mouth every 12 (twelve) hours.   thyroid (ARMOUR) 30 MG tablet Take 90 mg by mouth daily before breakfast.   No facility-administered encounter medications on file as of 08/16/2022.     ONCOLOGIC FAMILY HISTORY:  Family History  Problem Relation Age of Onset   Rectal cancer Mother 20   Prostate cancer Father        dx. 80s, metastatic   Lymphoma Maternal Uncle    Breast cancer Neg Hx        SOCIAL HISTORY:   Social History   Socioeconomic History   Marital status: Married    Spouse name: Not on file   Number of children: Not on file   Years of education: Not on file   Highest education level: Not on file  Occupational History   Not on file  Tobacco Use   Smoking status: Former    Packs/day: 0.50    Years: 40.00    Total pack years: 20.00    Types: Cigarettes    Quit date: 10/03/2008    Years since quitting: 13.8   Smokeless tobacco: Never  Substance and Sexual Activity   Alcohol use: No   Drug use: No   Sexual activity: Not on file  Other Topics Concern   Not on file  Social History Narrative   Not on file   Social Determinants of Health   Financial Resource Strain: Not on file  Food Insecurity: Not on file  Transportation Needs: Not on file  Physical Activity: Not on file  Stress: Not on file  Social Connections: Not on file  Intimate Partner Violence: Not on file     OBSERVATIONS/OBJECTIVE:  BP (!) 187/71 (BP Location: Left Arm, Patient Position: Sitting) Comment: provider notified  Pulse 61   Temp (!) 97.5 F (36.4 C) (Temporal)   Resp 16   Ht '5\' 5"'  (1.651 m)   Wt 173 lb 14.4 oz (78.9 kg)   SpO2 100%   BMI 28.94 kg/m  GENERAL: Patient is a well appearing female in no acute distress HEENT:  Sclerae anicteric.  Oropharynx clear and moist. No ulcerations or evidence of oropharyngeal candidiasis. Neck is supple.  NODES:  No cervical, supraclavicular, or axillary lymphadenopathy palpated.  BREAST EXAM: Right breast status postmastectomy no sign of local recurrence left breast is benign. LUNGS:  Clear to auscultation bilaterally.  No wheezes or rhonchi. HEART:  Regular rate and rhythm. No murmur appreciated. ABDOMEN:  Soft, nontender.  Positive, normoactive bowel sounds. No organomegaly palpated. MSK:  No focal spinal tenderness to palpation. Full range of motion bilaterally in the upper extremities. EXTREMITIES:  No peripheral edema.   SKIN:  Clear with no  obvious rashes or skin changes. No nail dyscrasia. NEURO:  Nonfocal. Well oriented.  Appropriate affect.   LABORATORY DATA:  None for this visit.  DIAGNOSTIC IMAGING:  None for this visit.      ASSESSMENT AND PLAN:  Ms.. Ball is a pleasant 76 y.o. 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  She presents to the Survivorship Clinic for our initial meeting and routine follow-up post-completion of treatment for breast cancer.    1. Stage 1A right breast cancer:  Katelyn Ball is continuing to recover from definitive treatment for breast cancer. She will follow-up with her medical oncologist, Dr. Lindi Adie in 6 months with history and physical exam per surveillance protocol.  She will continue her anti-estrogen therapy with letrozole.  Thus far, she is tolerating the letrozole well, with minimal side effects. She was instructed to make Dr. Lindi Adie or myself aware if she begins to experience any worsening side effects of the medication and I could see her back in clinic to help manage those side effects, as needed. Her mammogram is due April 2024; orders placed today.  She has breast density category D and in this instance supplemental MRIs would likely be beneficial and follow-up surveillance of her remaining breast.  Today, a comprehensive survivorship care plan and treatment summary was reviewed with the patient today detailing her breast cancer diagnosis, treatment course, potential late/long-term effects of treatment, appropriate follow-up care with recommendations for the future, and patient education resources.  A copy of this summary, along with a letter will be sent to the patient's primary care provider via mail/fax/In Basket message after today's visit.    2.  Hot flashes: I recommended that she undergo acupuncture which she is interested in  trying.  3. Bone health:  Given Katelyn Ball's age/history of breast cancer and her current treatment regimen including anti-estrogen therapy with letrozole, she is at risk for bone demineralization.  She is due for repeat bone density testing and we will try to get this scheduled at [facility] clinic.  She was given education on specific activities to promote bone health.  4. Cancer screening:  Due to Katelyn Ball's history and her age, she should receive screening for skin cancers, colon cancer, lung cancer. The information and recommendations are listed on the patient's comprehensive care plan/treatment summary and were reviewed in detail with the patient.    5. Health maintenance and wellness promotion: Katelyn Ball was encouraged to consume 5-7 servings of fruits and vegetables per day. We reviewed the "Nutrition Rainbow" handout.  She was also encouraged to engage in moderate to vigorous exercise for 30 minutes per day most days of the week. We discussed the [employer] fitness program, which is designed for cancer survivors to help them become more physically fit after cancer treatments.  She was instructed to limit her alcohol consumption and continue to abstain from tobacco use.     6. Support services/counseling: It is not uncommon for this period of the patient's cancer care trajectory to be one of many emotions and stressors.  She was given information regarding our available services and encouraged to contact me with any questions or for help enrolling in any of our support group/programs.    Follow up instructions:    -Return to cancer center in 6 months for follow-up with Dr. Lindi Adie -Mammogram due in 2024 -Follow up with surgery year -She is welcome to return back to the Survivorship Clinic at any time; no additional follow-up needed at this time.  -Consider referral back to survivorship as a long-term survivor for continued surveillance  The patient was provided an opportunity to ask  questions and all were answered. The patient agreed with the plan and demonstrated an understanding of the instructions.   Total  encounter time:50 minutes*in face-to-face visit time, chart review, lab review, care coordination, order entry, and documentation of the encounter time.    Wilber Bihari, NP 08/16/22 12:08 PM Medical Oncology and Hematology [facility] [place], [place] [address] Tel. [phone]    Fax. [phone]  *Total Encounter Time as defined by the Centers for Medicare and Medicaid Services includes, in addition to the face-to-face time of a patient visit (documented in the note above) non-face-to-face time: obtaining and reviewing outside history, ordering and reviewing medications, tests or procedures, care coordination (communications with other health care professionals or caregivers) and documentation in the medical record.

## 2022-08-17 ENCOUNTER — Encounter: Payer: Self-pay | Admitting: Adult Health

## 2022-09-10 ENCOUNTER — Ambulatory Visit: Payer: Medicare Other | Attending: General Surgery

## 2022-09-10 VITALS — Wt 174.5 lb

## 2022-09-10 DIAGNOSIS — Z483 Aftercare following surgery for neoplasm: Secondary | ICD-10-CM | POA: Insufficient documentation

## 2022-09-10 NOTE — Therapy (Signed)
OUTPATIENT PHYSICAL THERAPY SOZO SCREENING NOTE   Patient Name: Katelyn Ball MRN: [id number] DOB:12/24/1945, 76 y.o., female Today's Date: 09/10/2022  PCP: Adin Hector, MD REFERRING PROVIDER: Jovita Kussmaul, MD   PT End of Session - 09/10/22 1559     Visit Number 1   # unchanged due to screen only   PT Start Time 71    PT Stop Time 1602    PT Time Calculation (min) 4 min    Activity Tolerance Patient tolerated treatment well    Behavior During Therapy WFL for tasks assessed/performed             Past Medical History:  Diagnosis Date   Actinic keratosis    Anemia    Anxiety    Asthma    Atelectasis    B12 deficiency    Cancer ([place])    skin   COPD (chronic obstructive pulmonary disease) ([place])    DDD (degenerative disc disease), cervical    Dysrhythmia    GERD (gastroesophageal reflux disease)    History of chicken pox    Hyperlipidemia    Hypertension    Hypothyroidism    Iritis    Meniere disease    PONV (postoperative nausea and vomiting)    Pre-diabetes    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   right upper arm, ATYPICAL SQUAMOUS PROLIFERATION, 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, re-biopsied and treated with [facility] 11/15/21   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   left posterior calf inferior to popliteal fossa   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   left upper cutaneous lip   Vitamin D deficiency    Past Surgical History:  Procedure Laterality Date   ABDOMINAL HYSTERECTOMY     BACK SURGERY     BREAST BIOPSY Right 03/26/2022   u/s bx rt 12:00 5cmfn venus clip path pending   BREAST CYST ASPIRATION Left    BREAST CYST ASPIRATION Right 09/25/2017   2 FNA's done on cysts   CHOLECYSTECTOMY     COLONOSCOPY WITH PROPOFOL N/A 10/05/2015   Procedure: COLONOSCOPY WITH PROPOFOL;  Surgeon: Manya Silvas, MD;  Location[facility] ENDOSCOPY;  Service: Endoscopy;  Laterality: N/A;    ESOPHAGOGASTRODUODENOSCOPY (EGD) WITH PROPOFOL N/A 10/05/2015   Procedure: ESOPHAGOGASTRODUODENOSCOPY (EGD) WITH PROPOFOL;  Surgeon: Manya Silvas, MD;  Location: [facility] ENDOSCOPY;  Service: Endoscopy;  Laterality: N/A;   JOINT REPLACEMENT Bilateral 11/2012   LAMINECTOMY AND MICRODISCECTOMY LUMBAR SPINE     MASTECTOMY W/ SENTINEL NODE BIOPSY Right 04/23/2022   Procedure: RIGHT MASTECTOMY;  Surgeon: Jovita Kussmaul, MD;  Location: [place];  Service: General;  Laterality: Right;   PILONIDAL CYST DRAINAGE     SENTINEL NODE BIOPSY Right 04/23/2022   Procedure: RIGHT SENTINEL NODE BIOPSY;  Surgeon: Jovita Kussmaul, MD;  Location: [place];  Service: General;  Laterality: Right;   TONSILLECTOMY     Patient Active Problem List   Diagnosis Date Noted   Cancer of right female breast ([place]) 04/23/2022   Genetic testing 04/20/2022   Malignant neoplasm of upper-outer quadrant of right breast in female, estrogen receptor positive ([place]) 04/04/2022   Prediabetes 03/27/2022   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   Vitamin D deficiency 05/24/2021   Vaginal enterocele 05/24/2021   Colonic polyp 05/24/2021   GERD (gastroesophageal reflux disease) 05/24/2021   Chronic obstructive pulmonary disease, unspecified ([place]) 05/24/2021   Allergic rhinitis due to allergen 05/24/2021   Coronary artery calcification seen  on CT scan 12/18/2017   Aortic atherosclerosis ([place]) 12/18/2017   Recurrent major depressive disorder, in full remission ([place]) 10/18/2016   Diverticulosis of large intestine without hemorrhage 10/18/2015   Trochanteric bursitis of right hip 01/25/2015   Lumbar radiculitis 01/25/2015   Pure hypercholesterolemia 11/02/2014   Essential hypertension 11/02/2014   B12 deficiency 09/15/2014    REFERRING DIAG: right breast cancer at risk for lymphedema  THERAPY DIAG:  Aftercare following surgery for neoplasm  PERTINENT HISTORY: Patient was diagnosed on 02/19/2022 with 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 She underwent a right mastectomy and sentinel node biopsy (5 negative nodes removed) on 04/23/2022. It is ER/PR positive and HER2 negative with an unknown Ki67.  PRECAUTIONS: right UE Lymphedema risk, None  SUBJECTIVE: Pt returns for her 1 month reassess since having a high change from baseline indicating subclinical lymphedema.   PAIN:  Are you having pain? No  SOZO SCREENING: Patient was assessed today using the SOZO machine to determine the lymphedema index score. This was compared to her baseline score. It was determined that she is within the recommended range when compared to her baseline and no further action is needed at this time. She will continue SOZO screenings. These are done every 3 months for 2 years post operatively followed by every 6 months for 2 years, and then annually.   L-DEX FLOWSHEETS - 09/10/22 1600       L-DEX LYMPHEDEMA SCREENING   Measurement Type Unilateral    L-DEX MEASUREMENT EXTREMITY Upper Extremity    POSITION  Standing    DOMINANT SIDE Right    At Risk Side Right    BASELINE SCORE (UNILATERAL) -0.3    L-DEX SCORE (UNILATERAL) -2.3    VALUE CHANGE (UNILAT) -2              Otelia Limes, PTA 09/10/2022, 4:02 PM

## 2022-09-17 ENCOUNTER — Ambulatory Visit: Payer: Medicare Other | Admitting: Dermatology

## 2022-09-18 ENCOUNTER — Other Ambulatory Visit: Payer: Self-pay | Admitting: *Deleted

## 2022-09-18 DIAGNOSIS — Z170 Estrogen receptor positive status [ER+]: Secondary | ICD-10-CM

## 2022-09-18 NOTE — Progress Notes (Signed)
Per NP request, Bone Density request faxed to [facility] [phone]).  NP also states pt due for Mammogram in November of left breast and Breast MRI in May of 2024 due to hx of breast cancer and dense breast tissue.  Orders placed.

## 2022-09-26 ENCOUNTER — Ambulatory Visit: Payer: Medicare Other | Admitting: Dermatology

## 2022-09-26 DIAGNOSIS — L570 Actinic keratosis: Secondary | ICD-10-CM

## 2022-09-26 DIAGNOSIS — L578 Other skin changes due to chronic exposure to nonionizing radiation: Secondary | ICD-10-CM | POA: Diagnosis not present

## 2022-09-26 DIAGNOSIS — L821 Other seborrheic keratosis: Secondary | ICD-10-CM | POA: Diagnosis not present

## 2022-09-26 DIAGNOSIS — Z872 Personal history of diseases of the skin and subcutaneous tissue: Secondary | ICD-10-CM

## 2022-09-26 NOTE — Progress Notes (Signed)
Follow-Up Visit   Subjective  Katelyn Ball is a 76 y.o. female who presents for the following: Actinic Keratosis (Patient reports history of skin cancer in left arm patient reports a spot has popped up in left elbow area.  Spot at right upper arm. Spot at back husband would like checked. ).   The following portions of the chart were reviewed this encounter and updated as appropriate:   Tobacco  Allergies  Meds  Problems  Med Hx  Surg Hx  Fam Hx      Review of Systems:  No other skin or systemic complaints except as noted in HPI or Assessment and Plan.  Objective  Well appearing patient in no apparent distress; mood and affect are within normal limits.  A focused examination was performed including face, chest, arms. Relevant physical exam findings are noted in the Assessment and Plan.  R upper arm x 3, L elbow x 2, L forearm x  4 (9) Pink scaly macules  Right Upper Back Stuck-on, waxy, tan-brown papule or plaque --Discussed benign etiology and prognosis.   L upper arm x 1 Hyperkeratotic scaly papule    Assessment & Plan   Actinic Damage - chronic, secondary to cumulative UV radiation exposure/sun exposure over time - diffuse scaly erythematous macules with underlying dyspigmentation - Recommend daily broad spectrum sunscreen SPF 30+ to sun-exposed areas, reapply every 2 hours as needed.  - Recommend staying in the shade or wearing long sleeves, sun glasses (UVA+UVB protection) and wide brim hats (4-inch brim around the entire circumference of the hat). - Call for new or changing lesions. - S/p 5FU/calcipotriene to chest. Adequate response. Will monitor for now, though may need additional therapy in future  AK (actinic keratosis) (9) R upper arm x 3, L elbow x 2, L forearm x  4  Recheck R upper arm on f/u  Actinic keratoses are precancerous spots that appear secondary to cumulative UV radiation exposure/sun exposure over time. They are chronic with expected  duration over 1 year. A portion of actinic keratoses will progress to squamous cell carcinoma of the skin. It is not possible to reliably predict which spots will progress to skin cancer and so treatment is recommended to prevent development of skin cancer.  Recommend daily broad spectrum sunscreen SPF 30+ to sun-exposed areas, reapply every 2 hours as needed.  Recommend staying in the shade or wearing long sleeves, sun glasses (UVA+UVB protection) and wide brim hats (4-inch brim around the entire circumference of the hat). Call for new or changing lesions.   Destruction of lesion - R upper arm x 3, L elbow x 2, L forearm x  4  Destruction method: cryotherapy   Informed consent: discussed and consent obtained   Lesion destroyed using liquid nitrogen: Yes   Region frozen until ice ball extended beyond lesion: Yes   Outcome: patient tolerated procedure well with no complications   Post-procedure details: wound care instructions given   Additional details:  Prior to procedure, discussed risks of blister formation, small wound, skin dyspigmentation, or rare scar following cryotherapy. Recommend Vaseline ointment to treated areas while healing.   Seborrheic keratosis Right Upper Back Benign-appearing.  Observation.  Call clinic for new or changing lesions.     Hypertrophic actinic keratosis L upper arm x 1  Actinic keratoses are precancerous spots that appear secondary to cumulative UV radiation exposure/sun exposure over time. They are chronic with expected duration over 1 year. A portion of actinic keratoses will progress to  squamous cell carcinoma of the skin. It is not possible to reliably predict which spots will progress to skin cancer and so treatment is recommended to prevent development of skin cancer.  Recommend daily broad spectrum sunscreen SPF 30+ to sun-exposed areas, reapply every 2 hours as needed.  Recommend staying in the shade or wearing long sleeves, sun glasses (UVA+UVB  protection) and wide brim hats (4-inch brim around the entire circumference of the hat). Call for new or changing lesions.   Destruction of lesion - L upper arm x 1  Destruction method: cryotherapy   Informed consent: discussed and consent obtained   Lesion destroyed using liquid nitrogen: Yes   Region frozen until ice ball extended beyond lesion: Yes   Outcome: patient tolerated procedure well with no complications   Post-procedure details: wound care instructions given   Additional details:  Prior to procedure, discussed risks of blister formation, small wound, skin dyspigmentation, or rare scar following cryotherapy. Recommend Vaseline ointment to treated areas while healing.    Return in about 5 months (around 02/25/2023).  I, Othelia Pulling, RMA, am acting as scribe for Forest Gleason, MD .  Documentation: I have reviewed the above documentation for accuracy and completeness, and I agree with the above.  Forest Gleason, MD

## 2022-09-26 NOTE — Patient Instructions (Signed)
Cryotherapy Aftercare  Wash gently with soap and water everyday.   Apply Vaseline and Band-Aid daily until healed.     Due to recent changes in healthcare laws, you may see results of your pathology and/or laboratory studies on MyChart before the doctors have had a chance to review them. We understand that in some cases there may be results that are confusing or concerning to you. Please understand that not all results are received at the same time and often the doctors may need to interpret multiple results in order to provide you with the best plan of care or course of treatment. Therefore, we ask that you please give us 2 business days to thoroughly review all your results before contacting the office for clarification. Should we see a critical lab result, you will be contacted sooner.   If You Need Anything After Your Visit  If you have any questions or concerns for your doctor, please call our main line at [phone] and press option 4 to reach your doctor's medical assistant. If no one answers, please leave a voicemail as directed and we will return your call as soon as possible. Messages left after 4 pm will be answered the following business day.   You may also send us a message via MyChart. We typically respond to MyChart messages within 1-2 business days.  For prescription refills, please ask your pharmacy to contact our office. Our fax number is [phone].  If you have an urgent issue when the clinic is closed that cannot wait until the next business day, you can page your doctor at the number below.    Please note that while we do our best to be available for urgent issues outside of office hours, we are not available 24/7.   If you have an urgent issue and are unable to reach us, you may choose to seek medical care at your doctor's office, retail clinic, urgent care center, or emergency room.  If you have a medical emergency, please immediately call 911 or go to the  emergency department.  Pager Numbers  - Dr. Kowalski: [phone]  - Dr. Moye: [phone]  - Dr. Stewart: [phone]  In the event of inclement weather, please call our main line at [phone] for an update on the status of any delays or closures.  Dermatology Medication Tips: Please keep the boxes that topical medications come in in order to help keep track of the instructions about where and how to use these. Pharmacies typically print the medication instructions only on the boxes and not directly on the medication tubes.   If your medication is too expensive, please contact our office at [phone] option 4 or send us a message through MyChart.   We are unable to tell what your co-pay for medications will be in advance as this is different depending on your insurance coverage. However, we may be able to find a substitute medication at lower cost or fill out paperwork to get insurance to cover a needed medication.   If a prior authorization is required to get your medication covered by your insurance company, please allow us 1-2 business days to complete this process.  Drug prices often vary depending on where the prescription is filled and some pharmacies may offer cheaper prices.  The website www.goodrx.com contains coupons for medications through different pharmacies. The prices here do not account for what the cost may be with help from insurance (it may be cheaper with your insurance), but the website can   give you the price if you did not use any insurance.  - You can print the associated coupon and take it with your prescription to the pharmacy.  - You may also stop by our office during regular business hours and pick up a GoodRx coupon card.  - If you need your prescription sent electronically to a different pharmacy, notify our office through [facility] MyChart or by phone at [phone] option 4.     Si Usted Necesita Algo Despus de Su Visita  Tambin puede  enviarnos un mensaje a travs de MyChart. Por lo general respondemos a los mensajes de MyChart en el transcurso de 1 a 2 das hbiles.  Para renovar recetas, por favor pida a su farmacia que se ponga en contacto con nuestra oficina. Nuestro nmero de fax es el [phone].  Si tiene un asunto urgente cuando la clnica est cerrada y que no puede esperar hasta el siguiente da hbil, puede llamar/localizar a su doctor(a) al nmero que aparece a continuacin.   Por favor, tenga en cuenta que aunque hacemos todo lo posible para estar disponibles para asuntos urgentes fuera del horario de oficina, no estamos disponibles las 24 horas del da, los 7 das de la semana.   Si tiene un problema urgente y no puede comunicarse con nosotros, puede optar por buscar atencin mdica  en el consultorio de su doctor(a), en una clnica privada, en un centro de atencin urgente o en una sala de emergencias.  Si tiene una emergencia mdica, por favor llame inmediatamente al 911 o vaya a la sala de emergencias.  Nmeros de bper  - Dr. Kowalski: [phone]  - Dra. Moye: [phone]  - Dra. Stewart: [phone]  En caso de inclemencias del tiempo, por favor llame a nuestra lnea principal al [phone] para una actualizacin sobre el estado de cualquier retraso o cierre.  Consejos para la medicacin en dermatologa: Por favor, guarde las cajas en las que vienen los medicamentos de uso tpico para ayudarle a seguir las instrucciones sobre dnde y cmo usarlos. Las farmacias generalmente imprimen las instrucciones del medicamento slo en las cajas y no directamente en los tubos del medicamento.   Si su medicamento es muy caro, por favor, pngase en contacto con nuestra oficina llamando al [phone] y presione la opcin 4 o envenos un mensaje a travs de MyChart.   No podemos decirle cul ser su copago por los medicamentos por adelantado ya que esto es diferente dependiendo de la cobertura de su seguro.  Sin embargo, es posible que podamos encontrar un medicamento sustituto a menor costo o llenar un formulario para que el seguro cubra el medicamento que se considera necesario.   Si se requiere una autorizacin previa para que su compaa de seguros cubra su medicamento, por favor permtanos de 1 a 2 das hbiles para completar este proceso.  Los precios de los medicamentos varan con frecuencia dependiendo del lugar de dnde se surte la receta y alguna farmacias pueden ofrecer precios ms baratos.  El sitio web www.goodrx.com tiene cupones para medicamentos de diferentes farmacias. Los precios aqu no tienen en cuenta lo que podra costar con la ayuda del seguro (puede ser ms barato con su seguro), pero el sitio web puede darle el precio si no utiliz ningn seguro.  - Puede imprimir el cupn correspondiente y llevarlo con su receta a la farmacia.  - Tambin puede pasar por nuestra oficina durante el horario de atencin regular y recoger una tarjeta de cupones de GoodRx.  -   Si necesita que su receta se enve electrnicamente a una farmacia diferente, informe a nuestra oficina a travs de MyChart de [facility] o por telfono llamando al [phone] y presione la opcin 4.  

## 2022-09-27 ENCOUNTER — Telehealth: Payer: Self-pay | Admitting: *Deleted

## 2022-09-27 NOTE — Telephone Encounter (Signed)
Per MD request, RN placed call to pt regarding recent bone density being WNL from [facility].  Pt verbalized understanding.

## 2022-09-28 ENCOUNTER — Encounter: Payer: Self-pay | Admitting: Hematology and Oncology

## 2022-09-30 ENCOUNTER — Encounter: Payer: Self-pay | Admitting: Dermatology

## 2022-10-09 ENCOUNTER — Encounter: Payer: Self-pay | Admitting: Surgical

## 2022-10-09 ENCOUNTER — Ambulatory Visit: Payer: Self-pay | Admitting: Surgical

## 2022-10-09 VITALS — BP 133/74 | HR 70 | Ht 64.0 in | Wt 170.0 lb

## 2022-10-09 DIAGNOSIS — Z719 Counseling, unspecified: Secondary | ICD-10-CM

## 2022-10-09 NOTE — Progress Notes (Signed)
Referring Provider Adin Hector, MD [place],  [place] [id number]   CC:  Chief Complaint  Patient presents with   Cosmetic Visit      Katelyn Ball is an 76 y.o. female.  HPI: Patient is a 76 year old female here for evaluation of her face, to discuss some cosmetic concerns.  She is particularly bothered by fine lines and wrinkling around her mouth and lower face.  She is interested in various options for improvement.  She particularly has questions about Botox, fillers as well as lasers.  She does not have any autoimmune disease.  She is not on blood thinners.  Allergies  Allergen Reactions   Pneumococcal Vaccine Swelling   Ciprofloxacin     Unknown    Cyclobenzaprine Nausea Only   Cymbalta [Duloxetine Hcl] Nausea Only   Demerol [Meperidine] Nausea Only   Erythromycin     Jaundice   Fiorinal [Butalbital-Aspirin-Caffeine]    Fish Oil Nausea Only   Motrin [Ibuprofen] Nausea Only   Naproxen Nausea Only   Nitrofurantoin Nausea Only    Outpatient Encounter Medications as of 10/09/2022  Medication Sig   acidophilus (RISAQUAD) CAPS capsule Take 1 capsule by mouth daily.   BLACK CURRANT SEED OIL PO Take 1 capsule by mouth in the morning and at bedtime.   Cholecalciferol 125 MCG (5000 UT) TABS Take 5,000 Units by mouth daily.   clonazePAM (KLONOPIN) 1 MG tablet Take 1 mg by mouth at bedtime.   Cyanocobalamin 5000 MCG SUBL Place 5,000 mcg under the tongue daily.   fenofibrate 160 MG tablet Take 160 mg by mouth at bedtime.   gabapentin (NEURONTIN) 100 MG capsule Take 100 mg by mouth at bedtime.   gabapentin (NEURONTIN) 400 MG capsule Take 400 mg by mouth at bedtime.   letrozole (FEMARA) 2.5 MG tablet Take 1 tablet (2.5 mg total) by mouth daily.   metoprolol succinate (TOPROL-XL) 25 MG 24 hr tablet Take 37.5 mg by mouth daily.   montelukast (SINGULAIR) 10 MG tablet Take 10 mg by mouth in the morning.   pantoprazole (PROTONIX) 40 MG  tablet Take 40 mg by mouth daily.   rosuvastatin (CRESTOR) 5 MG tablet Take 5 mg by mouth every Saturday.   thyroid (ARMOUR) 30 MG tablet Take 90 mg by mouth daily before breakfast.   No facility-administered encounter medications on file as of 10/09/2022.     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   right upper arm, ATYPICAL SQUAMOUS PROLIFERATION, 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, re-biopsied and treated with [facility] 11/15/21   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   left posterior calf inferior to popliteal fossa   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   left upper cutaneous lip   Vitamin D deficiency     Past Surgical History:  Procedure Laterality Date   ABDOMINAL HYSTERECTOMY     BACK SURGERY     BREAST BIOPSY Right 03/26/2022   u/s bx rt 12:00 5cmfn venus clip path pending   BREAST CYST ASPIRATION Left    BREAST CYST ASPIRATION Right 09/25/2017   2 FNA's done on cysts   CHOLECYSTECTOMY     COLONOSCOPY WITH PROPOFOL N/A 10/05/2015   Procedure: COLONOSCOPY WITH PROPOFOL;  Surgeon: Manya Silvas, MD;  Location: [facility] ENDOSCOPY;  Service: Endoscopy;  Laterality: N/A;   ESOPHAGOGASTRODUODENOSCOPY (EGD) WITH PROPOFOL N/A 10/05/2015   Procedure: ESOPHAGOGASTRODUODENOSCOPY (EGD) WITH PROPOFOL;  Surgeon: Manya Silvas, MD;  Location: [facility] ENDOSCOPY;  Service: Endoscopy;  Laterality: N/A;   JOINT REPLACEMENT Bilateral 11/2012   LAMINECTOMY AND  MICRODISCECTOMY LUMBAR SPINE     MASTECTOMY W/ SENTINEL NODE BIOPSY Right 04/23/2022   Procedure: RIGHT MASTECTOMY;  Surgeon: Jovita Kussmaul, MD;  Location: [place];  Service: General;  Laterality: Right;   PILONIDAL CYST DRAINAGE     SENTINEL NODE BIOPSY Right 04/23/2022   Procedure: RIGHT SENTINEL NODE BIOPSY;  Surgeon: Jovita Kussmaul, MD;  Location: [place];  Service: General;  Laterality: Right;   TONSILLECTOMY      Family History  Problem Relation Age of Onset   Rectal cancer Mother 26   Prostate cancer Father        dx. 44s, metastatic   Lymphoma Maternal Uncle    Breast cancer Neg Hx     Social History   Social History Narrative   Not on file     Review of Systems General: Denies fevers, chills, weight loss   Physical Exam    10/09/2022    1:23 PM 09/10/2022    3:58 PM 08/16/2022   11:56 AM  Vitals with BMI  Height '5\' 4"'$   '5\' 5"'$   Weight 170 lbs 174 lbs 8 oz 173 lbs 14 oz  BMI 13.08  65.78  Systolic 469  629  Diastolic 74  71  Pulse 70  61    General:  No acute distress,  Alert and oriented, Non-Toxic, Normal speech and affect Face: Wrinkling noted of lower face, particularly around the mouth, fine lines and wrinkles noted along the lower cheeks/chin.  Mild hyperpigmentation noted throughout entire face.  Assessment/Plan:  Patient and I spent approximately 30 minutes discussing her options. We discussed various options, particularly Botox for the forehead lines, glabellar lines and lateral canthal lines.  We also discussed filler for the lines of her perioral area.  Overall I do think that her best option would be halo laser for improvement in texture, pigment, fine-line wrinkling.  We did discuss that the best recommendation for improvement in the lower face and neck area would be a neck/facelift, however this would of course require surgical intervention.  Patient is understanding of this, she is particularly interested in the halo laser.  We discussed the risks  associated with this, all of her questions were answered to her content.  We will plan to see her January or February for halo laser.   Carola Rhine Khali Perella 10/09/2022, 4:01 PM

## 2022-12-07 ENCOUNTER — Ambulatory Visit
Admission: RE | Admit: 2022-12-07 | Discharge: 2022-12-07 | Disposition: A | Discharge status: Home / Self Care | Payer: Medicare HMO | Source: Ambulatory Visit | Attending: Acute Care | Admitting: Acute Care

## 2022-12-07 DIAGNOSIS — Z87891 Personal history of nicotine dependence: Secondary | ICD-10-CM

## 2022-12-10 ENCOUNTER — Ambulatory Visit: Payer: Medicare HMO | Attending: General Surgery

## 2022-12-10 VITALS — Wt 181.2 lb

## 2022-12-10 DIAGNOSIS — Z483 Aftercare following surgery for neoplasm: Secondary | ICD-10-CM | POA: Insufficient documentation

## 2022-12-10 NOTE — Therapy (Signed)
OUTPATIENT PHYSICAL THERAPY SOZO SCREENING NOTE   Patient Name: Katelyn Ball MRN: [id number] DOB:1946-09-30, 77 y.o., female Today's Date: 12/10/2022  PCP: Adin Hector, MD REFERRING PROVIDER: Jovita Kussmaul, MD   PT End of Session - 12/10/22 1648     Visit Number 1   # unchanged due to screen only   PT Start Time 1646    PT Stop Time 1650    PT Time Calculation (min) 4 min    Activity Tolerance Patient tolerated treatment well    Behavior During Therapy WFL for tasks assessed/performed             Past Medical History:  Diagnosis Date   Actinic keratosis    Anemia    Anxiety    Asthma    Atelectasis    B12 deficiency    Cancer ([place])    skin   COPD (chronic obstructive pulmonary disease) ([place])    DDD (degenerative disc disease), cervical    Dysrhythmia    GERD (gastroesophageal reflux disease)    History of chicken pox    Hyperlipidemia    Hypertension    Hypothyroidism    Iritis    Meniere disease    PONV (postoperative nausea and vomiting)    Pre-diabetes    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   right upper arm, ATYPICAL SQUAMOUS PROLIFERATION, 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, re-biopsied and treated with [facility] 11/15/21   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   left posterior calf inferior to popliteal fossa   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   left upper cutaneous lip   Vitamin D deficiency    Past Surgical History:  Procedure Laterality Date   ABDOMINAL HYSTERECTOMY     BACK SURGERY     BREAST BIOPSY Right 03/26/2022   u/s bx rt 12:00 5cmfn venus clip path pending   BREAST CYST ASPIRATION Left    BREAST CYST ASPIRATION Right 09/25/2017   2 FNA's done on cysts   CHOLECYSTECTOMY     COLONOSCOPY WITH PROPOFOL N/A 10/05/2015   Procedure: COLONOSCOPY WITH PROPOFOL;  Surgeon: Manya Silvas, MD;  Location: [facility] ENDOSCOPY;  Service: Endoscopy;  Laterality: N/A;    ESOPHAGOGASTRODUODENOSCOPY (EGD) WITH PROPOFOL N/A 10/05/2015   Procedure: ESOPHAGOGASTRODUODENOSCOPY (EGD) WITH PROPOFOL;  Surgeon: Manya Silvas, MD;  Location: [facility] ENDOSCOPY;  Service: Endoscopy;  Laterality: N/A;   JOINT REPLACEMENT Bilateral 11/2012   LAMINECTOMY AND MICRODISCECTOMY LUMBAR SPINE     MASTECTOMY W/ SENTINEL NODE BIOPSY Right 04/23/2022   Procedure: RIGHT MASTECTOMY;  Surgeon: Jovita Kussmaul, MD;  Location: [place];  Service: General;  Laterality: Right;   PILONIDAL CYST DRAINAGE     SENTINEL NODE BIOPSY Right 04/23/2022   Procedure: RIGHT SENTINEL NODE BIOPSY;  Surgeon: Jovita Kussmaul, MD;  Location: [place];  Service: General;  Laterality: Right;   TONSILLECTOMY     Patient Active Problem List   Diagnosis Date Noted   Cancer of right female breast ([place]) 04/23/2022   Genetic testing 04/20/2022   Malignant neoplasm of upper-outer quadrant of right breast in female, estrogen receptor positive ([place]) 04/04/2022   Prediabetes 03/27/2022   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   Vitamin D deficiency 05/24/2021   Vaginal enterocele 05/24/2021   Colonic polyp 05/24/2021   GERD (gastroesophageal reflux disease) 05/24/2021   Chronic obstructive pulmonary disease, unspecified ([place]) 05/24/2021   Allergic rhinitis due to allergen 05/24/2021   Coronary artery calcification seen  on CT scan 12/18/2017   Aortic atherosclerosis ([place]) 12/18/2017   Recurrent major depressive disorder, in full remission ([place]) 10/18/2016   Diverticulosis of large intestine without hemorrhage 10/18/2015   Trochanteric bursitis of right hip 01/25/2015   Lumbar radiculitis 01/25/2015   Pure hypercholesterolemia 11/02/2014   Essential hypertension 11/02/2014   B12 deficiency 09/15/2014    REFERRING DIAG: right breast cancer at risk for lymphedema  THERAPY DIAG:  Aftercare following surgery for neoplasm  PERTINENT HISTORY: Patient was diagnosed on 02/19/2022 with 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 She underwent a right mastectomy and sentinel node biopsy (5 negative nodes removed) on 04/23/2022. It is ER/PR positive and HER2 negative with an unknown Ki67.  PRECAUTIONS: right UE Lymphedema risk, None  SUBJECTIVE: Pt returns for her 3 month L-Dex screen.   PAIN:  Are you having pain? No  SOZO SCREENING: Patient was assessed today using the SOZO machine to determine the lymphedema index score. This was compared to her baseline score. It was determined that she is within the recommended range when compared to her baseline and no further action is needed at this time. She will continue SOZO screenings. These are done every 3 months for 2 years post operatively followed by every 6 months for 2 years, and then annually.   L-DEX FLOWSHEETS - 12/10/22 1600       L-DEX LYMPHEDEMA SCREENING   Measurement Type Unilateral    L-DEX MEASUREMENT EXTREMITY Upper Extremity    POSITION  Standing    DOMINANT SIDE Right    At Risk Side Right    BASELINE SCORE (UNILATERAL) -0.3    L-DEX SCORE (UNILATERAL) 4.1    VALUE CHANGE (UNILAT) 4.4              Otelia Limes, PTA 12/10/2022, 4:50 PM

## 2023-02-13 NOTE — Progress Notes (Signed)
Patient Care Team: Adin Hector, MD as PCP - General (Internal Medicine) Jovita Kussmaul, MD as Consulting Physician (General Surgery) Nicholas Lose, MD as Consulting Physician (Hematology and Oncology) Eppie Gibson, MD as Attending Physician (Radiation Oncology)  DIAGNOSIS: No diagnosis found.  SUMMARY OF ONCOLOGIC HISTORY: Oncology History  Malignant neoplasm of upper-outer quadrant of right breast in female, estrogen receptor positive ([place])  03/26/2022 Initial Diagnosis   Screening detected right breast distortion at 12:00: 1.1 cm, axilla negative, ultrasound biopsy: Grade 1 ILC, ER greater than 90%, PR greater than 90%, HER2 negative, Ki-67 not done   04/04/2022 Cancer Staging   Staging form: Breast, AJCC 8th Edition - Clinical: Stage IA (cT1c, cN0, cM0, G1, ER+, PR+, HER2-) - Signed by Nicholas Lose, MD on 04/04/2022 Stage prefix: Initial diagnosis Histologic grading system: 3 grade system    Genetic Testing   [facility] CustomNext Panel was Negative. Report date is 04/19/2022.  The CustomNext-Cancer+RNAinsight panel offered by [facility] includes sequencing and rearrangement analysis for the following 47 genes:  APC, ATM, AXIN2, BARD1, BMPR1A, BRCA1, BRCA2, BRIP1, CDH1, CDK4, CDKN2A, CHEK2, CTNNA1, DICER1, EPCAM, GREM1, HOXB13, KIT, MEN1, MLH1, MSH2, MSH3, MSH6, MUTYH, NBN, NF1, NTHL1, PALB2, PDGFRA, PMS2, POLD1, POLE, PTEN, RAD50, RAD51C, RAD51D, SDHA, SDHB, SDHC, SDHD, SMAD4, SMARCA4, STK11, TP53, TSC1, TSC2, and VHL.  RNA data is routinely analyzed for use in variant interpretation for all genes.   04/23/2022 Surgery   Right Mastectomy: Grade 2 ILC 2.1 cm, LG DCIS, ALH, margins Neg, Benign IM LN, 0/5 LN Neg, ER > 90%, PR > 90%, HER2 negative, Ki-67 not done   06/02/2022 -  Anti-estrogen oral therapy   Letrozole 2.5 mg daily x7 years      CHIEF COMPLIANT: Right breast cancer  INTERVAL HISTORY: JAZMYNE Ball is a 77 y.o. female is here because of recent diagnosis of right  breast cancer. She presents to the clinic today for a follow-up.   ALLERGIES:  is allergic to pneumococcal vaccine, ciprofloxacin, cyclobenzaprine, cymbalta [duloxetine hcl], demerol [meperidine], erythromycin, fiorinal [butalbital-aspirin-caffeine], fish oil, motrin [ibuprofen], naproxen, and nitrofurantoin.  MEDICATIONS:  Current Outpatient Medications  Medication Sig Dispense Refill   acidophilus (RISAQUAD) CAPS capsule Take 1 capsule by mouth daily.     BLACK CURRANT SEED OIL PO Take 1 capsule by mouth in the morning and at bedtime.     Cholecalciferol 125 MCG (5000 UT) TABS Take 5,000 Units by mouth daily.     clonazePAM (KLONOPIN) 1 MG tablet Take 1 mg by mouth at bedtime.     Cyanocobalamin 5000 MCG SUBL Place 5,000 mcg under the tongue daily.     fenofibrate 160 MG tablet Take 160 mg by mouth at bedtime.     gabapentin (NEURONTIN) 100 MG capsule Take 100 mg by mouth at bedtime.     gabapentin (NEURONTIN) 400 MG capsule Take 400 mg by mouth at bedtime.     letrozole (FEMARA) 2.5 MG tablet Take 1 tablet (2.5 mg total) by mouth daily. 90 tablet 3   metoprolol succinate (TOPROL-XL) 25 MG 24 hr tablet Take 37.5 mg by mouth daily.     montelukast (SINGULAIR) 10 MG tablet Take 10 mg by mouth in the morning.     pantoprazole (PROTONIX) 40 MG tablet Take 40 mg by mouth daily.     rosuvastatin (CRESTOR) 5 MG tablet Take 5 mg by mouth every Saturday.     thyroid (ARMOUR) 30 MG tablet Take 90 mg by mouth daily before breakfast.  No current facility-administered medications for this visit.    PHYSICAL EXAMINATION: ECOG PERFORMANCE STATUS: {CHL ONC ECOG PS:[phone]}  There were no vitals filed for this visit. There were no vitals filed for this visit.  BREAST:*** No palpable masses or nodules in either right or left breasts. No palpable axillary supraclavicular or infraclavicular adenopathy no breast tenderness or nipple discharge. (exam performed in the presence of a  chaperone)  LABORATORY DATA:  I have reviewed the data as listed    Latest Ref Rng & Units 04/20/2022   11:11 AM 04/04/2022   12:31 PM 01/08/2019    2:08 PM  CMP  Glucose 70 - 99 mg/dL 94  140    BUN 8 - 23 mg/dL 19  20    Creatinine 0.44 - 1.00 mg/dL 0.89  1.05  1.10   Sodium 135 - 145 mmol/L 139  130    Potassium 3.5 - 5.1 mmol/L 4.5  4.3    Chloride 98 - 111 mmol/L 105  98    CO2 22 - 32 mmol/L 29  28    Calcium 8.9 - 10.3 mg/dL 9.7  9.2    Total Protein 6.5 - 8.1 g/dL  6.2    Total Bilirubin 0.3 - 1.2 mg/dL  0.5    Alkaline Phos 38 - 126 U/L  74    AST 15 - 41 U/L  24    ALT 0 - 44 U/L  18      Lab Results  Component Value Date   WBC 6.1 04/04/2022   HGB 12.0 04/04/2022   HCT 34.7 (L) 04/04/2022   MCV 90.1 04/04/2022   PLT 275 04/04/2022   NEUTROABS 4.1 04/04/2022    ASSESSMENT & PLAN:  No problem-specific Assessment & Plan notes found for this encounter.    No orders of the defined types were placed in this encounter.  The patient has a good understanding of the overall plan. she agrees with it. she will call with any problems that may develop before the next visit here. Total time spent: 30 mins including face to face time and time spent for planning, charting and co-ordination of care   Suzzette Righter, [place] 02/13/23    I Gardiner Coins am acting as a Education administrator for [employer]  ***

## 2023-02-19 ENCOUNTER — Inpatient Hospital Stay: Payer: Medicare HMO | Attending: Hematology and Oncology | Admitting: Hematology and Oncology

## 2023-02-19 ENCOUNTER — Other Ambulatory Visit: Payer: Self-pay

## 2023-02-19 VITALS — BP 150/72 | HR 79 | Temp 97.7°F | Resp 18 | Ht 64.0 in | Wt 182.5 lb

## 2023-02-19 DIAGNOSIS — Z170 Estrogen receptor positive status [ER+]: Secondary | ICD-10-CM | POA: Diagnosis not present

## 2023-02-19 DIAGNOSIS — C50411 Malignant neoplasm of upper-outer quadrant of right female breast: Secondary | ICD-10-CM | POA: Insufficient documentation

## 2023-02-19 DIAGNOSIS — Z9011 Acquired absence of right breast and nipple: Secondary | ICD-10-CM | POA: Insufficient documentation

## 2023-02-19 DIAGNOSIS — Z79811 Long term (current) use of aromatase inhibitors: Secondary | ICD-10-CM | POA: Insufficient documentation

## 2023-02-19 DIAGNOSIS — Z79899 Other long term (current) drug therapy: Secondary | ICD-10-CM | POA: Diagnosis not present

## 2023-02-19 MED ORDER — ANASTROZOLE 1 MG PO TABS: 1.0000 mg | ORAL_TABLET | Freq: Every day | ORAL | 0 refills | Status: DC

## 2023-02-19 NOTE — Assessment & Plan Note (Addendum)
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  She could not tolerate Effexor or Paxil.   Letrozole toxicities: Weight gain and hot flashes: I had recommended stopping letrozole and then switching to anastrozole.  Breast cancer surveillance:  Mammogram will need to be ordered on the left breast Breast chest wall exam 02/19/2023: Benign  Telephone visit in 6 weeks to discuss tolerance to anastrozole therapy.

## 2023-02-25 ENCOUNTER — Other Ambulatory Visit
Admission: RE | Admit: 2023-02-25 | Discharge: 2023-02-25 | Disposition: A | Discharge status: Home / Self Care | Payer: Medicare HMO | Attending: Urology | Admitting: Urology

## 2023-02-25 ENCOUNTER — Ambulatory Visit (INDEPENDENT_AMBULATORY_CARE_PROVIDER_SITE_OTHER): Payer: Medicare HMO | Admitting: Urology

## 2023-02-25 ENCOUNTER — Encounter: Payer: Self-pay | Admitting: Urology

## 2023-02-25 ENCOUNTER — Other Ambulatory Visit: Payer: Medicare Other

## 2023-02-25 ENCOUNTER — Other Ambulatory Visit: Payer: Self-pay

## 2023-02-25 VITALS — BP 116/72 | HR 80 | Ht 64.0 in | Wt 182.0 lb

## 2023-02-25 DIAGNOSIS — N390 Urinary tract infection, site not specified: Secondary | ICD-10-CM

## 2023-02-25 DIAGNOSIS — R399 Unspecified symptoms and signs involving the genitourinary system: Secondary | ICD-10-CM | POA: Insufficient documentation

## 2023-02-25 LAB — URINALYSIS, COMPLETE (UACMP) WITH MICROSCOPIC
Bilirubin Urine: NEGATIVE
Glucose, UA: NEGATIVE mg/dL
Hgb urine dipstick: NEGATIVE
Ketones, ur: NEGATIVE mg/dL
Nitrite: NEGATIVE
Protein, ur: NEGATIVE mg/dL
Specific Gravity, Urine: 1.01 (ref 1.005–1.030)
WBC, UA: >50 WBC/hpf (ref 0–5)
pH: 5.5 (ref 5.0–8.0)

## 2023-02-25 NOTE — Progress Notes (Signed)
Patient had to reschedule.

## 2023-02-26 NOTE — Progress Notes (Signed)
02/27/2023 7:46 PM   Katelyn Ball October 08, 1946 [id number]  Referring provider: Lynnea FerrierKlein, Bert J III, MD [address] [facility] [place] [id number]  Urological history: 1.  High risk hematuria -Former smoker -Hematuria secondary to UTI's  2.  rUTI's -Contributing factors of age, vaginal atrophy, prediabetes, vitamin D deficiency and constipation. -Documented urine culture over the last year   July 04, 2022-mixed urogenital flora  June 18, 2022-no growth  May 21, 2022-beta-hemolytic Streptococcus group B  May 04, 2022-E. Coli -Increased fluids and take, cranberry tablets, daily probiotic and d-mannose   Chief Complaint  Patient presents with   Recurrent UTI    HPI: Katelyn Ball is a 77 y.o. female who presents today for symptoms of UTI.  Noncontrast CT in August 2023 did not identify any nidus for recurrent UTI's.  No urinary tract calculi hydronephrosis were appreciated.  And circumferential bladder wall thickening likely secondary to cystitis.  And possible constipation.    She has been having issues with yeast infections and burning with urination.  She has been on short courses of Diflucan without resolution of her symptoms.  And then a few days ago she started developing burning with urination.  Patient denies any modifying or aggravating factors.  Patient denies any gross hematuria or suprapubic/flank pain.  Patient denies any fevers, chills, nausea or vomiting.    UA yellow clear, specific gravity 1.010, pH 5.5, small leukocyte, 0-5 epithelial cells, greater than 50 WBCs, 0-5 RBCs, few bacteria and WBC clumps present.  Urine culture pending.  [place] for the beach on Saturday for 3 weeks.   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   right upper arm, ATYPICAL SQUAMOUS PROLIFERATION, 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, [facility]   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   left posterior calf inferior to popliteal fossa   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   left upper cutaneous lip   Vitamin D deficiency     Surgical History: Past Surgical History:  Procedure Laterality Date   ABDOMINAL HYSTERECTOMY     BACK SURGERY     BREAST BIOPSY Right 03/26/2022   u/s bx rt 12:00 5cmfn venus clip path pending   BREAST CYST ASPIRATION Left    BREAST CYST ASPIRATION Right 09/25/2017   2 FNA's done on cysts   CHOLECYSTECTOMY     COLONOSCOPY WITH PROPOFOL N/A 10/05/2015   Procedure: COLONOSCOPY WITH PROPOFOL;  Surgeon: Scot Junobert T Elliott, MD;  [facility];  Service: Endoscopy;  Laterality: N/A;   ESOPHAGOGASTRODUODENOSCOPY (EGD) WITH PROPOFOL N/A 10/05/2015   Procedure: ESOPHAGOGASTRODUODENOSCOPY (EGD) WITH PROPOFOL;  Surgeon: Scot Junobert T Elliott, MD;  [facility];  Service: Endoscopy;  Laterality: N/A;   JOINT REPLACEMENT Bilateral 11/2012   LAMINECTOMY AND MICRODISCECTOMY LUMBAR SPINE  MASTECTOMY W/ SENTINEL NODE BIOPSY Right 04/23/2022   Procedure: RIGHT MASTECTOMY;  Surgeon: Griselda Miner, MD;  Location: [facility];  Service: General;  Laterality: Right;   PILONIDAL CYST DRAINAGE     SENTINEL NODE BIOPSY Right 04/23/2022   Procedure: RIGHT SENTINEL NODE BIOPSY;  Surgeon: Griselda Miner, MD;  Location: [facility] OR;  Service: General;  Laterality: Right;   TONSILLECTOMY      Home Medications:  Allergies as of 02/27/2023       Reactions   Pneumococcal  Vaccine Swelling   Ciprofloxacin    Unknown    Cyclobenzaprine Nausea Only   Cymbalta [duloxetine Hcl] Nausea Only   Demerol [meperidine] Nausea Only   Erythromycin    Jaundice   Fiorinal [butalbital-aspirin-caffeine]    Fish Oil Nausea Only   Motrin [ibuprofen] Nausea Only   Naproxen Nausea Only   Nitrofurantoin Nausea Only   Other Other (See Comments)   Other reaction(s): Other (See Comments), Severe local reaction, Severe local reaction   Duloxetine Other (See Comments)        Medication List        Accurate as of February 27, 2023 11:59 PM. If you have any questions, ask your nurse or doctor.          acidophilus Caps capsule Take 1 capsule by mouth daily.   anastrozole 1 MG tablet Commonly known as: ARIMIDEX Take 1 tablet (1 mg total) by mouth daily.   BLACK CURRANT SEED OIL PO Take 1 capsule by mouth in the morning and at bedtime.   buPROPion 150 MG 24 hr tablet Commonly known as: WELLBUTRIN XL Take by mouth.   Cholecalciferol 125 MCG (5000 UT) Tabs Take 5,000 Units by mouth daily.   clonazePAM 1 MG tablet Commonly known as: KLONOPIN Take 1 mg by mouth at bedtime.   Cyanocobalamin 5000 MCG Subl Place 5,000 mcg under the tongue daily.   fenofibrate 160 MG tablet Take 160 mg by mouth at bedtime.   fluconazole 150 MG tablet Commonly known as: DIFLUCAN Take 150 mg by mouth once.   gabapentin 100 MG capsule Commonly known as: NEURONTIN Take 100 mg by mouth at bedtime.   gabapentin 400 MG capsule Commonly known as: NEURONTIN Take 400 mg by mouth at bedtime.   metoprolol succinate 25 MG 24 hr tablet Commonly known as: TOPROL-XL Take 37.5 mg by mouth daily.   montelukast 10 MG tablet Commonly known as: SINGULAIR Take 10 mg by mouth in the morning.   pantoprazole 40 MG tablet Commonly known as: PROTONIX Take 40 mg by mouth daily.   rosuvastatin 5 MG tablet Commonly known as: CRESTOR Take 5 mg by mouth every Saturday.   thyroid 30 MG  tablet Commonly known as: ARMOUR Take 90 mg by mouth daily before breakfast.   triamterene-hydrochlorothiazide 37.5-25 MG tablet Commonly known as: MAXZIDE-25 Take 1 tablet by mouth daily.        Allergies:  Allergies  Allergen Reactions   Pneumococcal Vaccine Swelling   Ciprofloxacin     Unknown    Cyclobenzaprine Nausea Only   Cymbalta [Duloxetine Hcl] Nausea Only   Demerol [Meperidine] Nausea Only   Erythromycin     Jaundice   Fiorinal [Butalbital-Aspirin-Caffeine]    Fish Oil Nausea Only   Motrin [Ibuprofen] Nausea Only   Naproxen Nausea Only   Nitrofurantoin Nausea Only   Other Other (See Comments)    Other reaction(s): Other (See Comments), Severe local reaction, Severe local reaction   Duloxetine Other (See  Comments)    Family History: Family History  Problem Relation Age of Onset   Rectal cancer Mother 105   Prostate cancer Father        dx. 29s, metastatic   Lymphoma Maternal Uncle    Breast cancer Neg Hx     Social History:  reports that she quit smoking about 14 years ago. Her smoking use included cigarettes. She has a 20.00 pack-year smoking history. She has never used smokeless tobacco. She reports that she does not drink alcohol and does not use drugs.  ROS: Pertinent ROS in HPI  Physical Exam: BP 102/63   Pulse 71   Ht  (1.676 m)   Wt 182 lb (82.6 kg)   BMI 29.38 kg/m   Constitutional:  Well nourished. Alert and oriented, No acute distress. HEENT: [place] AT, moist mucus membranes.  Trachea midline Cardiovascular: No clubbing, cyanosis, or edema. Respiratory: Normal respiratory effort, no increased work of breathing. Neurologic: Grossly intact, no focal deficits, moving all 4 extremities. Psychiatric: Normal mood and affect.    Laboratory Data: Serum creatinine (12/2022) 1.3 with a glomerular filtration rate of 43 Hemoglobin A1c (01/2023) 5.7  Urinalysis Component     Latest Ref Rng 02/25/2023  Glucose, UA     NEGATIVE mg/dL NEGATIVE    WBC, UA     0 - 5 WBC/hpf >50   Bacteria, UA     NONE SEEN  FEW !   Color, Urine     YELLOW  YELLOW   Appearance     CLEAR  CLEAR   Specific Gravity, Urine     1.005 - 1.030  1.010   pH     5.0 - 8.0  5.5   Hgb urine dipstick     NEGATIVE  NEGATIVE   Bilirubin Urine     NEGATIVE  NEGATIVE   Ketones, ur     NEGATIVE mg/dL NEGATIVE   Protein     NEGATIVE mg/dL NEGATIVE   Nitrite     NEGATIVE  NEGATIVE   Leukocytes,Ua     NEGATIVE  SMALL !   Squamous Epithelial / HPF     0 - 5 /HPF 0-5   RBC / HPF     0 - 5 RBC/hpf 0-5   WBC Clumps PRESENT     Legend: ! Abnormal I have reviewed the labs.   Pertinent Imaging: CLINICAL DATA:  Recurrent urinary tract infections x2 since mastectomy 8 weeks ago. Remote laparoscopic cholecystectomy. Dysuria and hematuria this morning.   EXAM: CT ABDOMEN AND PELVIS WITHOUT CONTRAST   TECHNIQUE: Multidetector CT imaging of the abdomen and pelvis was performed following the standard protocol without IV contrast.   RADIATION DOSE REDUCTION: This exam was performed according to the departmental dose-optimization program which includes automated exposure control, adjustment of the mA and/or kV according to patient size and/or use of iterative reconstruction technique.   COMPARISON:  01/23/2021   FINDINGS: Lower chest: Bibasilar scarring. Normal heart size without pericardial or pleural effusion. Right coronary artery calcification.   Hepatobiliary: Normal liver. Cholecystectomy, without biliary ductal dilatation.   Pancreas: Normal, without mass or ductal dilatation.   Spleen: Normal in size, without focal abnormality.   Adrenals/Urinary Tract: Normal adrenal glands. No renal calculi or hydronephrosis. No hydroureter or ureteric calculi. No bladder calculi. Possible pericystic edema, mild, on 76/2.   Stomach/Bowel: Normal stomach, without wall thickening. Extensive colonic diverticulosis. Possible anorectal wall thickening  and surrounding edema on 83/2. Colonic stool burden suggests constipation. Normal  terminal ileum. Appendix is prominent, including at 7 mm on 61/2. No convincing evidence of surrounding edema. Normal small bowel.   Vascular/Lymphatic: Aortic atherosclerosis. No abdominopelvic adenopathy.   Reproductive: Hysterectomy.  No adnexal mass.   Other: No significant free fluid. No evidence of omental or peritoneal disease.   Musculoskeletal: Lytic lesion in the posterior right iliac is similar and may be a degenerative cyst. L2-3 fusion could be developmental or postsurgical.   IMPRESSION: 1. No urinary tract calculi or hydronephrosis. Otherwise, low sensitivity exam secondary to lack of oral or IV contrast. 2. Possible perirectal edema.  Correlate with symptoms of proctitis. 3. The appendix is mildly prominent, but no surrounding inflammation is seen. If appendicitis is a clinical concern, consider dedicated contrast enhanced exam. 4. Equivocal bladder wall thickening and surrounding edema as can be seen with cystitis. 5. Coronary artery atherosclerosis. Aortic Atherosclerosis (ICD10-I70.0). 6.  Possible constipation.     Electronically Signed   By: Jeronimo Greaves M.D.   On: 06/20/2022 13:42 I have independently reviewed the films.    Assessment & Plan:    1. Suspected UTI -UA grossly infected -urine culture is pending -We will wait on culture results prior to prescribing antibiotic and then we will leave her on antibiotic for 10 days -Once she completes the antibiotic for 10 days, we will start on a low-dose prophylactic antibiotic for 3 months   2.  Vaginal yeast infection -We did discuss that with her lack of estrogen, she is likely having issues with vaginal atrophy and unfortunately due to her recent diagnosis of breast cancer she is not a candidate for vaginal estrogen cream -We will go ahead and start her on a vaginal yeast treatment along with Diflucan 100 mg daily for  7 days                                              Return in about 3 months (around 05/29/2023) for OAB, PVR .  These notes generated with voice recognition software. I apologize for typographical errors.  Cloretta Ned  [facility] [address]  [address] [place], [place] [address] [phone]

## 2023-02-27 ENCOUNTER — Ambulatory Visit: Payer: Medicare Other | Admitting: Dermatology

## 2023-02-27 ENCOUNTER — Ambulatory Visit: Payer: Medicare HMO | Admitting: Urology

## 2023-02-27 ENCOUNTER — Encounter: Payer: Self-pay | Admitting: Urology

## 2023-02-27 VITALS — BP 102/63 | HR 71 | Ht 66.0 in | Wt 182.0 lb

## 2023-02-27 DIAGNOSIS — R39198 Other difficulties with micturition: Secondary | ICD-10-CM | POA: Diagnosis not present

## 2023-02-27 DIAGNOSIS — Z87440 Personal history of urinary (tract) infections: Secondary | ICD-10-CM

## 2023-02-27 DIAGNOSIS — B3731 Acute candidiasis of vulva and vagina: Secondary | ICD-10-CM | POA: Diagnosis not present

## 2023-02-27 DIAGNOSIS — N390 Urinary tract infection, site not specified: Secondary | ICD-10-CM

## 2023-02-28 ENCOUNTER — Telehealth: Payer: Self-pay | Admitting: Family Medicine

## 2023-02-28 LAB — URINE CULTURE: Culture: 20000 — AB

## 2023-02-28 MED ORDER — TRIMETHOPRIM 100 MG PO TABS: 100.0000 mg | ORAL_TABLET | Freq: Every day | ORAL | 0 refills | Status: DC

## 2023-02-28 MED ORDER — SULFAMETHOXAZOLE-TRIMETHOPRIM 800-160 MG PO TABS: 1.0000 | ORAL_TABLET | Freq: Two times a day (BID) | ORAL | 0 refills | Status: DC

## 2023-02-28 MED ORDER — FLUCONAZOLE 100 MG PO TABS: 100.0000 mg | ORAL_TABLET | Freq: Every day | ORAL | 0 refills | Status: DC

## 2023-02-28 NOTE — Telephone Encounter (Signed)
-----   Message from Harle Battiest, PA-C sent at 02/28/2023 11:01 AM EDT ----- Please let Mrs. Partridge know that her final urine culture results are available.  We need to start Septra DS, twice daily for ten days and then trimethoprim daily for 90 days.  I will also send in Diflucan 100 mg once daily for seven days. Will she need for me to send in a script for the topical vaginal yeast infection as well?

## 2023-02-28 NOTE — Telephone Encounter (Signed)
Patient notified and voiced understanding. She would like the topical sent to the pharmacy also. I have sent all the other medications.

## 2023-03-01 ENCOUNTER — Other Ambulatory Visit: Payer: Self-pay | Admitting: Urology

## 2023-03-01 DIAGNOSIS — B3731 Acute candidiasis of vulva and vagina: Secondary | ICD-10-CM

## 2023-03-01 MED ORDER — TERCONAZOLE 0.4 % VA CREA: 1.0000 | TOPICAL_CREAM | Freq: Every day | VAGINAL | 0 refills | Status: DC

## 2023-03-04 ENCOUNTER — Inpatient Hospital Stay: Admit: 2023-03-04 | Payer: Medicare HMO

## 2023-03-05 ENCOUNTER — Encounter: Payer: Self-pay | Admitting: Urology

## 2023-03-05 ENCOUNTER — Other Ambulatory Visit: Payer: Self-pay | Admitting: Urology

## 2023-03-05 DIAGNOSIS — N390 Urinary tract infection, site not specified: Secondary | ICD-10-CM

## 2023-03-05 MED ORDER — CEFUROXIME AXETIL 500 MG PO TABS: 500.0000 mg | ORAL_TABLET | Freq: Two times a day (BID) | ORAL | 0 refills | Status: DC

## 2023-03-14 ENCOUNTER — Other Ambulatory Visit: Payer: Self-pay | Admitting: Family Medicine

## 2023-03-14 MED ORDER — CEPHALEXIN 250 MG PO CAPS: 250.0000 mg | ORAL_CAPSULE | Freq: Four times a day (QID) | ORAL | 0 refills | Status: DC

## 2023-03-15 ENCOUNTER — Other Ambulatory Visit: Payer: Self-pay | Admitting: Hematology and Oncology

## 2023-03-25 ENCOUNTER — Telehealth: Payer: Self-pay

## 2023-03-25 ENCOUNTER — Ambulatory Visit (HOSPITAL_COMMUNITY): Payer: Medicare HMO

## 2023-03-25 NOTE — Telephone Encounter (Signed)
Pt called to let us know her MRI was r/s d/t insurance pending. Message sent to PA team to obtain PA. She is currently scheduled at [facility] for 04/01/23. We explained to her why bilateral MRI is necessary for surveillance of chest wall. She verbalized understanding and was thankful for this. She knows our office will be in touch should there be a problem with PA.

## 2023-03-27 ENCOUNTER — Ambulatory Visit: Payer: Medicare HMO | Admitting: Dermatology

## 2023-03-27 VITALS — BP 128/70 | HR 75

## 2023-03-27 DIAGNOSIS — L820 Inflamed seborrheic keratosis: Secondary | ICD-10-CM

## 2023-03-27 DIAGNOSIS — R21 Rash and other nonspecific skin eruption: Secondary | ICD-10-CM

## 2023-03-27 DIAGNOSIS — W908XXA Exposure to other nonionizing radiation, initial encounter: Secondary | ICD-10-CM

## 2023-03-27 DIAGNOSIS — L570 Actinic keratosis: Secondary | ICD-10-CM | POA: Diagnosis not present

## 2023-03-27 DIAGNOSIS — D485 Neoplasm of uncertain behavior of skin: Secondary | ICD-10-CM

## 2023-03-27 DIAGNOSIS — L821 Other seborrheic keratosis: Secondary | ICD-10-CM

## 2023-03-27 DIAGNOSIS — X32XXXA Exposure to sunlight, initial encounter: Secondary | ICD-10-CM

## 2023-03-27 DIAGNOSIS — L578 Other skin changes due to chronic exposure to nonionizing radiation: Secondary | ICD-10-CM

## 2023-03-27 NOTE — Patient Instructions (Addendum)
Wound Care Instructions  Cleanse wound gently with soap and water once a day then pat dry with clean gauze. Apply a thin coat of Petrolatum (petroleum jelly, "Vaseline") over the wound (unless you have an allergy to this). We recommend that you use a new, sterile tube of Vaseline. Do not pick or remove scabs. Do not remove the yellow or white "healing tissue" from the base of the wound.  Cover the wound with fresh, clean, nonstick gauze and secure with paper tape. You may use Band-Aids in place of gauze and tape if the wound is small enough, but would recommend trimming much of the tape off as there is often too much. Sometimes Band-Aids can irritate the skin.  You should call the office for your biopsy report after 1 week if you have not already been contacted.  If you experience any problems, such as abnormal amounts of bleeding, swelling, significant bruising, significant pain, or evidence of infection, please call the office immediately.  FOR ADULT SURGERY PATIENTS: If you need something for pain relief you may take 1 extra strength Tylenol (acetaminophen) AND 2 Ibuprofen (200mg each) together every 4 hours as needed for pain. (do not take these if you are allergic to them or if you have a reason you should not take them.) Typically, you may only need pain medication for 1 to 3 days.     Due to recent changes in healthcare laws, you may see results of your pathology and/or laboratory studies on MyChart before the doctors have had a chance to review them. We understand that in some cases there may be results that are confusing or concerning to you. Please understand that not all results are received at the same time and often the doctors may need to interpret multiple results in order to provide you with the best plan of care or course of treatment. Therefore, we ask that you please give us 2 business days to thoroughly review all your results before contacting the office for clarification. Should  we see a critical lab result, you will be contacted sooner.   If You Need Anything After Your Visit  If you have any questions or concerns for your doctor, please call our main line at [phone] and press option 4 to reach your doctor's medical assistant. If no one answers, please leave a voicemail as directed and we will return your call as soon as possible. Messages left after 4 pm will be answered the following business day.   You may also send us a message via MyChart. We typically respond to MyChart messages within 1-2 business days.  For prescription refills, please ask your pharmacy to contact our office. Our fax number is [phone].  If you have an urgent issue when the clinic is closed that cannot wait until the next business day, you can page your doctor at the number below.    Please note that while we do our best to be available for urgent issues outside of office hours, we are not available 24/7.   If you have an urgent issue and are unable to reach us, you may choose to seek medical care at your doctor's office, retail clinic, urgent care center, or emergency room.  If you have a medical emergency, please immediately call 911 or go to the emergency department.  Pager Numbers  - Dr. Kowalski: [phone]  - Dr. Moye: [phone]  - Dr. Stewart: [phone]  In the event of inclement weather, please call our main line at   [phone] for an update on the status of any delays or closures.  Dermatology Medication Tips: Please keep the boxes that topical medications come in in order to help keep track of the instructions about where and how to use these. Pharmacies typically print the medication instructions only on the boxes and not directly on the medication tubes.   If your medication is too expensive, please contact our office at [phone] option 4 or send us a message through MyChart.   We are unable to tell what your co-pay for medications will be in  advance as this is different depending on your insurance coverage. However, we may be able to find a substitute medication at lower cost or fill out paperwork to get insurance to cover a needed medication.   If a prior authorization is required to get your medication covered by your insurance company, please allow us 1-2 business days to complete this process.  Drug prices often vary depending on where the prescription is filled and some pharmacies may offer cheaper prices.  The website www.goodrx.com contains coupons for medications through different pharmacies. The prices here do not account for what the cost may be with help from insurance (it may be cheaper with your insurance), but the website can give you the price if you did not use any insurance.  - You can print the associated coupon and take it with your prescription to the pharmacy.  - You may also stop by our office during regular business hours and pick up a GoodRx coupon card.  - If you need your prescription sent electronically to a different pharmacy, notify our office through [facility] MyChart or by phone at [phone] option 4.     Si Usted Necesita Algo Despus de Su Visita  Tambin puede enviarnos un mensaje a travs de MyChart. Por lo general respondemos a los mensajes de MyChart en el transcurso de 1 a 2 das hbiles.  Para renovar recetas, por favor pida a su farmacia que se ponga en contacto con nuestra oficina. Nuestro nmero de fax es el [phone].  Si tiene un asunto urgente cuando la clnica est cerrada y que no puede esperar hasta el siguiente da hbil, puede llamar/localizar a su doctor(a) al nmero que aparece a continuacin.   Por favor, tenga en cuenta que aunque hacemos todo lo posible para estar disponibles para asuntos urgentes fuera del horario de oficina, no estamos disponibles las 24 horas del da, los 7 das de la semana.   Si tiene un problema urgente y no puede comunicarse con nosotros, puede  optar por buscar atencin mdica  en el consultorio de su doctor(a), en una clnica privada, en un centro de atencin urgente o en una sala de emergencias.  Si tiene una emergencia mdica, por favor llame inmediatamente al 911 o vaya a la sala de emergencias.  Nmeros de bper  - Dr. Kowalski: [phone]  - Dra. Moye: [phone]  - Dra. Stewart: [phone]  En caso de inclemencias del tiempo, por favor llame a nuestra lnea principal al [phone] para una actualizacin sobre el estado de cualquier retraso o cierre.  Consejos para la medicacin en dermatologa: Por favor, guarde las cajas en las que vienen los medicamentos de uso tpico para ayudarle a seguir las instrucciones sobre dnde y cmo usarlos. Las farmacias generalmente imprimen las instrucciones del medicamento slo en las cajas y no directamente en los tubos del medicamento.   Si su medicamento es muy caro, por favor, pngase en contacto con   nuestra oficina llamando al [phone] y presione la opcin 4 o envenos un mensaje a travs de MyChart.   No podemos decirle cul ser su copago por los medicamentos por adelantado ya que esto es diferente dependiendo de la cobertura de su seguro. Sin embargo, es posible que podamos encontrar un medicamento sustituto a menor costo o llenar un formulario para que el seguro cubra el medicamento que se considera necesario.   Si se requiere una autorizacin previa para que su compaa de seguros cubra su medicamento, por favor permtanos de 1 a 2 das hbiles para completar este proceso.  Los precios de los medicamentos varan con frecuencia dependiendo del lugar de dnde se surte la receta y alguna farmacias pueden ofrecer precios ms baratos.  El sitio web www.goodrx.com tiene cupones para medicamentos de diferentes farmacias. Los precios aqu no tienen en cuenta lo que podra costar con la ayuda del seguro (puede ser ms barato con su seguro), pero el sitio web puede darle el  precio si no utiliz ningn seguro.  - Puede imprimir el cupn correspondiente y llevarlo con su receta a la farmacia.  - Tambin puede pasar por nuestra oficina durante el horario de atencin regular y recoger una tarjeta de cupones de GoodRx.  - Si necesita que su receta se enve electrnicamente a una farmacia diferente, informe a nuestra oficina a travs de MyChart de [facility] o por telfono llamando al [phone] y presione la opcin 4.  

## 2023-03-27 NOTE — Progress Notes (Unsigned)
Follow-Up Visit   Subjective  Katelyn Ball is a 77 y.o. female who presents for the following: Actinic keratosis - recheck previously treated precancerous skin lesions of the L upper arm, R upper arm x 3, L elbow x 2, and L forearm x 4  The following portions of the chart were reviewed this encounter and updated as appropriate: medications, allergies, medical history  Review of Systems:  No other skin or systemic complaints except as noted in HPI or Assessment and Plan.  Objective  Well appearing patient in no apparent distress; mood and affect are within normal limits.  A focused examination was performed of the following areas: The face, arms, and hands  Relevant exam findings are noted in the Assessment and Plan.  R pretibia (hypertrophic) x 1, L antecubital fossa x 1, L forearm x 1, R post calf x 1 (4) Erythematous thin papules/macules with gritty scale.   right mid-back under bra, right posterior calf (2) Erythematous stuck-on, waxy papule or plaque  R post calf 0.8 cm brown and pink papule.       Assessment & Plan   Rash and other nonspecific skin eruption  AK (actinic keratosis) (4) R pretibia (hypertrophic) x 1, L antecubital fossa x 1, L forearm x 1, R post calf x 1  Actinic keratoses are precancerous spots that appear secondary to cumulative UV radiation exposure/sun exposure over time. They are chronic with expected duration over 1 year. A portion of actinic keratoses will progress to squamous cell carcinoma of the skin. It is not possible to reliably predict which spots will progress to skin cancer and so treatment is recommended to prevent development of skin cancer.  Recommend daily broad spectrum sunscreen SPF 30+ to sun-exposed areas, reapply every 2 hours as needed.  Recommend staying in the shade or wearing long sleeves, sun glasses (UVA+UVB protection) and wide brim hats (4-inch brim around the entire circumference of the hat). Call for new or  changing lesions.  Destruction of lesion - R pretibia (hypertrophic) x 1, L antecubital fossa x 1, L forearm x 1, R post calf x 1 Complexity: simple   Destruction method: cryotherapy   Informed consent: discussed and consent obtained   Timeout:  patient name, date of birth, surgical site, and procedure verified Lesion destroyed using liquid nitrogen: Yes   Region frozen until ice ball extended beyond lesion: Yes   Outcome: patient tolerated procedure well with no complications   Post-procedure details: wound care instructions given   Additional details:  Prior to procedure, discussed risks of blister formation, small wound, skin dyspigmentation, or rare scar following cryotherapy. Recommend Vaseline ointment to treated areas while healing.   Inflamed seborrheic keratosis (2) right mid-back under bra, right posterior calf  Symptomatic, irritating, patient would like treated.  Benign-appearing.  Call clinic for new or changing lesions.   Prior to procedure, discussed risks of blister formation, small wound, skin dyspigmentation, or rare scar following treatment. Recommend Vaseline ointment to treated areas while healing.   Destruction of lesion - right mid-back under bra, right posterior calf Complexity: simple   Destruction method: cryotherapy   Informed consent: discussed and consent obtained   Timeout:  patient name, date of birth, surgical site, and procedure verified Lesion destroyed using liquid nitrogen: Yes   Region frozen until ice ball extended beyond lesion: Yes   Outcome: patient tolerated procedure well with no complications   Post-procedure details: wound care instructions given    Neoplasm of uncertain behavior of  skin R post calf  Skin / nail biopsy Type of biopsy: tangential   Informed consent: discussed and consent obtained   Timeout: patient name, date of birth, surgical site, and procedure verified   Procedure prep:  Patient was prepped and draped in usual  sterile fashion Prep type:  Isopropyl alcohol Anesthesia: the lesion was anesthetized in a standard fashion   Anesthetic:  1% lidocaine w/ epinephrine 1-100,000 buffered w/ 8.4% NaHCO3 Instrument used: flexible razor blade   Hemostasis achieved with: pressure, aluminum chloride and electrodesiccation   Outcome: patient tolerated procedure well   Post-procedure details: sterile dressing applied and wound care instructions given   Dressing type: bandage and petrolatum    Specimen 1 - Surgical pathology Differential Diagnosis: r/o atypia D48.5 Check Margins: No  R/o atypia   Actinic skin damage   ACTINIC DAMAGE - chronic, secondary to cumulative UV radiation exposure/sun exposure over time - diffuse scaly erythematous macules with underlying dyspigmentation - Recommend daily broad spectrum sunscreen SPF 30+ to sun-exposed areas, reapply every 2 hours as needed.  - Recommend staying in the shade or wearing long sleeves, sun glasses (UVA+UVB protection) and wide brim hats (4-inch brim around the entire circumference of the hat). - Call for new or changing lesions.  SEBORRHEIC KERATOSIS - Stuck-on, waxy, tan-brown papules and/or plaques  - Benign-appearing - Discussed benign etiology and prognosis. - Observe - Call for any changes  Rash Exam: B/L arms and legs with scaly pink papules  Differential diagnosis: favor hypersensitivity to cephalexin given timeline appearing 4-5 days after starting cephalexin. No history of rash in early summer to suggest PMLE  Treatment Plan: Start Clobetasol or TMC 0.1% ointment to aa's BID up to two weeks. Topical steroids (such as triamcinolone, fluocinolone, fluocinonide, mometasone, clobetasol, halobetasol, betamethasone, hydrocortisone) can cause thinning and lightening of the skin if they are used for too long in the same area. Your physician has selected the right strength medicine for your problem and area affected on the body. Please use your  medication only as directed by your physician to prevent side effects. Patient thinks she has TMC at home. May use Mometasone if she has that and not the [facility].    Return in about 3 months (around 06/27/2023) for TBSE with Dr. Katrinka Blazing.  Maylene Roes, CMA, am acting as scribe for Darden Dates, MD .  Documentation: I have reviewed the above documentation for accuracy and completeness, and I agree with the above.  Darden Dates, MD

## 2023-04-01 ENCOUNTER — Ambulatory Visit: Payer: Medicare HMO | Attending: General Surgery

## 2023-04-01 ENCOUNTER — Ambulatory Visit (HOSPITAL_COMMUNITY)
Admission: RE | Admit: 2023-04-01 | Discharge: 2023-04-01 | Disposition: A | Discharge status: Home / Self Care | Payer: Medicare HMO | Source: Ambulatory Visit | Attending: Adult Health | Admitting: Adult Health

## 2023-04-01 VITALS — Wt 184.4 lb

## 2023-04-01 DIAGNOSIS — Z483 Aftercare following surgery for neoplasm: Secondary | ICD-10-CM | POA: Insufficient documentation

## 2023-04-01 DIAGNOSIS — C50411 Malignant neoplasm of upper-outer quadrant of right female breast: Secondary | ICD-10-CM | POA: Diagnosis present

## 2023-04-01 DIAGNOSIS — Z170 Estrogen receptor positive status [ER+]: Secondary | ICD-10-CM | POA: Diagnosis present

## 2023-04-01 MED ORDER — GADOBUTROL 1 MMOL/ML IV SOLN
8.3000 mL | Freq: Once | INTRAVENOUS | Status: AC | PRN
  Administered 2023-04-01: 8.5 mL via INTRAVENOUS

## 2023-04-01 NOTE — Progress Notes (Unsigned)
HEMATOLOGY-ONCOLOGY TELEPHONE VISIT PROGRESS NOTE  I connected with our patient on 04/02/23 at 10:30 AM EDT by telephone and verified that I am speaking with the correct person using two identifiers.  I discussed the limitations, risks, security and privacy concerns of performing an evaluation and management service by telephone and the availability of in person appointments.  I also discussed with the patient that there may be a patient responsible charge related to this service. The patient expressed understanding and agreed to proceed.   History of Present Illness:  Katelyn Ball is a 77 y.o. female is here because of recent diagnosis of right breast cancer. She presents to the clinic today for a telephone follow-up.  She is tolerating and also extremely well without any major problems.  She had only 1 episode of hot flashes in the last month.  Oncology History  Malignant neoplasm of upper-outer quadrant of right breast in female, estrogen receptor positive (HCC)  03/26/2022 Initial Diagnosis   Screening detected right breast distortion at 12:00: 1.1 cm, axilla negative, ultrasound biopsy: Grade 1 ILC, ER greater than 90%, PR greater than 90%, HER2 negative, Ki-67 not done   04/04/2022 Cancer Staging   Staging form: Breast, AJCC 8th Edition - Clinical: Stage IA (cT1c, cN0, cM0, G1, ER+, PR+, HER2-) - Signed by Serena Croissant, MD on 04/04/2022 Stage prefix: Initial diagnosis Histologic grading system: 3 grade system    Genetic Testing   [facility] CustomNext Panel was Negative. Report date is 04/19/2022.  The CustomNext-Cancer+RNAinsight panel offered by [employer] includes sequencing and rearrangement analysis for the following 47 genes:  APC, ATM, AXIN2, BARD1, BMPR1A, BRCA1, BRCA2, BRIP1, CDH1, CDK4, CDKN2A, CHEK2, CTNNA1, DICER1, EPCAM, GREM1, HOXB13, KIT, MEN1, MLH1, MSH2, MSH3, MSH6, MUTYH, NBN, NF1, NTHL1, PALB2, PDGFRA, PMS2, POLD1, POLE, PTEN, RAD50, RAD51C, RAD51D, SDHA, SDHB, SDHC,  SDHD, SMAD4, SMARCA4, STK11, TP53, TSC1, TSC2, and VHL.  RNA data is routinely analyzed for use in variant interpretation for all genes.   04/23/2022 Surgery   Right Mastectomy: Grade 2 ILC 2.1 cm, LG DCIS, ALH, margins Neg, Benign IM LN, 0/5 LN Neg, ER > 90%, PR > 90%, HER2 negative, Ki-67 not done   06/02/2022 -  Anti-estrogen oral therapy   Letrozole 2.5 mg daily x7 years      REVIEW OF SYSTEMS:   Constitutional: Denies fevers, chills or abnormal weight loss All other systems were reviewed with the patient and are negative. Observations/Objective:     Assessment Plan:  Malignant neoplasm of upper-outer quadrant of right breast in female, estrogen receptor positive (HCC) 04/23/22: Right Mastectomy: Grade 2 ILC 2.1 cm, LG DCIS, ALH, margins Neg, Benign IM LN, 0/5 LN Neg, ER > 90%, PR > 90%, HER2 negative, Ki-67 not done   Treatment Plan:  Adjuvant antiestrogen therapy with letrozole 2.5 mg daily x7 years started 06/02/2022   History of profound hot flashes: She had these hot flashes even before starting antiestrogen therapy.  She could not tolerate Effexor or Paxil. Letrozole was discontinued due to weight gain and hot flashes  Anastrozole toxicities: (started 03/05/2023) Tolerating it extremely well without any major side effects.  Only 1 hot flash in the last month.    Breast cancer surveillance:  Mammogram will need to be ordered on the left breast Breast chest wall exam 02/19/2023: Benign Breast MRI 04/01/2023: Indeterminate left breast mass 0.8 cm MRI guided biopsy recommended.  Will arrange for that.   She has an appointment September to see Mardella Layman.  Will keep that  appointment.    I discussed the assessment and treatment plan with the patient. The patient was provided an opportunity to ask questions and all were answered. The patient agreed with the plan and demonstrated an understanding of the instructions. The patient was advised to call back or seek an in-person evaluation if  the symptoms worsen or if the condition fails to improve as anticipated.   I provided 12 minutes of non-face-to-face time during this encounter.  This includes time for charting and coordination of care   Tamsen Meek, MD  I Janan Ridge am acting as a scribe for Dr.Vinay Gudena  I have reviewed the above documentation for accuracy and completeness, and I agree with the above.

## 2023-04-01 NOTE — Therapy (Signed)
OUTPATIENT PHYSICAL THERAPY SOZO SCREENING NOTE   Patient Name: Katelyn Ball MRN: [id number] DOB:December 17, 1945, 77 y.o., female Today's Date: 04/01/2023  PCP: [name], MD REFERRING PROVIDER: Griselda Miner, MD   PT End of Session - 04/01/23 1031     Visit Number 1   # unchanged due to screen only   PT Start Time 1029    PT Stop Time 1034    PT Time Calculation (min) 5 min    Activity Tolerance Patient tolerated treatment well    Behavior During Therapy WFL for tasks assessed/performed             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   right upper arm, ATYPICAL SQUAMOUS PROLIFERATION, 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, re-biopsied and treated with [facility] 11/15/21   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   left posterior calf inferior to popliteal fossa   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   left upper cutaneous lip   Vitamin D deficiency    Past Surgical History:  Procedure Laterality Date   ABDOMINAL HYSTERECTOMY     BACK SURGERY     BREAST BIOPSY Right 03/26/2022   u/s bx rt 12:00 5cmfn venus clip path pending   BREAST CYST ASPIRATION Left    BREAST CYST ASPIRATION Right 09/25/2017   2 FNA's done on cysts   CHOLECYSTECTOMY     COLONOSCOPY WITH PROPOFOL N/A 10/05/2015   Procedure: COLONOSCOPY WITH PROPOFOL;  Surgeon: Scot Jun, MD;  Location: [facility] ENDOSCOPY;  Service: Endoscopy;  Laterality: N/A;    ESOPHAGOGASTRODUODENOSCOPY (EGD) WITH PROPOFOL N/A 10/05/2015   Procedure: ESOPHAGOGASTRODUODENOSCOPY (EGD) WITH PROPOFOL;  Surgeon: Scot Jun, MD;  Location: [facility] ENDOSCOPY;  Service: Endoscopy;  Laterality: N/A;   JOINT REPLACEMENT Bilateral 11/2012   LAMINECTOMY AND MICRODISCECTOMY LUMBAR SPINE     MASTECTOMY W/ SENTINEL NODE BIOPSY Right 04/23/2022   Procedure: RIGHT MASTECTOMY;  Surgeon: Griselda Miner, MD;  Location: [facility] OR;  Service: General;  Laterality: Right;   PILONIDAL CYST DRAINAGE     SENTINEL NODE BIOPSY Right 04/23/2022   Procedure: RIGHT SENTINEL NODE BIOPSY;  Surgeon: Griselda Miner, MD;  Location: [facility] OR;  Service: General;  Laterality: Right;   TONSILLECTOMY     Patient Active Problem List   Diagnosis Date Noted   Cancer of right female breast (HCC) 04/23/2022   Genetic testing 04/20/2022   Malignant neoplasm of upper-outer quadrant of right breast in female, estrogen receptor positive (HCC) 04/04/2022   Prediabetes 03/27/2022   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   Vitamin D deficiency 05/24/2021   Vaginal enterocele 05/24/2021   Colonic polyp 05/24/2021   GERD (gastroesophageal reflux disease) 05/24/2021   Chronic obstructive pulmonary disease, unspecified (HCC) 05/24/2021   Allergic rhinitis due to allergen 05/24/2021   Coronary artery calcification seen  on CT scan 12/18/2017   Aortic atherosclerosis (HCC) 12/18/2017   Recurrent major depressive disorder, in full remission (HCC) 10/18/2016   Diverticulosis of large intestine without hemorrhage 10/18/2015   Trochanteric bursitis of right hip 01/25/2015   Lumbar radiculitis 01/25/2015   Pure hypercholesterolemia 11/02/2014   Essential hypertension 11/02/2014   B12 deficiency 09/15/2014    REFERRING DIAG: right breast cancer at risk for lymphedema  THERAPY DIAG:  Aftercare following surgery for neoplasm  PERTINENT HISTORY: Patient was diagnosed on 02/19/2022 with 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 She underwent a right mastectomy and sentinel node biopsy (5 negative nodes removed) on 04/23/2022. It is ER/PR positive and HER2 negative with an unknown Ki67.  PRECAUTIONS: right UE Lymphedema risk, None  SUBJECTIVE: Pt returns for her 3 month L-Dex screen.   PAIN:  Are you having pain? No  SOZO SCREENING: Patient was assessed today using the SOZO machine to determine the lymphedema index score. This was compared to her baseline score. It was determined that she is within the recommended range when compared to her baseline and no further action is needed at this time. She will continue SOZO screenings. These are done every 3 months for 2 years post operatively followed by every 6 months for 2 years, and then annually.   L-DEX FLOWSHEETS - 04/01/23 1000       L-DEX LYMPHEDEMA SCREENING   Measurement Type Unilateral    L-DEX MEASUREMENT EXTREMITY Upper Extremity    POSITION  Standing    DOMINANT SIDE Right    At Risk Side Right    BASELINE SCORE (UNILATERAL) -0.3    L-DEX SCORE (UNILATERAL) 4.4    VALUE CHANGE (UNILAT) 4.7              Hermenia Bers, PTA 04/01/2023, 10:36 AM

## 2023-04-02 ENCOUNTER — Inpatient Hospital Stay: Payer: Medicare HMO | Attending: Hematology and Oncology | Admitting: Hematology and Oncology

## 2023-04-02 ENCOUNTER — Other Ambulatory Visit: Payer: Self-pay | Admitting: *Deleted

## 2023-04-02 ENCOUNTER — Other Ambulatory Visit: Payer: Self-pay | Admitting: Hematology and Oncology

## 2023-04-02 DIAGNOSIS — N6320 Unspecified lump in the left breast, unspecified quadrant: Secondary | ICD-10-CM

## 2023-04-02 DIAGNOSIS — C50411 Malignant neoplasm of upper-outer quadrant of right female breast: Secondary | ICD-10-CM | POA: Diagnosis not present

## 2023-04-02 DIAGNOSIS — Z170 Estrogen receptor positive status [ER+]: Secondary | ICD-10-CM

## 2023-04-02 DIAGNOSIS — R9389 Abnormal findings on diagnostic imaging of other specified body structures: Secondary | ICD-10-CM

## 2023-04-02 DIAGNOSIS — R928 Other abnormal and inconclusive findings on diagnostic imaging of breast: Secondary | ICD-10-CM

## 2023-04-02 NOTE — Assessment & Plan Note (Signed)
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  She could not tolerate Effexor or Paxil.   Anastrozole toxicities: Letrozole was discontinued due to weight gain and hot flashes    Breast cancer surveillance:  Mammogram will need to be ordered on the left breast Breast chest wall exam 02/19/2023: Benign Breast MRI 04/01/2023:   Telephone visit in 6 weeks to discuss tolerance to anastrozole therapy.

## 2023-04-04 ENCOUNTER — Ambulatory Visit
Admission: RE | Admit: 2023-04-04 | Discharge: 2023-04-04 | Disposition: A | Discharge status: Home / Self Care | Payer: Medicare HMO | Source: Ambulatory Visit | Attending: Hematology and Oncology | Admitting: Hematology and Oncology

## 2023-04-04 ENCOUNTER — Telehealth: Payer: Self-pay

## 2023-04-04 DIAGNOSIS — R928 Other abnormal and inconclusive findings on diagnostic imaging of breast: Secondary | ICD-10-CM

## 2023-04-04 DIAGNOSIS — R9389 Abnormal findings on diagnostic imaging of other specified body structures: Secondary | ICD-10-CM

## 2023-04-04 DIAGNOSIS — N6320 Unspecified lump in the left breast, unspecified quadrant: Secondary | ICD-10-CM

## 2023-04-04 DIAGNOSIS — Z170 Estrogen receptor positive status [ER+]: Secondary | ICD-10-CM

## 2023-04-04 NOTE — Telephone Encounter (Signed)
-----   Message from Sandi Mealy, MD sent at 04/04/2023 10:20 AM EDT ----- Skin , right post calf ACTINIC KERATOSIS AND SEBORRHEIC KERATOSIS, INFLAMED --> LN2 at her f/u in August  MAs please call. Thank you!

## 2023-04-04 NOTE — Telephone Encounter (Signed)
Discussed pathology results. Patient voiced understanding. Will Tx with LN2 at follow up appointment.

## 2023-04-16 ENCOUNTER — Encounter: Payer: Self-pay | Admitting: *Deleted

## 2023-04-19 ENCOUNTER — Encounter: Payer: Self-pay | Admitting: *Deleted

## 2023-04-22 ENCOUNTER — Ambulatory Visit: Payer: Medicare HMO | Admitting: Anesthesiology

## 2023-04-22 ENCOUNTER — Encounter
Admission: RE | Disposition: A | Discharge status: Home / Self Care | Payer: Self-pay | Source: Home / Self Care | Attending: Gastroenterology

## 2023-04-22 ENCOUNTER — Ambulatory Visit
Admission: RE | Admit: 2023-04-22 | Discharge: 2023-04-22 | Disposition: A | Discharge status: Home / Self Care | Payer: Medicare HMO | Attending: Gastroenterology | Admitting: Gastroenterology

## 2023-04-22 DIAGNOSIS — Z87891 Personal history of nicotine dependence: Secondary | ICD-10-CM | POA: Diagnosis not present

## 2023-04-22 DIAGNOSIS — Z853 Personal history of malignant neoplasm of breast: Secondary | ICD-10-CM | POA: Insufficient documentation

## 2023-04-22 DIAGNOSIS — F419 Anxiety disorder, unspecified: Secondary | ICD-10-CM | POA: Diagnosis not present

## 2023-04-22 DIAGNOSIS — I10 Essential (primary) hypertension: Secondary | ICD-10-CM | POA: Insufficient documentation

## 2023-04-22 DIAGNOSIS — K529 Noninfective gastroenteritis and colitis, unspecified: Secondary | ICD-10-CM | POA: Diagnosis present

## 2023-04-22 DIAGNOSIS — K640 First degree hemorrhoids: Secondary | ICD-10-CM | POA: Diagnosis not present

## 2023-04-22 DIAGNOSIS — I2510 Atherosclerotic heart disease of native coronary artery without angina pectoris: Secondary | ICD-10-CM | POA: Insufficient documentation

## 2023-04-22 DIAGNOSIS — Z9049 Acquired absence of other specified parts of digestive tract: Secondary | ICD-10-CM | POA: Diagnosis not present

## 2023-04-22 DIAGNOSIS — K5730 Diverticulosis of large intestine without perforation or abscess without bleeding: Secondary | ICD-10-CM | POA: Diagnosis not present

## 2023-04-22 DIAGNOSIS — Q438 Other specified congenital malformations of intestine: Secondary | ICD-10-CM | POA: Diagnosis not present

## 2023-04-22 DIAGNOSIS — Z800 Family history of malignant neoplasm of digestive organs: Secondary | ICD-10-CM | POA: Insufficient documentation

## 2023-04-22 DIAGNOSIS — E039 Hypothyroidism, unspecified: Secondary | ICD-10-CM | POA: Insufficient documentation

## 2023-04-22 DIAGNOSIS — J449 Chronic obstructive pulmonary disease, unspecified: Secondary | ICD-10-CM | POA: Insufficient documentation

## 2023-04-22 HISTORY — PX: COLONOSCOPY WITH PROPOFOL: SHX5780

## 2023-04-22 SURGERY — COLONOSCOPY WITH PROPOFOL
Anesthesia: General

## 2023-04-22 MED ORDER — SODIUM CHLORIDE 0.9 % IV SOLN: INTRAVENOUS | Status: DC

## 2023-04-22 MED ORDER — LIDOCAINE 2% (20 MG/ML) 5 ML SYRINGE
INTRAMUSCULAR | Status: DC | PRN
  Administered 2023-04-22: 20 mg via INTRAVENOUS

## 2023-04-22 MED ORDER — PROPOFOL 500 MG/50ML IV EMUL
INTRAVENOUS | Status: DC | PRN
  Administered 2023-04-22: 120 ug/kg/min via INTRAVENOUS

## 2023-04-22 MED ORDER — PROPOFOL 10 MG/ML IV BOLUS
INTRAVENOUS | Status: DC | PRN
  Administered 2023-04-22: 70 mg via INTRAVENOUS

## 2023-04-22 NOTE — Interval H&P Note (Signed)
History and Physical Interval Note:  04/22/2023 11:21 AM  Katelyn Ball  has presented today for surgery, with the diagnosis of diarrhea,hx of adenomatous polyp of colon.  The various methods of treatment have been discussed with the patient and family. After consideration of risks, benefits and other options for treatment, the patient has consented to  Procedure(s): COLONOSCOPY WITH PROPOFOL (N/A) as a surgical intervention.  The patient's history has been reviewed, patient examined, no change in status, stable for surgery.  I have reviewed the patient's chart and labs.  Questions were answered to the patient's satisfaction.     Regis Bill  Ok to proceed with colonoscopy

## 2023-04-22 NOTE — Anesthesia Preprocedure Evaluation (Addendum)
Anesthesia Evaluation  Patient identified by MRN, date of birth, ID band Patient awake    Reviewed: Allergy & Precautions, H&P , NPO status , Patient's Chart, lab work & pertinent test results  History of Anesthesia Complications (+) PONV and history of anesthetic complications  Airway Mallampati: II  TM Distance: >3 FB Neck ROM: full    Dental no notable dental hx.    Pulmonary neg shortness of breath, COPD (no inhaler), former smoker   Pulmonary exam normal        Cardiovascular Exercise Tolerance: Good hypertension, + CAD (seen on CT scan)  Normal cardiovascular exam     Neuro/Psych  PSYCHIATRIC DISORDERS Anxiety     negative neurological ROS     GI/Hepatic negative GI ROS, Neg liver ROS,,,  Endo/Other  Hypothyroidism    Renal/GU negative Renal ROS  negative genitourinary   Musculoskeletal  (+) Arthritis ,    Abdominal Normal abdominal exam  (+)   Peds  Hematology negative hematology ROS (+)   Anesthesia Other Findings Past Medical History: No date: Actinic keratosis No date: Anemia No date: Anxiety No date: Asthma No date: Atelectasis No date: B12 deficiency No date: Cancer [facility])     Comment:  skin No date: COPD (chronic obstructive pulmonary disease) (HCC) No date: DDD (degenerative disc disease), cervical No date: Dysrhythmia No date: GERD (gastroesophageal reflux disease) No date: History of chicken pox No date: Hyperlipidemia No date: Hypertension No date: Hypothyroidism No date: Iritis No date: Meniere disease No date: PONV (postoperative nausea and vomiting) No date: Pre-diabetes 09/21/2021: SCC (squamous cell carcinoma)     Comment:  right upper arm, ATYPICAL SQUAMOUS PROLIFERATION,               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,               re-biopsied and treated with [facility] 11/15/21 05/24/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)     Comment:  left posterior calf  inferior to popliteal fossa 09/09/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     Comment:  left upper cutaneous lip No date: Vitamin D deficiency  Past Surgical History: No date: ABDOMINAL HYSTERECTOMY No date: BACK SURGERY 03/26/2022: BREAST BIOPSY; Right     Comment:  u/s bx rt 12:00 5cmfn venus clip path pending No date: BREAST CYST ASPIRATION; Left 09/25/2017: BREAST CYST ASPIRATION; Right     Comment:  2 FNA's done on cysts No date: CHOLECYSTECTOMY 10/05/2015: COLONOSCOPY WITH PROPOFOL; N/A     Comment:  Procedure: COLONOSCOPY WITH PROPOFOL;  Surgeon: Scot Jun, MD;  Location: [facility] ENDOSCOPY;  Service:               Endoscopy;  Laterality: N/A; 10/05/2015: ESOPHAGOGASTRODUODENOSCOPY (EGD) WITH PROPOFOL; N/A     Comment:  Procedure: ESOPHAGOGASTRODUODENOSCOPY (EGD) WITH               PROPOFOL;  Surgeon: Scot Jun, MD;  Location: [facility]              ENDOSCOPY;  Service: Endoscopy;  Laterality: N/A; 11/2012: JOINT REPLACEMENT; Bilateral No date: LAMINECTOMY AND MICRODISCECTOMY LUMBAR SPINE No date: MASTECTOMY 04/23/2022: MASTECTOMY W/ SENTINEL NODE BIOPSY; Right     Comment:  Procedure: RIGHT MASTECTOMY;  Surgeon: Griselda Miner,               MD;  Location: [facility] OR;  Service:  General;  Laterality:               Right; No date: PILONIDAL CYST DRAINAGE 04/23/2022: SENTINEL NODE BIOPSY; Right     Comment:  Procedure: RIGHT SENTINEL NODE BIOPSY;  Surgeon: Griselda Miner, MD;  Location: [facility] OR;  Service: General;                Laterality: Right; No date: TONSILLECTOMY     Reproductive/Obstetrics negative OB ROS                             Anesthesia Physical Anesthesia Plan  ASA: 2  Anesthesia Plan: General   Post-op Pain Management:    Induction: Intravenous  PONV Risk Score and Plan: Propofol infusion and TIVA  Airway Management Planned: Natural Airway  Additional Equipment:   Intra-op  Plan:   Post-operative Plan:   Informed Consent: I have reviewed the patients History and Physical, chart, labs and discussed the procedure including the risks, benefits and alternatives for the proposed anesthesia with the patient or authorized representative who has indicated his/her understanding and acceptance.     Dental Advisory Given  Plan Discussed with: CRNA and Surgeon  Anesthesia Plan Comments:         Anesthesia Quick Evaluation

## 2023-04-22 NOTE — Op Note (Signed)
[facility] Gastroenterology Patient Name: Katelyn Ball Procedure Date: 04/22/2023 11:20 AM MRN: [id number] Account #: [id number] Date of Birth: 1946-05-19 Admit Type: Outpatient Age: 77 Room: [facility] ENDO ROOM 3 Gender: Female Note Status: Supervisor Override Instrument Name: Nelda Marseille [id number] Procedure:             Colonoscopy Indications:           High risk colon cancer surveillance: Personal history                         of colonic polyps, Chronic diarrhea Providers:             Eather Colas MD, MD Referring MD:          Daniel Nones, MD (Referring MD) Medicines:             Monitored Anesthesia Care Complications:         No immediate complications. Estimated blood loss:                         Minimal. Procedure:             Pre-Anesthesia Assessment:                        - Prior to the procedure, a History and Physical was                         performed, and patient medications and allergies were                         reviewed. The patient is competent. The risks and                         benefits of the procedure and the sedation options and                         risks were discussed with the patient. All questions                         were answered and informed consent was obtained.                         Patient identification and proposed procedure were                         verified by the physician, the nurse, the                         anesthesiologist, the anesthetist and the technician                         in the endoscopy suite. Mental Status Examination:                         alert and oriented. Airway Examination: normal                         oropharyngeal airway and neck mobility. Respiratory  Examination: clear to auscultation. CV Examination:                         normal. Prophylactic Antibiotics: The patient does not                         require prophylactic antibiotics. Prior                          Anticoagulants: The patient has taken no anticoagulant                         or antiplatelet agents. ASA Grade Assessment: II - A                         patient with mild systemic disease. After reviewing                         the risks and benefits, the patient was deemed in                         satisfactory condition to undergo the procedure. The                         anesthesia plan was to use monitored anesthesia care                         (MAC). Immediately prior to administration of                         medications, the patient was re-assessed for adequacy                         to receive sedatives. The heart rate, respiratory                         rate, oxygen saturations, blood pressure, adequacy of                         pulmonary ventilation, and response to care were                         monitored throughout the procedure. The physical                         status of the patient was re-assessed after the                         procedure.                        After obtaining informed consent, the colonoscope was                         passed under direct vision. Throughout the procedure,                         the patient's blood pressure, pulse, and oxygen  saturations were monitored continuously. The                         Colonoscope was introduced through the anus and                         advanced to the the terminal ileum. The colonoscopy                         was somewhat difficult due to a redundant colon. The                         patient tolerated the procedure well. The quality of                         the bowel preparation was fair. The terminal ileum,                         ileocecal valve, appendiceal orifice, and rectum were                         photographed. Findings:      The perianal and digital rectal examinations were normal.      The terminal ileum appeared normal.      Normal  mucosa was found in the entire colon. Biopsies for histology were       taken with a cold forceps from the entire colon for evaluation of       microscopic colitis. Estimated blood loss was minimal.      A few small-mouthed diverticula were found in the sigmoid colon.      Internal hemorrhoids were found during retroflexion. The hemorrhoids       were Grade I (internal hemorrhoids that do not prolapse).      The exam was otherwise without abnormality on direct and retroflexion       views. Impression:            - Preparation of the colon was fair.                        - The examined portion of the ileum was normal.                        - Normal mucosa in the entire examined colon. Biopsied.                        - Diverticulosis in the sigmoid colon.                        - Internal hemorrhoids.                        - The examination was otherwise normal on direct and                         retroflexion views. Recommendation:        - Discharge patient to home.                        - Resume previous diet.                        -  Continue present medications.                        - Await pathology results.                        - Repeat colonoscopy in 2-3 years because the bowel                         preparation was suboptimal.                        - Return to referring physician as previously                         scheduled. Procedure Code(s):     --- Professional ---                        [phone], Colonoscopy, flexible; with biopsy, single or                         multiple Diagnosis Code(s):     --- Professional ---                        K64.0, First degree hemorrhoids                        K52.9, Noninfective gastroenteritis and colitis,                         unspecified                        K57.30, Diverticulosis of large intestine without                         perforation or abscess without bleeding CPT copyright 2022 American Medical Association.  All rights reserved. The codes documented in this report are preliminary and upon coder review may  be revised to meet current compliance requirements. Eather Colas MD, MD 04/22/2023 11:55:49 AM Number of Addenda: 0 Note Initiated On: 04/22/2023 11:20 AM Scope Withdrawal Time: 0 hours 10 minutes 53 seconds  Total Procedure Duration: 0 hours 21 minutes 24 seconds  Estimated Blood Loss:  Estimated blood loss was minimal.      [facility]

## 2023-04-22 NOTE — H&P (Signed)
Outpatient short stay form Pre-procedure 04/22/2023  Regis Bill, MD  Primary Physician: [name], MD  Reason for visit:  Chronic diarrhea  History of present illness:    77 y/o lady with history of breast cancer, hypertension, hypothyroidism, COPD, and anxiety here for colonoscopy due to diarrhea. Last colonoscopy in 2021 with a small TVA. No blood thinners except aspirin. History of cholecystectomy. Mother had colon cancer in her 70's.     Current Facility-Administered Medications:    0.9 %  sodium chloride infusion, , Intravenous, Continuous, Yasaman Kolek, Rossie Muskrat, MD, Last Rate: 20 mL/hr at 04/22/23 1102, New Bag at 04/22/23 1102  Medications Prior to Admission  Medication Sig Dispense Refill Last Dose   anastrozole (ARIMIDEX) 1 MG tablet TAKE 1 TABLET BY MOUTH EVERY DAY 90 tablet 1 04/21/2023   clonazePAM (KLONOPIN) 1 MG tablet Take 1 mg by mouth at bedtime.   04/21/2023   fenofibrate 160 MG tablet Take 160 mg by mouth at bedtime.   04/21/2023   gabapentin (NEURONTIN) 400 MG capsule Take 400 mg by mouth at bedtime.   04/21/2023   metoprolol succinate (TOPROL-XL) 25 MG 24 hr tablet Take 37.5 mg by mouth daily.   04/22/2023   pantoprazole (PROTONIX) 40 MG tablet Take 40 mg by mouth daily.   04/21/2023   thyroid (ARMOUR) 30 MG tablet Take 90 mg by mouth daily before breakfast.   04/21/2023   triamterene-hydrochlorothiazide (MAXZIDE-25) 37.5-25 MG tablet Take 1 tablet by mouth daily.   04/21/2023   acidophilus (RISAQUAD) CAPS capsule Take 1 capsule by mouth daily.      BLACK CURRANT SEED OIL PO Take 1 capsule by mouth in the morning and at bedtime.      buPROPion (WELLBUTRIN XL) 150 MG 24 hr tablet Take by mouth.      cephALEXin (KEFLEX) 250 MG capsule Take 1 capsule (250 mg total) by mouth 4 (four) times daily. 90 capsule 0    Cholecalciferol 125 MCG (5000 UT) TABS Take 5,000 Units by mouth daily.      Cyanocobalamin 5000 MCG SUBL Place 5,000 mcg under the tongue daily.       fluconazole (DIFLUCAN) 100 MG tablet Take 1 tablet (100 mg total) by mouth daily. X 7 days 7 tablet 0    gabapentin (NEURONTIN) 100 MG capsule Take 100 mg by mouth at bedtime.      montelukast (SINGULAIR) 10 MG tablet Take 10 mg by mouth in the morning.      rosuvastatin (CRESTOR) 5 MG tablet Take 5 mg by mouth every Saturday.      terconazole (TERAZOL 7) 0.4 % vaginal cream Place 1 applicator vaginally at bedtime. 45 g 0      Allergies  Allergen Reactions   Pneumococcal Vaccine Swelling   Sulfa Antibiotics Diarrhea, Rash and Other (See Comments)    Joint pain    Ciprofloxacin     Unknown    Cyclobenzaprine Nausea Only   Cymbalta [Duloxetine Hcl] Nausea Only   Demerol [Meperidine] Nausea Only   Erythromycin     Jaundice   Fiorinal [Butalbital-Aspirin-Caffeine]    Fish Oil Nausea Only   Motrin [Ibuprofen] Nausea Only   Naproxen Nausea Only   Nitrofurantoin Nausea Only   Other Other (See Comments)    Other reaction(s): Other (See Comments), Severe local reaction, Severe local reaction   Cephalexin Rash   Duloxetine Other (See Comments)     Past Medical History:  Diagnosis Date   Actinic keratosis  Anemia    Anxiety    Asthma    Atelectasis    B12 deficiency    Cancer (HCC)    skin   COPD (chronic obstructive pulmonary disease) (HCC)    DDD (degenerative disc disease), cervical    Dysrhythmia    GERD (gastroesophageal reflux disease)    History of chicken pox    Hyperlipidemia    Hypertension    Hypothyroidism    Iritis    Meniere disease    PONV (postoperative nausea and vomiting)    Pre-diabetes    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   right upper arm, ATYPICAL SQUAMOUS PROLIFERATION, 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, re-biopsied and treated with [facility] 11/15/21   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   left posterior calf inferior to popliteal fossa   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    Physical Exam  Gen: Alert, oriented. Appears stated age.  HEENT: PERRLA. Lungs: No respiratory distress CV: RRR Abd: soft, benign, no masses Ext: No edema    Planned procedures: Proceed with colonoscopy. The patient understands the nature of the planned procedure, indications, risks, alternatives and potential complications including but not limited to bleeding, infection, perforation, damage to internal organs and possible oversedation/side effects from anesthesia. The patient agrees and gives consent to proceed.  Please refer to procedure notes for findings, recommendations and patient disposition/instructions.     Regis Bill, MD [facility] Gastroenterology

## 2023-04-22 NOTE — Anesthesia Postprocedure Evaluation (Signed)
Anesthesia Post Note  Patient: Katelyn Ball  Procedure(s) Performed: COLONOSCOPY WITH PROPOFOL  Patient location during evaluation: PACU Anesthesia Type: General Level of consciousness: awake and alert Pain management: pain level controlled Vital Signs Assessment: post-procedure vital signs reviewed and stable Respiratory status: spontaneous breathing, nonlabored ventilation and respiratory function stable Cardiovascular status: blood pressure returned to baseline and stable Postop Assessment: no apparent nausea or vomiting Anesthetic complications: no   No notable events documented.   Last Vitals:  Vitals:   04/22/23 1040 04/22/23 1153  BP: (!) 134/92 112/62  Pulse: 87   Resp: 16   Temp: (!) 36 C (!) 35.8 C  SpO2: 96%     Last Pain:  Vitals:   04/22/23 1213  TempSrc:   PainSc: 0-No pain                 Foye Deer

## 2023-04-22 NOTE — Transfer of Care (Signed)
Immediate Anesthesia Transfer of Care Note  Patient: Katelyn Ball  Procedure(s) Performed: COLONOSCOPY WITH PROPOFOL  Patient Location: Endoscopy Unit  Anesthesia Type:General  Level of Consciousness: drowsy  Airway & Oxygen Therapy: Patient Spontanous Breathing  Post-op Assessment: Report given to RN and Post -op Vital signs reviewed and stable  Post vital signs: Reviewed  Last Vitals:  Vitals Value Taken Time  BP 112/62 04/22/23 1153  Temp 35.8 C 04/22/23 1153  Pulse 75 04/22/23 1153  Resp 15 04/22/23 1153  SpO2 95 % 04/22/23 1153  Vitals shown include unvalidated device data.  Last Pain:  Vitals:   04/22/23 1153  TempSrc: Temporal  PainSc: Asleep         Complications: No notable events documented.

## 2023-04-23 ENCOUNTER — Encounter: Payer: Self-pay | Admitting: Gastroenterology

## 2023-05-03 ENCOUNTER — Telehealth: Payer: Self-pay | Admitting: Dermatology

## 2023-05-03 NOTE — Telephone Encounter (Signed)
Please send a certified letter. Thank you!     Dear Katelyn Ball,  I hope this letter finds you well.  It has been my sincere pleasure to be a part of your healthcare team. Our records show that you have a precancerous spot called an actinic keratosis that has not yet been treated at the right calf.  Since I will be unable to follow-up with you, I am writing to encourage you to keep your appointment for treatment of the precancer spot and to reach out if this spot changes or grows before your follow-up appointment is scheduled.   A small portion of actinic keratoses will progress to skin cancer.   If you have any concerns or are unable to make your appointment, you can reach [place] by phone at [phone] option 4 or through [employer].   As I am leaving the practice, I regret that I will not be able to provide any future care for you. However, I am confident you are in capable hands with my colleagues and the team at [facility].   Thank you for entrusting me with your skin health. 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 Please do not hesitate to reach out if you have any questions or concerns.  Wishing you continued health and well-being.  Sincerely,  Sandi Mealy, MD, MPH Dermatologist [facility] [phone] option 4

## 2023-05-09 ENCOUNTER — Encounter: Payer: Self-pay | Admitting: Dermatology

## 2023-06-26 ENCOUNTER — Ambulatory Visit (INDEPENDENT_AMBULATORY_CARE_PROVIDER_SITE_OTHER): Payer: Medicare HMO | Admitting: Dermatology

## 2023-06-26 ENCOUNTER — Encounter: Payer: Self-pay | Admitting: Dermatology

## 2023-06-26 DIAGNOSIS — C44729 Squamous cell carcinoma of skin of left lower limb, including hip: Secondary | ICD-10-CM | POA: Diagnosis not present

## 2023-06-26 DIAGNOSIS — L814 Other melanin hyperpigmentation: Secondary | ICD-10-CM

## 2023-06-26 DIAGNOSIS — W908XXA Exposure to other nonionizing radiation, initial encounter: Secondary | ICD-10-CM

## 2023-06-26 DIAGNOSIS — D485 Neoplasm of uncertain behavior of skin: Secondary | ICD-10-CM

## 2023-06-26 DIAGNOSIS — L578 Other skin changes due to chronic exposure to nonionizing radiation: Secondary | ICD-10-CM | POA: Diagnosis not present

## 2023-06-26 DIAGNOSIS — L820 Inflamed seborrheic keratosis: Secondary | ICD-10-CM | POA: Diagnosis not present

## 2023-06-26 DIAGNOSIS — Z1283 Encounter for screening for malignant neoplasm of skin: Secondary | ICD-10-CM

## 2023-06-26 DIAGNOSIS — Z86007 Personal history of in-situ neoplasm of skin: Secondary | ICD-10-CM

## 2023-06-26 DIAGNOSIS — L821 Other seborrheic keratosis: Secondary | ICD-10-CM

## 2023-06-26 DIAGNOSIS — L570 Actinic keratosis: Secondary | ICD-10-CM | POA: Diagnosis not present

## 2023-06-26 DIAGNOSIS — D1801 Hemangioma of skin and subcutaneous tissue: Secondary | ICD-10-CM

## 2023-06-26 DIAGNOSIS — Z85828 Personal history of other malignant neoplasm of skin: Secondary | ICD-10-CM

## 2023-06-26 DIAGNOSIS — C44629 Squamous cell carcinoma of skin of left upper limb, including shoulder: Secondary | ICD-10-CM

## 2023-06-26 NOTE — Progress Notes (Addendum)
Follow-Up Visit   Subjective  Katelyn Ball is a 77 y.o. female who presents for the following: Skin Cancer Screening and Full Body Skin Exam  The patient presents for Total-Body Skin Exam (TBSE) for skin cancer screening and mole check. The patient has spots, moles and lesions to be evaluated, some may be new or changing and the patient may have concern these could be cancer.  Patient with hx of SCC. She does have spots at left lower leg and one at left arm that are sore and sometimes bleed.   The following portions of the chart were reviewed this encounter and updated as appropriate: medications, allergies, medical history  Review of Systems:  No other skin or systemic complaints except as noted in HPI or Assessment and Plan.  Objective  Well appearing patient in no apparent distress; mood and affect are within normal limits.  A full examination was performed including scalp, head, eyes, ears, nose, lips, neck, chest, axillae, abdomen, back, buttocks, bilateral upper extremities, bilateral lower extremities, hands, feet, fingers, toes, fingernails, and toenails. All findings within normal limits unless otherwise noted below.  Facial exam limited by makeup. Nail exam limited by polish.  Relevant physical exam findings are noted in the Assessment and Plan.  left lower distal anterior leg Hyperkeratotic pink papule 9mm  R/o SCC     left lateral mid forearm Hyperkeratotic pink papule 5mm  R/o SCC       Right Upper Arm x 2, left dorsal hand x 2, chest x 1 Erythematous thin papules/macules with gritty scale.   left medial forearm x 1 Erythematous stuck-on, waxy papule or plaque    Assessment & Plan   SKIN CANCER SCREENING PERFORMED TODAY.  ACTINIC DAMAGE - Chronic condition, secondary to cumulative UV/sun exposure - diffuse scaly erythematous macules with underlying dyspigmentation - Recommend daily broad spectrum sunscreen SPF 30+ to sun-exposed areas,  reapply every 2 hours as needed.  - Staying in the shade or wearing long sleeves, sun glasses (UVA+UVB protection) and wide brim hats (4-inch brim around the entire circumference of the hat) are also recommended for sun protection.  - Call for new or changing lesions.  LENTIGINES, SEBORRHEIC KERATOSES, HEMANGIOMAS - Benign normal skin lesions - Benign-appearing - Call for any changes  MELANOCYTIC NEVI - Tan-brown and/or pink-flesh-colored symmetric macules and papules - Benign appearing on exam today - Observation - Call clinic for new or changing moles - Recommend daily use of broad spectrum spf 30+ sunscreen to sun-exposed areas.   HISTORY OF SQUAMOUS CELL CARCINOMA OF THE SKIN - No evidence of recurrence today - No lymphadenopathy - Recommend regular full body skin exams - Recommend daily broad spectrum sunscreen SPF 30+ to sun-exposed areas, reapply every 2 hours as needed.  - Call if any new or changing lesions are noted between office visits  HISTORY OF SQUAMOUS CELL CARCINOMA IN SITU OF THE SKIN - No evidence of recurrence today - Recommend regular full body skin exams - Recommend daily broad spectrum sunscreen SPF 30+ to sun-exposed areas, reapply every 2 hours as needed.  - Call if any new or changing lesions are noted between office visits     Neoplasm of uncertain behavior of skin (2) left lower distal anterior leg  Skin / nail biopsy Type of biopsy: tangential   Informed consent: discussed and consent obtained   Timeout: patient name, date of birth, surgical site, and procedure verified   Procedure prep:  Patient was prepped and draped in usual  sterile fashion Prep type:  Isopropyl alcohol Anesthesia: the lesion was anesthetized in a standard fashion   Anesthetic:  1% lidocaine w/ epinephrine 1-100,000 buffered w/ 8.4% NaHCO3 Instrument used: DermaBlade   Hemostasis achieved with: pressure, aluminum chloride and electrodesiccation   Outcome: patient tolerated  procedure well   Post-procedure details: sterile dressing applied and wound care instructions given   Dressing type: bandage and petrolatum    left lateral mid forearm  Skin / nail biopsy Type of biopsy: tangential   Informed consent: discussed and consent obtained   Timeout: patient name, date of birth, surgical site, and procedure verified   Procedure prep:  Patient was prepped and draped in usual sterile fashion Prep type:  Isopropyl alcohol Anesthesia: the lesion was anesthetized in a standard fashion   Anesthetic:  1% lidocaine w/ epinephrine 1-100,000 buffered w/ 8.4% NaHCO3 Instrument used: DermaBlade   Hemostasis achieved with: pressure, aluminum chloride and electrodesiccation   Outcome: patient tolerated procedure well   Post-procedure details: sterile dressing applied and wound care instructions given   Dressing type: bandage and petrolatum    AK (actinic keratosis) Right Upper Arm x 2, left dorsal hand x 2, chest x 1  Actinic keratoses are precancerous spots that appear secondary to cumulative UV radiation exposure/sun exposure over time. They are chronic with expected duration over 1 year. A portion of actinic keratoses will progress to squamous cell carcinoma of the skin. It is not possible to reliably predict which spots will progress to skin cancer and so treatment is recommended to prevent development of skin cancer.  Recommend daily broad spectrum sunscreen SPF 30+ to sun-exposed areas, reapply every 2 hours as needed.  Recommend staying in the shade or wearing long sleeves, sun glasses (UVA+UVB protection) and wide brim hats (4-inch brim around the entire circumference of the hat). Call for new or changing lesions.   Destruction of lesion - Right Upper Arm x 2, left dorsal hand x 2, chest x 1  Destruction method: cryotherapy   Informed consent: discussed and consent obtained   Lesion destroyed using liquid nitrogen: Yes   Cryotherapy cycles:  2 Outcome: patient  tolerated procedure well with no complications   Post-procedure details: wound care instructions given    Inflamed seborrheic keratosis left medial forearm x 1  Symptomatic, irritating, patient would like treated.  Benign-appearing.  Call clinic for new or changing lesions.    Destruction of lesion - left medial forearm x 1  Destruction method: cryotherapy   Informed consent: discussed and consent obtained   Lesion destroyed using liquid nitrogen: Yes   Cryotherapy cycles:  2 Outcome: patient tolerated procedure well with no complications   Post-procedure details: wound care instructions given     Return in about 6 months (around 12/27/2023) for TBSE, Hx SCC.  Anise Salvo, RMA, am acting as scribe for Elie Goody, MD .   Documentation: I have reviewed the above documentation for accuracy and completeness, and I agree with the above.  Elie Goody, MD

## 2023-06-26 NOTE — Patient Instructions (Addendum)
Wound Care Instructions  Cleanse wound gently with soap and water once a day then pat dry with clean gauze. Apply a thin coat of Petrolatum (petroleum jelly, "Vaseline") over the wound (unless you have an allergy to this). We recommend that you use a new, sterile tube of Vaseline. Do not pick or remove scabs. Do not remove the yellow or white "healing tissue" from the base of the wound.  Cover the wound with fresh, clean, nonstick gauze and secure with paper tape. You may use Band-Aids in place of gauze and tape if the wound is small enough, but would recommend trimming much of the tape off as there is often too much. Sometimes Band-Aids can irritate the skin.  You should call the office for your biopsy report after 1 week if you have not already been contacted.  If you experience any problems, such as abnormal amounts of bleeding, swelling, significant bruising, significant pain, or evidence of infection, please call the office immediately.  FOR ADULT SURGERY PATIENTS: If you need something for pain relief you may take 1 extra strength Tylenol (acetaminophen) AND 2 Ibuprofen (200mg  each) together every 4 hours as needed for pain. (do not take these if you are allergic to them or if you have a reason you should not take them.) Typically, you may only need pain medication for 1 to 3 days.    Melanoma ABCDEs  Melanoma is the most dangerous type of skin cancer, and is the leading cause of death from skin disease.  You are more likely to develop melanoma if you: Have light-colored skin, light-colored eyes, or red or blond hair Spend a lot of time in the sun Tan regularly, either outdoors or in a tanning bed Have had blistering sunburns, especially during childhood Have a close family member who has had a melanoma Have atypical moles or large birthmarks  Early detection of melanoma is key since treatment is typically straightforward and cure rates are extremely high if we catch it early.   The  first sign of melanoma is often a change in a mole or a new dark spot.  The ABCDE system is a way of remembering the signs of melanoma.  A for asymmetry:  The two halves do not match. B for border:  The edges of the growth are irregular. C for color:  A mixture of colors are present instead of an even brown color. D for diameter:  Melanomas are usually (but not always) greater than 6mm - the size of a pencil eraser. E for evolution:  The spot keeps changing in size, shape, and color.  Please check your skin once per month between visits. You can use a small mirror in front and a large mirror behind you to keep an eye on the back side or your body.   If you see any new or changing lesions before your next follow-up, please call to schedule a visit.  Please continue daily skin protection including broad spectrum sunscreen SPF 30+ to sun-exposed areas, reapplying every 2 hours as needed when you're outdoors.    Due to recent changes in healthcare laws, you may see results of your pathology and/or laboratory studies on MyChart before the doctors have had a chance to review them. We understand that in some cases there may be results that are confusing or concerning to you. Please understand that not all results are received at the same time and often the doctors may need to interpret multiple results in order to provide  you with the best plan of care or course of treatment. Therefore, we ask that you please give [place] 2 business days to thoroughly review all your results before contacting the office for clarification. Should we see a critical lab result, you will be contacted sooner.   If You Need Anything After Your Visit  If you have any questions or concerns for your doctor, please call our main line at [phone] and press option 4 to reach your doctor's medical assistant. If no one answers, please leave a voicemail as directed and we will return your call as soon as possible. Messages left after 4  pm will be answered the following business day.   You may also send [place] a message via MyChart. We typically respond to MyChart messages within 1-2 business days.  For prescription refills, please ask your pharmacy to contact our office. Our fax number is [phone].  If you have an urgent issue when the clinic is closed that cannot wait until the next business day, you can page your doctor at the number below.    Please note that while we do our best to be available for urgent issues outside of office hours, we are not available 24/7.   If you have an urgent issue and are unable to reach [place], you may choose to seek medical care at your doctor's office, retail clinic, urgent care center, or emergency room.  If you have a medical emergency, please immediately call 911 or go to the emergency department.  Pager Numbers  - Dr. Gwen Pounds: [phone]  - Dr. Roseanne Reno: [phone]  In the event of inclement weather, please call our main line at [phone] for an update on the status of any delays or closures.  Dermatology Medication Tips: Please keep the boxes that topical medications come in in order to help keep track of the instructions about where and how to use these. Pharmacies typically print the medication instructions only on the boxes and not directly on the medication tubes.   If your medication is too expensive, please contact our office at [phone] option 4 or send [place] a message through MyChart.   We are unable to tell what your co-pay for medications will be in advance as this is different depending on your insurance coverage. However, we may be able to find a substitute medication at lower cost or fill out paperwork to get insurance to cover a needed medication.   If a prior authorization is required to get your medication covered by your insurance company, please allow [place] 1-2 business days to complete this process.  Drug prices often vary depending on where the  prescription is filled and some pharmacies may offer cheaper prices.  The website www.goodrx.com contains coupons for medications through different pharmacies. The prices here do not account for what the cost may be with help from insurance (it may be cheaper with your insurance), but the website can give you the price if you did not use any insurance.  - You can print the associated coupon and take it with your prescription to the pharmacy.  - You may also stop by our office during regular business hours and pick up a GoodRx coupon card.  - If you need your prescription sent electronically to a different pharmacy, notify our office through [facility] or by phone at [phone] option 4.

## 2023-07-03 ENCOUNTER — Telehealth: Payer: Self-pay

## 2023-07-03 DIAGNOSIS — C4492 Squamous cell carcinoma of skin, unspecified: Secondary | ICD-10-CM

## 2023-07-03 NOTE — Telephone Encounter (Signed)
-----   Message from [facility] sent at 07/02/2023  8:02 PM EDT ----- Diagnosis: 1. Skin , left lower distal anterior leg WELL DIFFERENTIATED SQUAMOUS CELL CARCINOMA  2. Skin , left lateral mid forearm WELL DIFFERENTIATED SQUAMOUS CELL CARCINOMA WITH SUPERFICIAL INFILTRATION  Please call to share diagnosis and discuss treatment options. Please message me with patient's choice and schedule ED&C or excision  Explanation: Both biopsies show squamous cell skin cancers that have grown beyond the surface of the skin and are invading the second layer of the skin. They have the potential to spread beyond the skin and threaten your health, so I recommend treating them.  Treatment for LEG: Mohs - Given the location and type of skin cancer, I recommend Mohs surgery. Mohs surgery involves cutting out the skin cancer and then checking under the microscope to ensure the whole skin cancer was removed. If any skin cancer remains, the surgeon will cut out more until it is fully removed. The cure rate is about 98-99%. Once the Mohs surgeon confirms the skin cancer is out, they will discuss the options to repair or heal the area. You must take it easy for about two weeks after surgery (no lifting over 10-15 lbs, avoid activity to get your heart rate and blood pressure up). It is done at another office outside of [place] ([place], [place], or [place]). Approximately 98-99% cure rate  If the patient asks for my recommendation for Mohs surgeon, I recommend Dr Caprice Beaver or Dr Coralie Carpen at [facility] if the patient is able to make the trip.  Treatment for ARM: Excision - you return for an hour long appointment in our clinic where we perform a skin surgery. We numb the site of the skin cancer and a safety margin of normal skin around it. We remove the full thickness of skin and close the wound with two layers of stitches. The sample is sent to the lab to check that the skin cancer was fully removed. Return one  week later to have wound checked and surface stitches removed. Surgical wound leaves a line scar. Approximately 95% cure rate

## 2023-07-03 NOTE — Addendum Note (Signed)
Addended by: Dorathy Daft R on: 07/03/2023 12:34 PM   Modules accepted: Orders

## 2023-07-03 NOTE — Telephone Encounter (Signed)
Patient advised of BX results. Referral sent to Dr. Lorn Junes as she has had [facility] with him before.  Excision scheduled with Dr. Katrinka Blazing 07/17/23. aw

## 2023-07-15 ENCOUNTER — Ambulatory Visit: Payer: Medicare HMO | Attending: General Surgery

## 2023-07-15 VITALS — Wt 178.0 lb

## 2023-07-15 DIAGNOSIS — Z483 Aftercare following surgery for neoplasm: Secondary | ICD-10-CM | POA: Insufficient documentation

## 2023-07-15 NOTE — Therapy (Signed)
OUTPATIENT PHYSICAL THERAPY SOZO SCREENING NOTE   Patient Name: Katelyn Ball MRN: [id number] DOB:05-29-1946, 77 y.o., female Today's Date: 07/15/2023  PCP: [name], MD REFERRING PROVIDER: Griselda Miner, MD   PT End of Session - 07/15/23 1035     Visit Number 1   # unchanged due to screen only   PT Start Time 1034    PT Stop Time 1038    PT Time Calculation (min) 4 min    Activity Tolerance Patient tolerated treatment well    Behavior During Therapy WFL for tasks assessed/performed             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   right upper arm, ATYPICAL SQUAMOUS PROLIFERATION, 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, re-biopsied and treated with [facility] 11/15/21   SCC (squamous cell carcinoma) 06/26/2023   L lower distal anterior leg, referred to [facility]   SCC (squamous cell carcinoma) 06/26/2023   L lateral mid forearm, schedule excision   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   left posterior calf inferior to popliteal fossa   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   left upper cutaneous lip   Vitamin D deficiency    Past Surgical History:  Procedure Laterality Date   ABDOMINAL HYSTERECTOMY     BACK SURGERY     BREAST BIOPSY Right 03/26/2022   u/s bx rt 12:00 5cmfn venus clip path pending   BREAST CYST ASPIRATION Left    BREAST CYST ASPIRATION Right 09/25/2017   2 FNA's done on cysts   CHOLECYSTECTOMY     COLONOSCOPY WITH PROPOFOL  N/A 10/05/2015   Procedure: COLONOSCOPY WITH PROPOFOL;  Surgeon: Scot Jun, MD;  Location: [facility] ENDOSCOPY;  Service: Endoscopy;  Laterality: N/A;   COLONOSCOPY WITH PROPOFOL N/A 04/22/2023   Procedure: COLONOSCOPY WITH PROPOFOL;  Surgeon: Regis Bill, MD;  Location: [facility] ENDOSCOPY;  Service: Endoscopy;  Laterality: N/A;   ESOPHAGOGASTRODUODENOSCOPY (EGD) WITH PROPOFOL N/A 10/05/2015   Procedure: ESOPHAGOGASTRODUODENOSCOPY (EGD) WITH PROPOFOL;  Surgeon: Scot Jun, MD;  Location: [facility] ENDOSCOPY;  Service: Endoscopy;  Laterality: N/A;   JOINT REPLACEMENT Bilateral 11/2012   LAMINECTOMY AND MICRODISCECTOMY LUMBAR SPINE     MASTECTOMY     MASTECTOMY W/ SENTINEL NODE BIOPSY Right 04/23/2022   Procedure: RIGHT MASTECTOMY;  Surgeon: Griselda Miner, MD;  [facility];  Service: General;  Laterality: Right;   PILONIDAL CYST DRAINAGE     SENTINEL NODE BIOPSY Right 04/23/2022   Procedure: RIGHT SENTINEL NODE BIOPSY;  Surgeon: Griselda Miner, MD;  Location: [facility] OR;  Service: General;  Laterality: Right;   TONSILLECTOMY     Patient Active Problem List   Diagnosis Date Noted   Cancer of right female breast (HCC) 04/23/2022   Genetic testing 04/20/2022   Malignant neoplasm of upper-outer quadrant of  right breast in female, estrogen receptor positive (HCC) 04/04/2022   Prediabetes 03/27/2022   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   Vitamin D deficiency 05/24/2021   Vaginal enterocele 05/24/2021   Colonic polyp 05/24/2021   GERD (gastroesophageal reflux disease) 05/24/2021   Chronic obstructive pulmonary disease, unspecified (HCC) 05/24/2021   Allergic rhinitis due to allergen 05/24/2021   Coronary artery calcification seen on CT scan 12/18/2017   Aortic atherosclerosis (HCC) 12/18/2017   Recurrent major depressive disorder, in full remission (HCC) 10/18/2016   Diverticulosis of large intestine without hemorrhage 10/18/2015   Trochanteric bursitis of right hip 01/25/2015    Lumbar radiculitis 01/25/2015   Pure hypercholesterolemia 11/02/2014   Essential hypertension 11/02/2014   B12 deficiency 09/15/2014    REFERRING DIAG: right breast cancer at risk for lymphedema  THERAPY DIAG:  Aftercare following surgery for neoplasm  PERTINENT HISTORY: Patient was diagnosed on 02/19/2022 with 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 She underwent a right mastectomy and sentinel node biopsy (5 negative nodes removed) on 04/23/2022. It is ER/PR positive and HER2 negative with an unknown Ki67.  PRECAUTIONS: right UE Lymphedema risk, None  SUBJECTIVE: Pt returns for her 3 month L-Dex screen.   PAIN:  Are you having pain? No  SOZO SCREENING: Patient was assessed today using the SOZO machine to determine the lymphedema index score. This was compared to her baseline score. It was determined that she is within the recommended range when compared to her baseline and no further action is needed at this time. She will continue SOZO screenings. These are done every 3 months for 2 years post operatively followed by every 6 months for 2 years, and then annually.   L-DEX FLOWSHEETS - 07/15/23 1000       L-DEX LYMPHEDEMA SCREENING   Measurement Type Unilateral    L-DEX MEASUREMENT EXTREMITY Upper Extremity    POSITION  Standing    DOMINANT SIDE Right    At Risk Side Right    BASELINE SCORE (UNILATERAL) -0.3    L-DEX SCORE (UNILATERAL) -1.2    VALUE CHANGE (UNILAT) -0.9              Hermenia Bers, PTA 07/15/2023, 10:37 AM

## 2023-07-16 ENCOUNTER — Telehealth: Payer: Self-pay | Admitting: Adult Health

## 2023-07-17 ENCOUNTER — Ambulatory Visit (INDEPENDENT_AMBULATORY_CARE_PROVIDER_SITE_OTHER): Payer: Medicare HMO | Admitting: Dermatology

## 2023-07-17 ENCOUNTER — Encounter: Payer: Self-pay | Admitting: Dermatology

## 2023-07-17 VITALS — BP 103/64 | HR 85

## 2023-07-17 DIAGNOSIS — C44629 Squamous cell carcinoma of skin of left upper limb, including shoulder: Secondary | ICD-10-CM | POA: Diagnosis not present

## 2023-07-17 DIAGNOSIS — D485 Neoplasm of uncertain behavior of skin: Secondary | ICD-10-CM

## 2023-07-17 MED ORDER — MUPIROCIN 2 % EX OINT: 1.0000 | TOPICAL_OINTMENT | Freq: Every day | CUTANEOUS | 0 refills | Status: DC

## 2023-07-17 NOTE — Patient Instructions (Addendum)

## 2023-07-17 NOTE — Progress Notes (Addendum)
   Follow-Up Visit   Subjective  Katelyn Ball is a 77 y.o. 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  Objective  Well appearing patient in no apparent distress; mood and affect are within normal limits.  A focused examination was performed of the following areas: Left arm Relevant physical exam findings are noted in the Assessment and Plan.   left lateral mid forearm Pink healing bx site    Assessment & Plan   Neoplasm of uncertain behavior of skin left lateral mid forearm  Skin excision  Lesion length (cm):  0.9 Total excision diameter (cm):  1.7 Informed consent: discussed and consent obtained   Timeout: patient name, date of birth, surgical site, and procedure verified   Procedure prep:  Patient was prepped and draped in usual sterile fashion Prep type:  Isopropyl alcohol and povidone-iodine Anesthesia: the lesion was anesthetized in a standard fashion   Anesthetic:  1% lidocaine w/ epinephrine 1-100,000 buffered w/ 8.4% NaHCO3 Instrument used: #15 blade   Hemostasis achieved with: pressure and electrodesiccation   Outcome: patient tolerated procedure well with no complications   Post-procedure details: sterile dressing applied and wound care instructions given   Dressing type: bandage and pressure dressing (mupirocin)    Skin repair Complexity:  Intermediate Final length (cm):  6.2 Informed consent: discussed and consent obtained   Timeout: patient name, date of birth, surgical site, and procedure verified   Procedure prep:  Patient was prepped and draped in usual sterile fashion Prep type:   Chlorhexidine Anesthesia: the lesion was anesthetized in a standard fashion   Anesthetic:  1% lidocaine w/ epinephrine 1-100,000 local infiltration (6 cc lido w/epi, 3 cc bupivicaine) Reason for type of repair: reduce tension to allow closure, reduce the risk of dehiscence, infection, and necrosis, allow closure of the large defect, preserve normal anatomy and preserve normal anatomical and functional relationships   Undermining: edges undermined   Subcutaneous layers (deep stitches):  Suture size:  4-0 Suture type: Monocryl (poliglecaprone 25)   Stitches:  Buried vertical mattress Fine/surface layer approximation (top stitches):  Suture size:  5-0 Suture type: Prolene (polypropylene)   Stitches: simple running   Suture removal (days):  7 Hemostasis achieved with: suture, pressure and electrodesiccation Outcome: patient tolerated procedure well with no complications   Post-procedure details: wound care instructions given   Additional details:  Mupirocin and a pressure dressing applied  Specimen 1 - Surgical pathology Differential Diagnosis: BX proven WELL DIFFERENTIATED SQUAMOUS CELL CARCINOMA WITH SUPERFICIAL INFILTRATION  Check Margins: yes Pink healing bx site [id number] Tagged lateral       Return in about 1 week (around 07/24/2023) for Suture Removal, with Dr. Katrinka Blazing.  Anise Salvo, RMA, am acting as scribe for Elie Goody, MD .   Documentation: I have reviewed the above documentation for accuracy and completeness, and I agree with the above.  Elie Goody, MD

## 2023-07-19 ENCOUNTER — Telehealth: Payer: Self-pay

## 2023-07-19 NOTE — Telephone Encounter (Signed)
Patient doing fine s/p surgery,. Butch Penny., RMA

## 2023-07-23 ENCOUNTER — Telehealth: Payer: Self-pay

## 2023-07-23 NOTE — Telephone Encounter (Signed)
-----   Message from [place] sent at 07/22/2023  8:55 PM EDT ----- Diagnosis Skin (M), left lateral mid forearm EXCISION, RESIDUAL SQUAMOUS CELL CARCINOMA, MARGINS FREE  Please call to share that excision was clear of SCC and get update on surgical wound. Thank you.

## 2023-07-23 NOTE — Telephone Encounter (Signed)
Called patient to discuss pathology results. N/A. LMOVM to C/B if any questions or if wound is not healing properly. Will discuss at S/R appointment.

## 2023-07-24 ENCOUNTER — Ambulatory Visit (INDEPENDENT_AMBULATORY_CARE_PROVIDER_SITE_OTHER): Payer: Medicare HMO | Admitting: Dermatology

## 2023-07-24 ENCOUNTER — Encounter: Payer: Self-pay | Admitting: Dermatology

## 2023-07-24 DIAGNOSIS — Z4802 Encounter for removal of sutures: Secondary | ICD-10-CM

## 2023-07-24 NOTE — Patient Instructions (Signed)
 Recommend daily broad spectrum sunscreen SPF 30+ to sun-exposed areas, reapply every 2 hours as needed. Call for new or changing lesions.  Staying in the shade or wearing long sleeves, sun glasses (UVA+UVB protection) and wide brim hats (4-inch brim around the entire circumference of the hat) are also recommended for sun protection.    Due to recent changes in healthcare laws, you may see results of your pathology and/or laboratory studies on MyChart before the doctors have had a chance to review them. We understand that in some cases there may be results that are confusing or concerning to you. Please understand that not all results are received at the same time and often the doctors may need to interpret multiple results in order to provide you with the best plan of care or course of treatment. Therefore, we ask that you please give [place] 2 business days to thoroughly review all your results before contacting the office for clarification. Should we see a critical lab result, you will be contacted sooner.   If You Need Anything After Your Visit  If you have any questions or concerns for your doctor, please call our main line at [phone] and press option 4 to reach your doctor's medical assistant. If no one answers, please leave a voicemail as directed and we will return your call as soon as possible. Messages left after 4 pm will be answered the following business day.   You may also send [place] a message via MyChart. We typically respond to MyChart messages within 1-2 business days.  For prescription refills, please ask your pharmacy to contact our office. Our fax number is [phone].  If you have an urgent issue when the clinic is closed that cannot wait until the next business day, you can page your doctor at the number below.    Please note that while we do our best to be available for urgent issues outside of office hours, we are not available 24/7.   If you have an urgent issue and are  unable to reach [place], you may choose to seek medical care at your doctor's office, retail clinic, urgent care center, or emergency room.  If you have a medical emergency, please immediately call 911 or go to the emergency department.  Pager Numbers  - Dr. Gwen Pounds: [phone]  - Dr. Roseanne Reno: [phone]  - Dr. Katrinka Blazing: [phone]   In the event of inclement weather, please call our main line at [phone] for an update on the status of any delays or closures.  Dermatology Medication Tips: Please keep the boxes that topical medications come in in order to help keep track of the instructions about where and how to use these. Pharmacies typically print the medication instructions only on the boxes and not directly on the medication tubes.   If your medication is too expensive, please contact our office at [phone] option 4 or send [place] a message through MyChart.   We are unable to tell what your co-pay for medications will be in advance as this is different depending on your insurance coverage. However, we may be able to find a substitute medication at lower cost or fill out paperwork to get insurance to cover a needed medication.   If a prior authorization is required to get your medication covered by your insurance company, please allow [place] 1-2 business days to complete this process.  Drug prices often vary depending on where the prescription is filled and some pharmacies may offer cheaper prices.  The website  www.goodrx.com contains coupons for medications through different pharmacies. The prices here do not account for what the cost may be with help from insurance (it may be cheaper with your insurance), but the website can give you the price if you did not use any insurance.  - You can print the associated coupon and take it with your prescription to the pharmacy.  - You may also stop by our office during regular business hours and pick up a GoodRx coupon card.  - If you need your  prescription sent electronically to a different pharmacy, notify our office through [facility] or by phone at [phone] option 4.     Si Usted Necesita Algo Despus de Su Visita  Tambin puede enviarnos un mensaje a travs de Clinical cytogeneticist. Por lo general respondemos a los mensajes de MyChart en el transcurso de 1 a 2 das hbiles.  Para renovar recetas, por favor pida a su farmacia que se ponga en contacto con nuestra oficina. Annie Sable de fax es [place] [phone].  Si tiene un asunto urgente cuando la clnica est cerrada y que no puede esperar hasta el siguiente da hbil, puede llamar/localizar a su doctor(a) al nmero que aparece a continuacin.   Por favor, tenga en cuenta que aunque hacemos todo lo posible para estar disponibles para asuntos urgentes fuera del horario de [place], no estamos disponibles las 24 horas del da, los 7 [address]  [address] de la [place].   Si tiene un problema urgente y no puede comunicarse con nosotros, puede optar por buscar atencin mdica  en el consultorio de su doctor(a), en una clnica privada, en un centro de atencin urgente o en una sala de emergencias.  Si tiene Engineer, drilling, por favor llame inmediatamente al 911 o vaya a la sala de emergencias.  Nmeros de bper  - Dr. Gwen Pounds: [phone]  - Dra. Roseanne Reno: [phone]  - Dr. Katrinka Blazing: [phone]   En caso de inclemencias del tiempo, por favor llame a Lacy Duverney principal al [phone] para una actualizacin sobre el [place] de cualquier retraso o cierre.  Consejos para la medicacin en dermatologa: Por favor, guarde las cajas en las que vienen los medicamentos de uso tpico para ayudarle a seguir las instrucciones sobre dnde y cmo usarlos. Las farmacias generalmente imprimen las instrucciones del medicamento slo en las cajas y no directamente en los tubos del [place].   Si su medicamento es muy caro, por favor, pngase en contacto con Rolm [place] llamando al  [phone] y presione la opcin 4 o envenos un mensaje a travs de Clinical cytogeneticist.   No podemos decirle cul ser su copago por los medicamentos por adelantado ya que esto es diferente dependiendo de la cobertura de su seguro. Sin embargo, es posible que podamos encontrar un medicamento sustituto a Audiological scientist un formulario para que el seguro cubra el medicamento que se considera necesario.   Si se requiere una autorizacin previa para que su compaa de seguros [place] su medicamento, por favor permtanos de 1 a 2 das hbiles para completar [address]  Los precios de los medicamentos varan con frecuencia dependiendo del Environmental consultant de dnde se surte la receta y alguna farmacias pueden ofrecer precios ms baratos.  El sitio web www.goodrx.com tiene cupones para medicamentos de Health and safety inspector. Los precios aqu no tienen en cuenta lo que podra costar con la ayuda del seguro (puede ser ms barato con su seguro), pero el sitio web puede darle el precio si no utiliz Tourist information centre manager.  - Puede  imprimir el cupn correspondiente y llevarlo con su receta a la farmacia.  - Tambin puede pasar por nuestra oficina durante el horario de atencin regular y Education officer, museum una tarjeta de cupones de GoodRx.  - Si necesita que su receta se enve electrnicamente a una farmacia diferente, informe a nuestra oficina a travs de MyChart de [place] o por telfono llamando al [phone] y presione la opcin 4.

## 2023-07-24 NOTE — Progress Notes (Signed)
   Follow-Up Visit   Subjective  Katelyn Ball is a 77 y.o. female who presents for the following: Suture removal at left lateral mid forearm.  Pathology showed residual SCC, margins free.  The following portions of the chart were reviewed this encounter and updated as appropriate: medications, allergies, medical history  Review of Systems:  No other skin or systemic complaints except as noted in HPI or Assessment and Plan.  Objective  Well appearing patient in no apparent distress; mood and affect are within normal limits.  Areas Examined: Left arm  Relevant physical exam findings are noted in the Assessment and Plan.    Assessment & Plan    Encounter for Removal of Sutures - Incision site is clean, dry and intact. - Wound cleansed, sutures removed, wound cleansed and steri strips applied.  - Discussed pathology results showing residual SCC, margins free - Patient advised to keep steri-strips dry until they fall off. - Scars remodel for a full year. - Once steri-strips fall off, patient can apply over-the-counter silicone scar cream once to twice a day to help with scar remodeling if desired. - Patient advised to call with any concerns or if they notice any new or changing lesions.  Return for UBSE As Scheduled.  I, Lawson Radar, CMA, am acting as scribe for Elie Goody, MD.    Documentation: I have reviewed the above documentation for accuracy and completeness, and I agree with the above.  Elie Goody, MD

## 2023-08-01 ENCOUNTER — Encounter: Payer: Self-pay | Admitting: Adult Health

## 2023-08-01 ENCOUNTER — Inpatient Hospital Stay: Payer: Medicare HMO | Attending: Adult Health | Admitting: Adult Health

## 2023-08-01 VITALS — BP 136/63 | HR 71 | Temp 97.8°F | Resp 16 | Wt 182.5 lb

## 2023-08-01 DIAGNOSIS — C50411 Malignant neoplasm of upper-outer quadrant of right female breast: Secondary | ICD-10-CM | POA: Diagnosis present

## 2023-08-01 DIAGNOSIS — Z87891 Personal history of nicotine dependence: Secondary | ICD-10-CM | POA: Insufficient documentation

## 2023-08-01 DIAGNOSIS — Z170 Estrogen receptor positive status [ER+]: Secondary | ICD-10-CM | POA: Diagnosis not present

## 2023-08-01 DIAGNOSIS — Z9011 Acquired absence of right breast and nipple: Secondary | ICD-10-CM | POA: Insufficient documentation

## 2023-08-01 DIAGNOSIS — Z79811 Long term (current) use of aromatase inhibitors: Secondary | ICD-10-CM | POA: Insufficient documentation

## 2023-08-01 MED ORDER — EXEMESTANE 25 MG PO TABS
25.0000 mg | ORAL_TABLET | Freq: Every day | ORAL | 1 refills | Status: DC
Start: 2023-08-01 — End: 2023-08-24

## 2023-08-01 MED ORDER — BUPROPION HCL ER (XL) 300 MG PO TB24: 300.0000 mg | ORAL_TABLET | Freq: Every day | ORAL | 1 refills | Status: DC

## 2023-08-01 NOTE — Progress Notes (Signed)
[facility] Cancer Follow up:    [name], MD [address] [facility] [place] [address]   DIAGNOSIS:  Cancer Staging  Malignant neoplasm of upper-outer quadrant of right breast in female, estrogen receptor positive (HCC) Staging form: Breast, AJCC 8th Edition - Clinical: Stage IA (cT1c, cN0, cM0, G1, ER+, PR+, HER2-) - Signed by Serena Croissant, MD on 04/04/2022 Stage prefix: Initial diagnosis Histologic grading system: 3 grade system   SUMMARY OF ONCOLOGIC HISTORY: Oncology History  Malignant neoplasm of upper-outer quadrant of right breast in female, estrogen receptor positive (HCC)  03/26/2022 Initial Diagnosis   Screening detected right breast distortion at 12:00: 1.1 cm, axilla negative, ultrasound biopsy: Grade 1 ILC, ER greater than 90%, PR greater than 90%, HER2 negative, Ki-67 not done   04/04/2022 Cancer Staging   Staging form: Breast, AJCC 8th Edition - Clinical: Stage IA (cT1c, cN0, cM0, G1, ER+, PR+, HER2-) - Signed by Serena Croissant, MD on 04/04/2022 Stage prefix: Initial diagnosis Histologic grading system: 3 grade system    Genetic Testing   [facility] CustomNext Panel was Negative. Report date is 04/19/2022.  The CustomNext-Cancer+RNAinsight panel offered by [employer] includes sequencing and rearrangement analysis for the following 47 genes:  APC, ATM, AXIN2, BARD1, BMPR1A, BRCA1, BRCA2, BRIP1, CDH1, CDK4, CDKN2A, CHEK2, CTNNA1, DICER1, EPCAM, GREM1, HOXB13, KIT, MEN1, MLH1, MSH2, MSH3, MSH6, MUTYH, NBN, NF1, NTHL1, PALB2, PDGFRA, PMS2, POLD1, POLE, PTEN, RAD50, RAD51C, RAD51D, SDHA, SDHB, SDHC, SDHD, SMAD4, SMARCA4, STK11, TP53, TSC1, TSC2, and VHL.  RNA data is routinely analyzed for use in variant interpretation for all genes.   04/23/2022 Surgery   Right Mastectomy: Grade 2 ILC 2.1 cm, LG DCIS, ALH, margins Neg, Benign IM LN, 0/5 LN Neg, ER > 90%, PR > 90%, HER2 negative, Ki-67 not done   06/02/2022 -  Anti-estrogen oral therapy    Letrozole 2.5 mg daily x7 years      CURRENT THERAPY: Letrozole  INTERVAL HISTORY: Katelyn Ball 77 y.o. female returns for follow-up of her history of breast cancer.  When she saw Dr. Pamelia Hoit in April of this year she had stopped the letrozole due to hot flashes.  She was recommended to then restart anastrozole.  She tried to take the anastrozole 2 weeks after stopping letrozole.  2 days after starting the anastrozole she developed severe explosive diarrhea.  She says this continued for three weeks.  She underwent colonoscopy in the 04/22/2023 which demonstrated microscopic colitis on pathologic.    She underwent right breast mastectomy on Apr 01, 2023 that demonstrated, indeterminate enhancing mass in the lateral central portion of the left breast warranting tissue diagnosis.  She underwent MRI guided biopsy on Apr 04, 2023 demonstrating fibrocystic change with aper Kren metaplasia and dystrophic calcifications, negative for carcinoma.  Patient is recommended to repeat left breast mammogram and ultrasound to evaluate for stability in the left breast mass.  Patient is recommended to return in May 2025 for repeat breast MRI.  Her most recent bone density test occurred on September 26, 2022 at [facility] clinic demonstrating a T-score of 0 which is normal.  He also underwent CT lung cancer screening on December 07, 2022 that was benign in nature repeat was recommended to occur in January 2025.  She notes she is having increased stress because her husband was increasingly ill here in the hospital and was transferred to [facility] and diagnosed with cancer.    Patient Active Problem List   Diagnosis Date  Noted   Genetic testing 04/20/2022   Malignant neoplasm of upper-outer quadrant of right breast in female, estrogen receptor positive (HCC) 04/04/2022   Prediabetes 03/27/2022   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   Vitamin D deficiency 05/24/2021   Vaginal enterocele 05/24/2021   Colonic polyp  05/24/2021   GERD (gastroesophageal reflux disease) 05/24/2021   Chronic obstructive pulmonary disease, unspecified (HCC) 05/24/2021   Allergic rhinitis due to allergen 05/24/2021   Coronary artery calcification seen on CT scan 12/18/2017   Aortic atherosclerosis (HCC) 12/18/2017   Recurrent major depressive disorder, in full remission (HCC) 10/18/2016   Diverticulosis of large intestine without hemorrhage 10/18/2015   Trochanteric bursitis of right hip 01/25/2015   Lumbar radiculitis 01/25/2015   Pure hypercholesterolemia 11/02/2014   Essential hypertension 11/02/2014   B12 deficiency 09/15/2014   is allergic to pneumococcal vaccine, sulfa antibiotics, anastrozole, ciprofloxacin, cyclobenzaprine, cymbalta [duloxetine hcl], demerol [meperidine], erythromycin, fiorinal [butalbital-aspirin-caffeine], fish oil, motrin [ibuprofen], naproxen, nitrofurantoin, other, cephalexin, and duloxetine.  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   left upper cutaneous lip   Vitamin D deficiency    SURGICAL HISTORY: Past Surgical History:  Procedure Laterality Date   ABDOMINAL HYSTERECTOMY     BACK SURGERY     BREAST BIOPSY Right 03/26/2022   u/s bx rt 12:00 5cmfn venus clip path pending   BREAST CYST ASPIRATION Left    BREAST CYST ASPIRATION Right 09/25/2017   2 FNA's done on cysts   CHOLECYSTECTOMY     COLONOSCOPY WITH PROPOFOL N/A 10/05/2015   Procedure: COLONOSCOPY WITH PROPOFOL;  Surgeon: Scot Jun, MD;  Location: [facility] ENDOSCOPY;  Service: Endoscopy;  Laterality: N/A;   COLONOSCOPY WITH PROPOFOL N/A 04/22/2023   Procedure: COLONOSCOPY WITH PROPOFOL;  Surgeon: Regis Bill, MD;  Location: [facility] ENDOSCOPY;  Service: Endoscopy;  Laterality: N/A;   ESOPHAGOGASTRODUODENOSCOPY (EGD) WITH PROPOFOL N/A 10/05/2015   Procedure: ESOPHAGOGASTRODUODENOSCOPY (EGD) WITH PROPOFOL;  Surgeon: Scot Jun, MD;  Location: [facility] ENDOSCOPY;  Service: Endoscopy;  Laterality: N/A;   JOINT REPLACEMENT Bilateral 11/2012   LAMINECTOMY AND MICRODISCECTOMY LUMBAR SPINE     MASTECTOMY     MASTECTOMY W/ SENTINEL NODE BIOPSY Right 04/23/2022   Procedure: RIGHT MASTECTOMY;  Surgeon: Griselda Miner, MD;  Location: [facility] OR;  Service: General;  Laterality: Right;   PILONIDAL CYST DRAINAGE     SENTINEL NODE  BIOPSY Right 04/23/2022   Procedure: RIGHT SENTINEL NODE BIOPSY;  Surgeon: Griselda Miner, MD;  Location: [facility] OR;  Service: General;  Laterality: Right;   TONSILLECTOMY     SOCIAL HISTORY: Social History   Socioeconomic History   Marital status: Married    Spouse name: Not on file   Number of children: Not on file   Years of education: Not on file   Highest education level: Not on file  Occupational History   Not on file  Tobacco Use   Smoking status: Former    Current packs/day: 0.00    Average packs/day: 0.5 packs/day for 40.0 years (20.0 ttl pk-yrs)    Types: Cigarettes    Start date: 10/03/1968     Quit date: 10/03/2008    Years since quitting: 14.8   Smokeless tobacco: Never  Vaping Use   Vaping status: Never Used  Substance and Sexual Activity   Alcohol use: No   Drug use: No   Sexual activity: Not on file  Other Topics Concern   Not on file  Social History Narrative   Not on file   Social Determinants of Health   Financial Resource Strain: Low Risk  (05/12/2023)   Received from [facility], [facility]   Overall Financial Resource Strain (CARDIA)    Difficulty of Paying Living Expenses: Not hard at all  Food Insecurity: No Food Insecurity (05/12/2023)   Received from [facility], [facility]   Hunger Vital Sign    Worried About Running Out of Food in the Last Year: Never true    Ran Out of Food in the Last Year: Never true  Transportation Needs: No Transportation Needs (05/12/2023)   Received from [facility], [facility]   [facility] - Transportation    In the past 12 months, has lack of transportation kept you from medical appointments or from getting medications?: No    Lack of Transportation (Non-Medical): No  Physical Activity: Not on file  Stress: Not on file  Social Connections: Not on file  Intimate Partner Violence: Not on file   FAMILY HISTORY: Family History  Problem Relation Age of Onset   Rectal cancer Mother 51   Prostate cancer Father        dx. 23s, metastatic   Lymphoma Maternal Uncle    Breast cancer Neg Hx    Review of Systems  Constitutional:  Negative for appetite change, chills, fatigue, fever and unexpected weight change.  HENT:   Negative for hearing loss, lump/mass and trouble swallowing.   Eyes:  Negative for eye problems and icterus.  Respiratory:  Negative for chest tightness, cough and shortness of breath.   Cardiovascular:  Negative for chest pain, leg swelling and palpitations.  Gastrointestinal:  Negative for abdominal  distention, abdominal pain, constipation, diarrhea, nausea and vomiting.  Endocrine: Negative for hot flashes.  Genitourinary:  Negative for difficulty urinating.   Musculoskeletal:  Negative for arthralgias.  Skin:  Negative for itching and rash.  Neurological:  Negative for dizziness, extremity weakness, headaches and numbness.  Hematological:  Negative for adenopathy. Does not bruise/bleed easily.  Psychiatric/Behavioral:  Negative for depression. The patient is not nervous/anxious.     PHYSICAL EXAMINATION     Vitals:   08/01/23 1028  BP: 136/63  Pulse: 71  Resp: 16  Temp: 97.8 F (36.6 C)  SpO2: 99%    Physical Exam Constitutional:      General: She  is not in acute distress.    Appearance: Normal appearance. She is not toxic-appearing.  HENT:     Head: Normocephalic and atraumatic.     Mouth/Throat:     Mouth: Mucous membranes are moist.     Pharynx: Oropharynx is clear. No oropharyngeal exudate or posterior oropharyngeal erythema.  Eyes:     General: No scleral icterus. Cardiovascular:     Rate and Rhythm: Normal rate and regular rhythm.     Pulses: Normal pulses.     Heart sounds: Normal heart sounds.  Pulmonary:     Effort: Pulmonary effort is normal.     Breath sounds: Normal breath sounds.  Chest:     Comments: Status post right breast mastectomies, no sign of local recurrence; left breast is benign. Abdominal:     General: Abdomen is flat. Bowel sounds are normal. There is no distension.     Palpations: Abdomen is soft.     Tenderness: There is no abdominal tenderness.  Musculoskeletal:        General: No swelling.     Cervical back: Neck supple.  Lymphadenopathy:     Cervical: No cervical adenopathy.  Skin:    General: Skin is warm and dry.     Findings: No rash.  Neurological:     General: No focal deficit present.     Mental Status: She is alert.  Psychiatric:        Mood and Affect: Mood normal.        Behavior: Behavior normal.      LABORATORY DATA:  CBC    Component Value Date/Time   WBC 6.1 04/04/2022 1231   WBC 6.6 12/02/2012 1046   RBC 3.85 (L) 04/04/2022 1231   HGB 12.0 04/04/2022 1231   HGB 9.6 (L) 12/17/2012 0449   HCT 34.7 (L) 04/04/2022 1231   HCT 36.0 12/02/2012 1046   PLT 275 04/04/2022 1231   PLT 244 12/17/2012 0449   MCV 90.1 04/04/2022 1231   MCV 92 12/02/2012 1046   MCH 31.2 04/04/2022 1231   MCHC 34.6 04/04/2022 1231   RDW 13.5 04/04/2022 1231   RDW 14.7 (H) 12/02/2012 1046   LYMPHSABS 1.4 04/04/2022 1231   MONOABS 0.5 04/04/2022 1231   EOSABS 0.0 04/04/2022 1231   BASOSABS 0.0 04/04/2022 1231    CMP     Component Value Date/Time   NA 139 04/20/2022 1111   NA 145 12/17/2012 0449   K 4.5 04/20/2022 1111   K 3.8 12/17/2012 0449   CL 105 04/20/2022 1111   CL 112 (H) 12/17/2012 0449   CO2 29 04/20/2022 1111   CO2 24 12/17/2012 0449   GLUCOSE 94 04/20/2022 1111   GLUCOSE 93 12/17/2012 0449   BUN 19 04/20/2022 1111   BUN 13 12/17/2012 0449   CREATININE 0.89 04/20/2022 1111   CREATININE 1.05 (H) 04/04/2022 1231   CREATININE 0.98 12/17/2012 0449   CALCIUM 9.7 04/20/2022 1111   CALCIUM 7.9 (L) 12/17/2012 0449   PROT 6.2 (L) 04/04/2022 1231   ALBUMIN 4.0 04/04/2022 1231   AST 24 04/04/2022 1231   ALT 18 04/04/2022 1231   ALKPHOS 74 04/04/2022 1231   BILITOT 0.5 04/04/2022 1231   GFRNONAA >60 04/20/2022 1111   GFRNONAA 55 (L) 04/04/2022 1231   GFRNONAA >60 12/17/2012 0449   GFRAA >60 12/17/2012 0449      ASSESSMENT and THERAPY PLAN:   Malignant neoplasm of upper-outer quadrant of right breast in female, estrogen receptor positive (HCC) 04/23/22: Right Mastectomy:  Grade 2 ILC 2.1 cm, LG DCIS, ALH, margins Neg, Benign IM LN, 0/5 LN Neg, ER > 90%, PR > 90%, HER2 negative, Ki-67 not done   Treatment Plan:  Adjuvant antiestrogen therapy with letrozole 2.5 mg daily x7 years started 06/02/2022 Delray Alt was unable to take the letrozole due to hot flashes.  She stopped the  anastrozole secondary to diarrhea.  She will start exemestane in the next few days.  Her left breast mammogram is due in November and I placed orders for this to occur.  RTC in 4 weeks for f/u and evaluation.  We discussed that if she has any issues with exemestane that she would rather go back on the letrozole and deal with the hot flashes then take tamoxifen as she got the impression that tamoxifen is suboptimal treatment.  She is also struggled with anxiety and depression and with current situational changes this is increased.  I increased her Wellbutrin XL to 300 mg daily.        All questions were answered. The patient knows to call the clinic with any problems, questions or concerns. We can certainly see the patient much sooner if necessary.  Total encounter time:30 minutes*in face-to-face visit time, chart review, lab review, care coordination, order entry, and documentation of the encounter time.  Lillard Anes, NP 08/04/23 11:24 PM Medical Oncology and Hematology [facility] [address] [place], [place] [address] Tel. [phone]    Fax. [phone]  *Total Encounter Time as defined by the Centers for Medicare and Medicaid Services includes, in addition to the face-to-face time of a patient visit (documented in the note above) non-face-to-face time: obtaining and reviewing outside history, ordering and reviewing medications, tests or procedures, care coordination (communications with other health care professionals or caregivers) and documentation in the medical record.

## 2023-08-04 NOTE — Assessment & Plan Note (Signed)
04/23/22: Right Mastectomy: Grade 2 ILC 2.1 cm, LG DCIS, ALH, margins Neg, Benign IM LN, 0/5 LN Neg, ER > 90%, PR > 90%, HER2 negative, Ki-67 not done   Treatment Plan:  Adjuvant antiestrogen therapy with letrozole 2.5 mg daily x7 years started 06/02/2022 Katelyn Ball was unable to take the letrozole due to hot flashes.  She stopped the anastrozole secondary to diarrhea.  She will start exemestane in the next few days.  Her left breast mammogram is due in November and I placed orders for this to occur.  RTC in 4 weeks for f/u and evaluation.  We discussed that if she has any issues with exemestane that she would rather go back on the letrozole and deal with the hot flashes then take tamoxifen as she got the impression that tamoxifen is suboptimal treatment.  She is also struggled with anxiety and depression and with current situational changes this is increased.  I increased her Wellbutrin XL to 300 mg daily.

## 2023-08-15 ENCOUNTER — Ambulatory Visit
Admission: RE | Admit: 2023-08-15 | Discharge: 2023-08-15 | Disposition: A | Discharge status: Home / Self Care | Payer: Medicare HMO | Source: Ambulatory Visit | Attending: Adult Health | Admitting: Adult Health

## 2023-08-15 ENCOUNTER — Ambulatory Visit: Payer: Medicare HMO

## 2023-08-15 DIAGNOSIS — C50411 Malignant neoplasm of upper-outer quadrant of right female breast: Secondary | ICD-10-CM

## 2023-08-19 ENCOUNTER — Ambulatory Visit: Payer: Medicare Other | Admitting: Adult Health

## 2023-08-24 ENCOUNTER — Other Ambulatory Visit: Payer: Self-pay | Admitting: Adult Health

## 2023-08-24 ENCOUNTER — Telehealth: Payer: Self-pay | Admitting: Adult Health

## 2023-08-24 DIAGNOSIS — Z170 Estrogen receptor positive status [ER+]: Secondary | ICD-10-CM

## 2023-08-24 NOTE — Telephone Encounter (Signed)
Rescheduled appointment per provider PAL. Patient is aware of the changes made to her upcoming appointment. 

## 2023-08-27 ENCOUNTER — Inpatient Hospital Stay: Payer: Medicare HMO | Attending: Adult Health | Admitting: Adult Health

## 2023-08-27 DIAGNOSIS — Z170 Estrogen receptor positive status [ER+]: Secondary | ICD-10-CM

## 2023-08-27 DIAGNOSIS — C50411 Malignant neoplasm of upper-outer quadrant of right female breast: Secondary | ICD-10-CM | POA: Diagnosis not present

## 2023-08-27 MED ORDER — CIPROFLOXACIN HCL 250 MG PO TABS: 250.0000 mg | ORAL_TABLET | Freq: Two times a day (BID) | ORAL | 0 refills | Status: DC

## 2023-08-27 MED ORDER — BUPROPION HCL ER (XL) 300 MG PO TB24: 300.0000 mg | ORAL_TABLET | Freq: Every day | ORAL | 3 refills | Status: AC

## 2023-08-27 MED ORDER — LETROZOLE 2.5 MG PO TABS: 2.5000 mg | ORAL_TABLET | Freq: Every day | ORAL | 3 refills | Status: DC

## 2023-08-27 NOTE — Progress Notes (Signed)
[facility] Cancer Follow up:    Katelyn Ferrier, MD [address] [facility] [place] [address]   DIAGNOSIS:  Cancer Staging  Malignant neoplasm of upper-outer quadrant of right breast in female, estrogen receptor positive (HCC) Staging form: Breast, AJCC 8th Edition - Clinical: Stage IA (cT1c, cN0, cM0, G1, ER+, PR+, HER2-) - Signed by Serena Croissant, MD on 04/04/2022 Stage prefix: Initial diagnosis Histologic grading system: 3 grade system  I connected with Katelyn Ball on 08/27/23 at  8:30 AM EDT by telephone and verified that I am speaking with the correct person using two identifiers.  I discussed the limitations, risks, security and privacy concerns of performing an evaluation and management service by telephone and the availability of in person appointments.  I also discussed with the patient that there may be a patient responsible charge related to this service. The patient expressed understanding and agreed to proceed.  Patient location: home Provider location: [facility]  SUMMARY OF ONCOLOGIC HISTORY: Oncology History  Malignant neoplasm of upper-outer quadrant of right breast in female, estrogen receptor positive (HCC)  03/26/2022 Initial Diagnosis   Screening detected right breast distortion at 12:00: 1.1 cm, axilla negative, ultrasound biopsy: Grade 1 ILC, ER greater than 90%, PR greater than 90%, HER2 negative, Ki-67 not done   04/04/2022 Cancer Staging   Staging form: Breast, AJCC 8th Edition - Clinical: Stage IA (cT1c, cN0, cM0, G1, ER+, PR+, HER2-) - Signed by Serena Croissant, MD on 04/04/2022 Stage prefix: Initial diagnosis Histologic grading system: 3 grade system    Genetic Testing   [facility] CustomNext Panel was Negative. Report date is 04/19/2022.  The CustomNext-Cancer+RNAinsight panel offered by [employer] includes sequencing and rearrangement analysis for the following 47 genes:  APC, ATM, AXIN2, BARD1, BMPR1A, BRCA1,  BRCA2, BRIP1, CDH1, CDK4, CDKN2A, CHEK2, CTNNA1, DICER1, EPCAM, GREM1, HOXB13, KIT, MEN1, MLH1, MSH2, MSH3, MSH6, MUTYH, NBN, NF1, NTHL1, PALB2, PDGFRA, PMS2, POLD1, POLE, PTEN, RAD50, RAD51C, RAD51D, SDHA, SDHB, SDHC, SDHD, SMAD4, SMARCA4, STK11, TP53, TSC1, TSC2, and VHL.  RNA data is routinely analyzed for use in variant interpretation for all genes.   04/23/2022 Surgery   Right Mastectomy: Grade 2 ILC 2.1 cm, LG DCIS, ALH, margins Neg, Benign IM LN, 0/5 LN Neg, ER > 90%, PR > 90%, HER2 negative, Ki-67 not done   06/02/2022 -  Anti-estrogen oral therapy   Letrozole 2.5 mg daily x7 years      CURRENT THERAPY: Exemestane  INTERVAL HISTORY: Katelyn Ball 77 y.o. female returns for f/u after starting Exemestane.  She has been unable to tolerate Anastrozole or Exemestane due to diarrhea.  She is requesting to go back to Letrozole because she had hot flashes, but no diarrhea.  She last had diarrhea last night.  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   She is taking Wellbutrin and recently increased to 300mg .  She is tolerating it well and thinks it helps her increased stress with her husbands illness.  Sometimes she feels a little different on it, but she isn't experiencing any palpitations, or feeling lightheaded.    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   Vitamin D deficiency 05/24/2021   Vaginal enterocele 05/24/2021   Colonic polyp  05/24/2021   GERD (gastroesophageal reflux disease) 05/24/2021   Chronic obstructive pulmonary disease, unspecified (HCC) 05/24/2021   Allergic rhinitis due to allergen 05/24/2021   Coronary artery calcification seen on CT scan 12/18/2017   Aortic atherosclerosis (HCC) 12/18/2017   Recurrent  major depressive disorder, in full remission (HCC) 10/18/2016   Diverticulosis of large intestine without hemorrhage 10/18/2015   Trochanteric bursitis of right hip 01/25/2015   Lumbar radiculitis 01/25/2015   Pure hypercholesterolemia 11/02/2014   Essential hypertension 11/02/2014   B12 deficiency 09/15/2014    is allergic to pneumococcal vaccine, sulfa antibiotics, anastrozole, ciprofloxacin, cyclobenzaprine, cymbalta [duloxetine hcl], demerol [meperidine], erythromycin, fiorinal [butalbital-aspirin-caffeine], fish oil, motrin [ibuprofen], naproxen, nitrofurantoin, other, cephalexin, and duloxetine.  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   left upper cutaneous lip   Vitamin D deficiency     SURGICAL HISTORY: Past Surgical History:  Procedure Laterality Date   ABDOMINAL HYSTERECTOMY     BACK SURGERY     BREAST BIOPSY Right 03/26/2022   u/s bx rt 12:00 5cmfn venus clip path pending   BREAST CYST  ASPIRATION Left    BREAST CYST ASPIRATION Right 09/25/2017   2 FNA's done on cysts   CHOLECYSTECTOMY     COLONOSCOPY WITH PROPOFOL N/A 10/05/2015   Procedure: COLONOSCOPY WITH PROPOFOL;  Surgeon: Scot Jun, MD;  Location: [facility] ENDOSCOPY;  Service: Endoscopy;  Laterality: N/A;   COLONOSCOPY WITH PROPOFOL N/A 04/22/2023   Procedure: COLONOSCOPY WITH PROPOFOL;  Surgeon: Regis Bill, MD;  Location: [facility] ENDOSCOPY;  Service: Endoscopy;  Laterality: N/A;   ESOPHAGOGASTRODUODENOSCOPY (EGD) WITH PROPOFOL N/A 10/05/2015   Procedure: ESOPHAGOGASTRODUODENOSCOPY (EGD) WITH PROPOFOL;  Surgeon: Scot Jun, MD;  Location: [facility] ENDOSCOPY;  Service: Endoscopy;  Laterality: N/A;   JOINT REPLACEMENT Bilateral 11/2012   LAMINECTOMY AND MICRODISCECTOMY LUMBAR SPINE     MASTECTOMY     MASTECTOMY W/ SENTINEL NODE BIOPSY Right 04/23/2022   Procedure: RIGHT MASTECTOMY;  Surgeon: Griselda Miner, MD;  Location: [facility] OR;  Service: General;  Laterality: Right;   PILONIDAL CYST DRAINAGE     SENTINEL NODE BIOPSY Right 04/23/2022   Procedure: RIGHT SENTINEL NODE BIOPSY;  Surgeon: Griselda Miner, MD;  Location: [facility] OR;  Service: General;  Laterality: Right;   TONSILLECTOMY      SOCIAL HISTORY: Social History   Socioeconomic History   Marital status: Married  Spouse name: Not on file   Number of children: Not on file   Years of education: Not on file   Highest education level: Not on file  Occupational History   Not on file  Tobacco Use   Smoking status: Former    Current packs/day: 0.00    Average packs/day: 0.5 packs/day for 40.0 years (20.0 ttl pk-yrs)    Types: Cigarettes    Start date: 10/03/1968    Quit date: 10/03/2008    Years since quitting: 14.9   Smokeless tobacco: Never  Vaping Use   Vaping status: Never Used  Substance and Sexual Activity   Alcohol use: No   Drug use: No   Sexual activity: Not on file  Other Topics Concern   Not on file  Social History Narrative   Not  on file   Social Determinants of Health   Financial Resource Strain: Low Risk  (05/12/2023)   Received from [facility], [facility]   Overall Financial Resource Strain (CARDIA)    Difficulty of Paying Living Expenses: Not hard at all  Food Insecurity: No Food Insecurity (05/12/2023)   Received from [facility], [facility]   Hunger Vital Sign    Worried About Running Out of Food in the Last Year: Never true    Ran Out of Food in the Last Year: Never true  Transportation Needs: No Transportation Needs (05/12/2023)   Received from [facility], [facility]   [facility] - Transportation    In the past 12 months, has lack of transportation kept you from medical appointments or from getting medications?: No    Lack of Transportation (Non-Medical): No  Physical Activity: Not on file  Stress: Not on file  Social Connections: Not on file  Intimate Partner Violence: Not on file    FAMILY HISTORY: Family History  Problem Relation Age of Onset   Rectal cancer Mother 31   Prostate cancer Father        dx. 72s, metastatic   Lymphoma Maternal Uncle    Breast cancer Neg Hx     Review of Systems  Constitutional:  Negative for appetite change, chills, fatigue, fever and unexpected weight change.  HENT:   Negative for hearing loss, lump/mass and trouble swallowing.   Eyes:  Negative for eye problems and icterus.  Respiratory:  Negative for chest tightness, cough and shortness of breath.   Cardiovascular:  Negative for chest pain, leg swelling and palpitations.  Gastrointestinal:  Positive for diarrhea. Negative for abdominal distention, abdominal pain, constipation, nausea and vomiting.  Endocrine: Negative for hot flashes.  Genitourinary:  Negative for difficulty urinating.   Musculoskeletal:  Negative for arthralgias.  Skin:  Negative for itching and rash.  Neurological:  Negative for  dizziness, extremity weakness, headaches and numbness.  Hematological:  Negative for adenopathy. Does not bruise/bleed easily.  Psychiatric/Behavioral:  Negative for depression. The patient is not nervous/anxious.   All other systems reviewed and are negative.     PHYSICAL EXAMINATION  Patient sounds well.  She is in no apparent distress.   ASSESSMENT and THERAPY PLAN:   Malignant neoplasm of upper-outer quadrant of right breast in female, estrogen receptor positive (HCC) 04/23/22: Right Mastectomy: Grade 2 ILC 2.1 cm, LG DCIS, ALH, margins Neg, Benign IM LN, 0/5 LN Neg, ER > 90%, PR > 90%, HER2 negative, Ki-67 not done   Treatment Plan:  Adjuvant antiestrogen therapy with letrozole 2.5 mg daily  x7 years started 06/02/2022 Delray Alt was unable to take the letrozole due to hot flashes.  She developed colitis with anastrozole and has subsequently developed diarrhea with exemestane therefore she stopped it.  The only thing that resolved her diarrhea with the anastrozole was Cipro.  She is going to take Cipro 250 mg p.o. twice daily for 1 week.  She tells me she tolerated this just fine despite the chart saying she had a reaction to this in the past. She is going to restart letrozole after she has not had any diarrhea for 2 weeks. She is going to continue on Wellbutrin 300 mg daily.  I suggested she check her blood pressure on occasion while she is at home which she agreed that she will do.  Her left breast mammogram is due in November and has been ordered.  RTC in 4 weeks for f/u and evaluation.         Follow up instructions:    -Return to cancer center 4 weeks for virtual f/u  The patient was provided an opportunity to ask questions and all were answered. The patient agreed with the plan and demonstrated an understanding of the instructions.   The patient was advised to call back or seek an in-person evaluation if the symptoms worsen or if the condition fails to improve as  anticipated.   I provided 15 minutes of non face-to-face telephone visit time during this encounter, and > 50% was spent counseling as documented under my assessment & plan.   Lillard Anes, NP 08/27/23 9:07 AM Medical Oncology and Hematology [facility] [address] [place], [place] [address] Tel. [phone]    Fax. [phone]  *Total Encounter Time as defined by the Centers for Medicare and Medicaid Services includes, in addition to the face-to-face time of a patient visit (documented in the note above) non-face-to-face time: obtaining and reviewing outside history, ordering and reviewing medications, tests or procedures, care coordination (communications with other health care professionals or caregivers) and documentation in the medical record.

## 2023-08-27 NOTE — Assessment & Plan Note (Signed)
04/23/22: Right Mastectomy: Grade 2 ILC 2.1 cm, LG DCIS, ALH, margins Neg, Benign IM LN, 0/5 LN Neg, ER > 90%, PR > 90%, HER2 negative, Ki-67 not done   Treatment Plan:  Adjuvant antiestrogen therapy with letrozole 2.5 mg daily x7 years started 06/02/2022 Katelyn Ball was unable to take the letrozole due to hot flashes.  She developed colitis with anastrozole and has subsequently developed diarrhea with exemestane therefore she stopped it.  The only thing that resolved her diarrhea with the anastrozole was Cipro.  She is going to take Cipro 250 mg p.o. twice daily for 1 week.  She tells me she tolerated this just fine despite the chart saying she had a reaction to this in the past. She is going to restart letrozole after she has not had any diarrhea for 2 weeks. She is going to continue on Wellbutrin 300 mg daily.  I suggested she check her blood pressure on occasion while she is at home which she agreed that she will do.  Her left breast mammogram is due in November and has been ordered.  RTC in 4 weeks for f/u and evaluation.

## 2023-08-28 ENCOUNTER — Telehealth: Payer: Self-pay | Admitting: Adult Health

## 2023-08-28 NOTE — Telephone Encounter (Signed)
Per 10/8 los left patient a message in regards to scheduled follow up appointment times/dates left callback number as well if needed for rescheduling

## 2023-08-30 ENCOUNTER — Inpatient Hospital Stay: Payer: Medicare HMO | Admitting: Adult Health

## 2023-09-25 ENCOUNTER — Inpatient Hospital Stay: Payer: Medicare HMO | Attending: Adult Health | Admitting: Adult Health

## 2023-09-25 DIAGNOSIS — Z170 Estrogen receptor positive status [ER+]: Secondary | ICD-10-CM

## 2023-09-25 DIAGNOSIS — C50411 Malignant neoplasm of upper-outer quadrant of right female breast: Secondary | ICD-10-CM | POA: Diagnosis not present

## 2023-09-25 NOTE — Progress Notes (Signed)
Cancer Center Cancer Follow up:    [name], MD [address] [facility] [place] [address]   DIAGNOSIS:  Cancer Staging  Malignant neoplasm of upper-outer quadrant of right breast in female, estrogen receptor positive (HCC) Staging form: Breast, AJCC 8th Edition - Clinical: Stage IA (cT1c, cN0, cM0, G1, ER+, PR+, HER2-) - Signed by Serena Croissant, MD on 04/04/2022 Stage prefix: Initial diagnosis Histologic grading system: 3 grade system  I connected with Katelyn Ball on 09/25/23 at  8:30 AM EST by telephone and verified that I am speaking with the correct person using two identifiers.  I discussed the limitations, risks, security and privacy concerns of performing an evaluation and management service by telephone and the availability of in person appointments.  I also discussed with the patient that there may be a patient responsible charge related to this service. The patient expressed understanding and agreed to proceed.  Patient location: home Provider location: [facility] office  SUMMARY OF ONCOLOGIC HISTORY: Oncology History  Malignant neoplasm of upper-outer quadrant of right breast in female, estrogen receptor positive (HCC)  03/26/2022 Initial Diagnosis   Screening detected right breast distortion at 12:00: 1.1 cm, axilla negative, ultrasound biopsy: Grade 1 ILC, ER greater than 90%, PR greater than 90%, HER2 negative, Ki-67 not done   04/04/2022 Cancer Staging   Staging form: Breast, AJCC 8th Edition - Clinical: Stage IA (cT1c, cN0, cM0, G1, ER+, PR+, HER2-) - Signed by Serena Croissant, MD on 04/04/2022 Stage prefix: Initial diagnosis Histologic grading system: 3 grade system    Genetic Testing   [facility] CustomNext Panel was Negative. Report date is 04/19/2022.  The CustomNext-Cancer+RNAinsight panel offered by [employer] includes sequencing and rearrangement analysis for the following 47 genes:  APC, ATM, AXIN2, BARD1, BMPR1A, BRCA1,  BRCA2, BRIP1, CDH1, CDK4, CDKN2A, CHEK2, CTNNA1, DICER1, EPCAM, GREM1, HOXB13, KIT, MEN1, MLH1, MSH2, MSH3, MSH6, MUTYH, NBN, NF1, NTHL1, PALB2, PDGFRA, PMS2, POLD1, POLE, PTEN, RAD50, RAD51C, RAD51D, SDHA, SDHB, SDHC, SDHD, SMAD4, SMARCA4, STK11, TP53, TSC1, TSC2, and VHL.  RNA data is routinely analyzed for use in variant interpretation for all genes.   04/23/2022 Surgery   Right Mastectomy: Grade 2 ILC 2.1 cm, LG DCIS, ALH, margins Neg, Benign IM LN, 0/5 LN Neg, ER > 90%, PR > 90%, HER2 negative, Ki-67 not done   06/02/2022 -  Anti-estrogen oral therapy   Letrozole 2.5 mg daily x7 years      CURRENT THERAPY: to restart letrozole  INTERVAL HISTORY:  Discussed the use of AI scribe software for clinical note transcription with the patient, who gave verbal consent to proceed.  Katelyn Ball 77 y.o. female returns for f/u of her breast cancer.  At her last appointment we reviewed that Delray Alt was unable to take the letrozole due to hot flashes.  She developed colitis with anastrozole and has subsequently developed diarrhea with exemestane therefore she stopped it.  She opted to restart the letrozole, however has not yet due to wanting my opinion on her intermittent diarrhea.  She has continued to experience ongoing intermittent diarrhea. They report that the diarrhea is not as severe as it was previously, and they may have a day or two without any episodes. However, they still experience bouts of diarrhea, which she manages with Imodium. She have started to consider this as their new normal.  The patient has noticed that dairy products, particularly ice cream, cause reflux and sickness. They have been able to tolerate cheese,  but are open to trying non-dairy alternatives to see if this helps manage their diarrhea.  They have not yet started on letrozole, a medication recommended for their breast cancer, but express a desire to begin the treatment. They have previously experienced hot flashes as a  side effect of their cancer treatment, but these have recently subsided.  The patient also mentions a previous mammogram and ultrasound, where the doctor recommended an MRI of the other breast due to an area of concern. The patient is awaiting further instructions regarding this.   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   Vitamin D deficiency 05/24/2021   Vaginal enterocele 05/24/2021   Colonic polyp 05/24/2021   GERD (gastroesophageal reflux disease) 05/24/2021   Chronic obstructive pulmonary disease, unspecified (HCC) 05/24/2021   Allergic rhinitis due to allergen 05/24/2021   Coronary artery calcification seen on CT scan 12/18/2017   Aortic atherosclerosis (HCC) 12/18/2017   Recurrent major depressive disorder, in full remission (HCC) 10/18/2016   Diverticulosis of large intestine without hemorrhage 10/18/2015   Trochanteric bursitis of right hip 01/25/2015   Lumbar radiculitis 01/25/2015   Pure hypercholesterolemia 11/02/2014   Essential hypertension 11/02/2014   B12 deficiency 09/15/2014    is allergic to pneumococcal vaccine, sulfa antibiotics, anastrozole, ciprofloxacin, cyclobenzaprine, cymbalta [duloxetine hcl], demerol [meperidine], erythromycin, fiorinal [butalbital-aspirin-caffeine], fish oil, motrin [ibuprofen], naproxen, nitrofurantoin, other, cephalexin, and duloxetine.  MEDICAL HISTORY: Past Medical History:  Diagnosis Date   Actinic keratosis    Anemia    Anxiety    Asthma    Atelectasis    B12 deficiency    Cancer (HCC)    skin   COPD (chronic obstructive pulmonary disease) (HCC)    DDD (degenerative disc disease), cervical    Dysrhythmia    GERD (gastroesophageal reflux disease)    History of chicken pox    Hyperlipidemia     Hypertension    Hypothyroidism    Iritis    Meniere disease    PONV (postoperative nausea and vomiting)    Pre-diabetes    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   right upper arm, ATYPICAL SQUAMOUS PROLIFERATION, 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, re-biopsied and treated with [facility] 11/15/21   SCC (squamous cell carcinoma) 06/26/2023   L lower distal anterior leg, referred to [facility]   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   left upper cutaneous lip   Vitamin D deficiency     SURGICAL HISTORY: Past Surgical History:  Procedure Laterality Date   ABDOMINAL HYSTERECTOMY     BACK SURGERY     BREAST BIOPSY Right 03/26/2022   u/s bx rt 12:00 5cmfn venus clip path pending   BREAST CYST ASPIRATION Left    BREAST CYST ASPIRATION Right 09/25/2017   2 FNA's done on cysts   CHOLECYSTECTOMY     COLONOSCOPY WITH PROPOFOL N/A 10/05/2015   Procedure: COLONOSCOPY WITH PROPOFOL;  Surgeon: Scot Jun, MD;  Location: [facility] ENDOSCOPY;  Service: Endoscopy;  Laterality: N/A;   COLONOSCOPY WITH PROPOFOL N/A 04/22/2023   Procedure: COLONOSCOPY WITH PROPOFOL;  Surgeon: Regis Bill,  MD;  Location: [facility] ENDOSCOPY;  Service: Endoscopy;  Laterality: N/A;   ESOPHAGOGASTRODUODENOSCOPY (EGD) WITH PROPOFOL N/A 10/05/2015   Procedure: ESOPHAGOGASTRODUODENOSCOPY (EGD) WITH PROPOFOL;  Surgeon: Scot Jun, MD;  Location: [facility] ENDOSCOPY;  Service: Endoscopy;  Laterality: N/A;   JOINT REPLACEMENT Bilateral 11/2012   LAMINECTOMY AND MICRODISCECTOMY LUMBAR SPINE     MASTECTOMY     MASTECTOMY W/ SENTINEL NODE BIOPSY Right 04/23/2022   Procedure: RIGHT MASTECTOMY;  Surgeon: Griselda Miner, MD;  Location: [facility] OR;  Service: General;  Laterality: Right;   PILONIDAL CYST DRAINAGE      SENTINEL NODE BIOPSY Right 04/23/2022   Procedure: RIGHT SENTINEL NODE BIOPSY;  Surgeon: Griselda Miner, MD;  Location: [facility] OR;  Service: General;  Laterality: Right;   TONSILLECTOMY      SOCIAL HISTORY: Social History   Socioeconomic History   Marital status: Married    Spouse name: Not on file   Number of children: Not on file   Years of education: Not on file   Highest education level: Not on file  Occupational History   Not on file  Tobacco Use   Smoking status: Former    Current packs/day: 0.00    Average packs/day: 0.5 packs/day for 40.0 years (20.0 ttl pk-yrs)    Types: Cigarettes    Start date: 10/03/1968    Quit date: 10/03/2008    Years since quitting: 14.9   Smokeless tobacco: Never  Vaping Use   Vaping status: Never Used  Substance and Sexual Activity   Alcohol use: No   Drug use: No   Sexual activity: Not on file  Other Topics Concern   Not on file  Social History Narrative   Not on file   Social Determinants of Health   Financial Resource Strain: Low Risk  (05/12/2023)   Received from [facility], [facility]   Overall Financial Resource Strain (CARDIA)    Difficulty of Paying Living Expenses: Not hard at all  Food Insecurity: No Food Insecurity (05/12/2023)   Received from [facility], [facility]   Hunger Vital Sign    Worried About Running Out of Food in the Last Year: Never true    Ran Out of Food in the Last Year: Never true  Transportation Needs: No Transportation Needs (05/12/2023)   Received from [facility], [facility]   [facility] - Transportation    In the past 12 months, has lack of transportation kept you from medical appointments or from getting medications?: No    Lack of Transportation (Non-Medical): No  Physical Activity: Not on file  Stress: Not on file  Social Connections: Not on file  Intimate Partner Violence: Not on file     FAMILY HISTORY: Family History  Problem Relation Age of Onset   Rectal cancer Mother 103   Prostate cancer Father        dx. 109s, metastatic   Lymphoma Maternal Uncle    Breast cancer Neg Hx     Review of Systems  Constitutional:  Negative for appetite change, chills, fatigue, fever and unexpected weight change.  HENT:   Negative for hearing loss, lump/mass and trouble swallowing.   Eyes:  Negative for eye problems and icterus.  Respiratory:  Negative for chest tightness, cough and shortness of breath.   Cardiovascular:  Negative for chest pain, leg swelling and palpitations.  Gastrointestinal:  Positive for diarrhea. Negative for abdominal distention, abdominal pain, blood in  stool, constipation, nausea, rectal pain and vomiting.  Endocrine: Negative for hot flashes.  Genitourinary:  Negative for difficulty urinating.   Musculoskeletal:  Negative for arthralgias.  Skin:  Negative for itching and rash.  Neurological:  Negative for dizziness, extremity weakness, headaches and numbness.  Hematological:  Negative for adenopathy. Does not bruise/bleed easily.  Psychiatric/Behavioral:  Negative for depression. The patient is not nervous/anxious.       PHYSICAL EXAMINATION Patient sounds well.  In no apparent distress, mood and behavior are normal, speech is normal   ASSESSMENT and THERAPY PLAN:   Malignant neoplasm of upper-outer quadrant of right breast in female, estrogen receptor positive (HCC) 04/23/22: Right Mastectomy: Grade 2 ILC 2.1 cm, LG DCIS, ALH, margins Neg, Benign IM LN, 0/5 LN Neg, ER > 90%, PR > 90%, HER2 negative, Ki-67 not done   Treatment Plan:  Adjuvant antiestrogen therapy with letrozole 2.5 mg daily x7 years started 06/02/2022 Delray Alt was unable to take the letrozole due to hot flashes.  She developed colitis with anastrozole and has subsequently developed diarrhea with exemestane therefore she stopped it.  The only thing that resolved her diarrhea with the  anastrozole was Cipro.  Breast Cancer Patient has not yet started Letrozole but is willing to try it. No current hot flashes reported. -Start Letrozole. -Plan to touch base in two months to assess response and side effects.  Gastrointestinal Disturbance Intermittent diarrhea, possibly related to dietary intake. Patient reports intolerance to dairy products. -Recommend dietary modifications, specifically avoiding dairy products. -Consider a trial of a whole food, plant-based diet with increased fiber.  Breast Imaging Follow-up Previous mammogram and ultrasound showed an area of concern in the contralateral breast. Next MRI due in May 2025. -Plan to order MRI at the next follow-up in January per radiology recommendations for it to be completed in 03/2024.      Follow up instructions:    -Return to cancer center in 11/2023 for f/u  -Mammogram due 07/2024 -Breast MRI due 03/2024   The patient was provided an opportunity to ask questions and all were answered. The patient agreed with the plan and demonstrated an understanding of the instructions.   The patient was advised to call back or seek an in-person evaluation if the symptoms worsen or if the condition fails to improve as anticipated.   I provided 15 minutes of non face-to-face telephone visit time during this encounter, and > 50% was spent counseling as documented under my assessment & plan.  Lillard Anes, NP 09/25/23 8:43 AM Medical Oncology and Hematology [facility] [address] [place], [place] [address] Tel. [phone]    Fax. [phone]

## 2023-09-25 NOTE — Assessment & Plan Note (Signed)
04/23/22: Right Mastectomy: Grade 2 ILC 2.1 cm, LG DCIS, ALH, margins Neg, Benign IM LN, 0/5 LN Neg, ER > 90%, PR > 90%, HER2 negative, Ki-67 not done   Treatment Plan:  Adjuvant antiestrogen therapy with letrozole 2.5 mg daily x7 years started 06/02/2022 Delray Alt was unable to take the letrozole due to hot flashes.  She developed colitis with anastrozole and has subsequently developed diarrhea with exemestane therefore she stopped it.  The only thing that resolved her diarrhea with the anastrozole was Cipro.  Breast Cancer Patient has not yet started Letrozole but is willing to try it. No current hot flashes reported. -Start Letrozole. -Plan to touch base in two months to assess response and side effects.  Gastrointestinal Disturbance Intermittent diarrhea, possibly related to dietary intake. Patient reports intolerance to dairy products. -Recommend dietary modifications, specifically avoiding dairy products. -Consider a trial of a whole food, plant-based diet with increased fiber.  Breast Imaging Follow-up Previous mammogram and ultrasound showed an area of concern in the contralateral breast. Next MRI due in May 2025. -Plan to order MRI at the next follow-up in January per radiology recommendations for it to be completed in 03/2024.

## 2023-09-26 ENCOUNTER — Telehealth: Payer: Self-pay | Admitting: Adult Health

## 2023-09-26 NOTE — Telephone Encounter (Signed)
Spoke with patient confirming upcoming appointment  

## 2023-10-14 ENCOUNTER — Ambulatory Visit: Payer: Medicare HMO | Attending: General Surgery

## 2023-10-14 DIAGNOSIS — Z483 Aftercare following surgery for neoplasm: Secondary | ICD-10-CM | POA: Insufficient documentation

## 2023-11-04 ENCOUNTER — Encounter: Payer: Self-pay | Admitting: Physical Therapy

## 2023-11-04 ENCOUNTER — Ambulatory Visit: Payer: Medicare HMO | Attending: General Surgery | Admitting: Physical Therapy

## 2023-11-04 DIAGNOSIS — Z483 Aftercare following surgery for neoplasm: Secondary | ICD-10-CM | POA: Insufficient documentation

## 2023-11-04 NOTE — Therapy (Signed)
OUTPATIENT PHYSICAL THERAPY SOZO SCREENING NOTE   Patient Name: Katelyn Ball MRN: [id number] DOB:27-Jan-1946, 77 y.o., female Today's Date: 11/04/2023  PCP: [name], MD REFERRING PROVIDER: Griselda Miner, MD   PT End of Session - 11/04/23 1159     Visit Number 1    PT Start Time 1040    PT Stop Time 1047   Screen performed by Guy Sandifer, Support Rep II   PT Time Calculation (min) 7 min    Activity Tolerance Patient tolerated treatment well    Behavior During Therapy [facility] for tasks assessed/performed             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   right upper arm, ATYPICAL SQUAMOUS PROLIFERATION, 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, re-biopsied and treated with [facility] 11/15/21   SCC (squamous cell carcinoma) 06/26/2023   L lower distal anterior leg, referred to [facility]   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   left upper cutaneous lip   Vitamin D deficiency    Past Surgical History:  Procedure Laterality Date   ABDOMINAL HYSTERECTOMY     BACK SURGERY     BREAST BIOPSY Right 03/26/2022   u/s bx rt 12:00 5cmfn venus clip path pending   BREAST CYST ASPIRATION Left    BREAST CYST ASPIRATION Right 09/25/2017   2 FNA's done on cysts    CHOLECYSTECTOMY     COLONOSCOPY WITH PROPOFOL N/A 10/05/2015   Procedure: COLONOSCOPY WITH PROPOFOL;  Surgeon: Scot Jun, MD;  Location[facility] ENDOSCOPY;  Service: Endoscopy;  Laterality: N/A;   COLONOSCOPY WITH PROPOFOL N/A 04/22/2023   Procedure: COLONOSCOPY WITH PROPOFOL;  Surgeon: Regis Bill, MD;  Location: [facility] ENDOSCOPY;  Service: Endoscopy;  Laterality: N/A;   ESOPHAGOGASTRODUODENOSCOPY (EGD) WITH PROPOFOL N/A 10/05/2015   Procedure: ESOPHAGOGASTRODUODENOSCOPY (EGD) WITH PROPOFOL;  Surgeon: Scot Jun, MD;  Location: [facility] ENDOSCOPY;  Service: Endoscopy;  Laterality: N/A;   JOINT REPLACEMENT Bilateral 11/2012   LAMINECTOMY AND MICRODISCECTOMY LUMBAR SPINE     MASTECTOMY     MASTECTOMY W/ SENTINEL NODE BIOPSY Right 04/23/2022   Procedure: RIGHT MASTECTOMY;  Surgeon: Griselda Miner, MD;  Location: [facility] OR;  Service: General;  Laterality: Right;   PILONIDAL CYST DRAINAGE     SENTINEL NODE BIOPSY Right 04/23/2022   Procedure: RIGHT SENTINEL NODE BIOPSY;  Surgeon: Griselda Miner, MD;  Location: [facility] OR;  Service: General;  Laterality: Right;   TONSILLECTOMY     Patient Active Problem List   Diagnosis Date Noted   Genetic testing 04/20/2022   Malignant neoplasm of upper-outer quadrant of right breast in female, estrogen  receptor positive (HCC) 04/04/2022   Prediabetes 03/27/2022   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   Vitamin D deficiency 05/24/2021   Vaginal enterocele 05/24/2021   Colonic polyp 05/24/2021   GERD (gastroesophageal reflux disease) 05/24/2021   Chronic obstructive pulmonary disease, unspecified (HCC) 05/24/2021   Allergic rhinitis due to allergen 05/24/2021   Coronary artery calcification seen on CT scan 12/18/2017   Aortic atherosclerosis (HCC) 12/18/2017   Recurrent major depressive disorder, in full remission (HCC) 10/18/2016   Diverticulosis of large intestine without hemorrhage 10/18/2015   Trochanteric bursitis of right hip 01/25/2015    Lumbar radiculitis 01/25/2015   Pure hypercholesterolemia 11/02/2014   Essential hypertension 11/02/2014   B12 deficiency 09/15/2014    REFERRING DIAG: right breast cancer at risk for lymphedema  THERAPY DIAG:  Aftercare following surgery for neoplasm  PERTINENT HISTORY: Patient was diagnosed on 02/19/2022 with 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 She underwent a right mastectomy and sentinel node biopsy (5 negative nodes removed) on 04/23/2022. It is ER/PR positive and HER2 negative with an unknown Ki67.   PRECAUTIONS: right UE Lymphedema risk  SUBJECTIVE: Pt here for SOZO screen  PAIN:  Are you having pain? No  SOZO SCREENING: Patient was assessed today using the SOZO machine to determine the lymphedema index score. This was compared to her baseline score. It was determined that she is within the recommended range when compared to her baseline and no further action is needed at this time. She will continue SOZO screenings. These are done every 3 months for 2 years post operatively followed by every 6 months for 2 years, and then annually.   L-DEX FLOWSHEETS - 11/04/23 1200       L-DEX LYMPHEDEMA SCREENING   Measurement Type Unilateral    L-DEX MEASUREMENT EXTREMITY Upper Extremity    POSITION  Standing    DOMINANT SIDE Right    At Risk Side Right    BASELINE SCORE (UNILATERAL) -0.3    L-DEX SCORE (UNILATERAL) 1.7    VALUE CHANGE (UNILAT) 2             Bethann Punches, PT 11/04/23 12:00 PM

## 2023-11-26 ENCOUNTER — Inpatient Hospital Stay: Payer: Medicare Other | Attending: Adult Health | Admitting: Adult Health

## 2023-11-26 ENCOUNTER — Encounter: Payer: Self-pay | Admitting: Adult Health

## 2023-11-26 VITALS — BP 144/61 | HR 62 | Temp 97.0°F | Resp 18 | Wt 183.3 lb

## 2023-11-26 DIAGNOSIS — Z87891 Personal history of nicotine dependence: Secondary | ICD-10-CM | POA: Insufficient documentation

## 2023-11-26 DIAGNOSIS — C50411 Malignant neoplasm of upper-outer quadrant of right female breast: Secondary | ICD-10-CM | POA: Diagnosis not present

## 2023-11-26 DIAGNOSIS — Z170 Estrogen receptor positive status [ER+]: Secondary | ICD-10-CM | POA: Insufficient documentation

## 2023-11-26 DIAGNOSIS — Z79811 Long term (current) use of aromatase inhibitors: Secondary | ICD-10-CM | POA: Diagnosis not present

## 2023-11-26 DIAGNOSIS — Z9011 Acquired absence of right breast and nipple: Secondary | ICD-10-CM | POA: Insufficient documentation

## 2023-11-26 MED ORDER — TAMOXIFEN CITRATE 10 MG PO TABS: 10.0000 mg | ORAL_TABLET | Freq: Every day | ORAL | 0 refills | Status: DC

## 2023-11-26 NOTE — Addendum Note (Signed)
 Addended by: Noreene Filbert on: 11/26/2023 04:06 PM   Modules accepted: Level of Service

## 2023-11-26 NOTE — Assessment & Plan Note (Addendum)
 04/23/22: Right Mastectomy: Grade 2 ILC 2.1 cm, LG DCIS, ALH, margins Neg, Benign IM LN, 0/5 LN Neg, ER > 90%, PR > 90%, HER2 negative, Ki-67 not done   Treatment Plan:  Adjuvant antiestrogen therapy with letrozole  2.5 mg daily x7 years started 06/02/2022 Lebron was unable to take the letrozole  due to hot flashes.  She developed colitis with anastrozole  and has subsequently developed diarrhea with exemestane  therefore she stopped it.  The only thing that resolved her diarrhea with the anastrozole  was Cipro .  Breast Cancer Patient experienced severe headaches and insomnia while on Letrozole . Previous trials of Anastrozole  and another unspecified aromatase inhibitor resulted in diarrhea. Discussed the mechanism, potential side effects, and risks of Tamoxifen , a selective estrogen receptor modulator. -Start Tamoxifen  at 10mg  daily, with a plan to increase to 20mg  daily if tolerated. -Continue Wellbutrin  as it does not significantly interact with Tamoxifen .  Breast Cancer Surveillance Confusion regarding the timing and necessity of breast MRI. The patient has had a previous MRI-guided biopsy. -Order breast MRI and await insurance approval. If not approved, plan for MRI in May.  Lifestyle Modification Discussed the importance of diet and exercise in reducing the risk of breast cancer recurrence. The patient is already making some changes towards a healthier diet. -Encourage continued efforts towards a healthier diet and regular exercise.  Follow-up -Schedule a follow-up appointment with Dr. Gudina in three months to assess tolerance to Tamoxifen  and discuss further management.

## 2023-11-26 NOTE — Progress Notes (Addendum)
 [facility] Cancer Follow up:    Fernande Ophelia JINNY DOUGLAS, MD [address] [facility] [place] [address]   DIAGNOSIS:  Cancer Staging  Malignant neoplasm of upper-outer quadrant of right breast in female, estrogen receptor positive (HCC) Staging form: Breast, AJCC 8th Edition - Clinical: Stage IA (cT1c, cN0, cM0, G1, ER+, PR+, HER2-) - Signed by Odean Potts, MD on 04/04/2022 Stage prefix: Initial diagnosis Histologic grading system: 3 grade system   SUMMARY OF ONCOLOGIC HISTORY: Oncology History  Malignant neoplasm of upper-outer quadrant of right breast in female, estrogen receptor positive (HCC)  03/26/2022 Initial Diagnosis   Screening detected right breast distortion at 12:00: 1.1 cm, axilla negative, ultrasound biopsy: Grade 1 ILC, ER greater than 90%, PR greater than 90%, HER2 negative, Ki-67 not done   04/04/2022 Cancer Staging   Staging form: Breast, AJCC 8th Edition - Clinical: Stage IA (cT1c, cN0, cM0, G1, ER+, PR+, HER2-) - Signed by Odean Potts, MD on 04/04/2022 Stage prefix: Initial diagnosis Histologic grading system: 3 grade system    Genetic Testing   [facility] CustomNext Panel was Negative. Report date is 04/19/2022.  The CustomNext-Cancer+RNAinsight panel offered by [employer] includes sequencing and rearrangement analysis for the following 47 genes:  APC, ATM, AXIN2, BARD1, BMPR1A, BRCA1, BRCA2, BRIP1, CDH1, CDK4, CDKN2A, CHEK2, CTNNA1, DICER1, EPCAM, GREM1, HOXB13, KIT, MEN1, MLH1, MSH2, MSH3, MSH6, MUTYH, NBN, NF1, NTHL1, PALB2, PDGFRA, PMS2, POLD1, POLE, PTEN, RAD50, RAD51C, RAD51D, SDHA, SDHB, SDHC, SDHD, SMAD4, SMARCA4, STK11, TP53, TSC1, TSC2, and VHL.  RNA data is routinely analyzed for use in variant interpretation for all genes.   04/23/2022 Surgery   Right Mastectomy: Grade 2 ILC 2.1 cm, LG DCIS, ALH, margins Neg, Benign IM LN, 0/5 LN Neg, ER > 90%, PR > 90%, HER2 negative, Ki-67 not done   06/02/2022 -  Anti-estrogen oral therapy    Letrozole  2.5 mg daily x7 years      CURRENT THERAPY: Letrozole   INTERVAL HISTORY:  Discussed the use of AI scribe software for clinical note transcription with the patient, who gave verbal consent to proceed.  Katelyn Ball Katelyn Ball 78 y.o. female with a history of breast cancer, presents with complaints of severe headaches and insomnia, which started after restarting letrozole . The patient reports that the headaches were persistent and not relieved by usual migraine medications. The insomnia was severe enough to cause frequent awakenings during the night, despite taking gabapentin  and a sleeping pill. The patient reports that these symptoms lasted for about two weeks before gradually subsiding. The patient has a history of migraines, but describes these headaches as different and more severe. Symptoms resolved after discontinuing Letrozole . The patient also mentions having an upcoming MRI for a suspected breast abnormality.   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   Vitamin D deficiency 05/24/2021   Vaginal enterocele 05/24/2021   Colonic polyp 05/24/2021   GERD (gastroesophageal reflux disease) 05/24/2021   Chronic obstructive pulmonary disease, unspecified (HCC) 05/24/2021   Allergic rhinitis due to allergen 05/24/2021   Coronary artery calcification seen on CT scan 12/18/2017   Aortic atherosclerosis (HCC) 12/18/2017   Recurrent major depressive disorder, in full remission (HCC) 10/18/2016   Diverticulosis of large intestine without hemorrhage 10/18/2015   Trochanteric bursitis of right hip 01/25/2015  Lumbar radiculitis 01/25/2015   Pure hypercholesterolemia 11/02/2014   Essential hypertension 11/02/2014   B12 deficiency 09/15/2014    is allergic to  pneumococcal vaccine, sulfa  antibiotics, anastrozole , ciprofloxacin , cyclobenzaprine, cymbalta [duloxetine hcl], demerol [meperidine], erythromycin, fiorinal [butalbital-aspirin-caffeine], fish oil, motrin [ibuprofen], naproxen, nitrofurantoin, other, cephalexin , and duloxetine.  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   left upper cutaneous lip   Vitamin D deficiency     SURGICAL HISTORY: Past Surgical History:  Procedure Laterality Date   ABDOMINAL HYSTERECTOMY     BACK SURGERY     BREAST BIOPSY Right 03/26/2022   u/s bx rt 12:00 5cmfn Katelyn clip path pending   BREAST CYST ASPIRATION Left    BREAST CYST ASPIRATION Right 09/25/2017   2 FNA's done on cysts   CHOLECYSTECTOMY     COLONOSCOPY WITH PROPOFOL  N/A 10/05/2015   Procedure: COLONOSCOPY WITH PROPOFOL ;  Surgeon: Lamar ONEIDA Holmes, MD;  Location: [facility] ENDOSCOPY;  Service: Endoscopy;  Laterality: N/A;   COLONOSCOPY WITH PROPOFOL  N/A 04/22/2023    Procedure: COLONOSCOPY WITH PROPOFOL ;  Surgeon: Maryruth Ole ONEIDA, MD;  Location: [facility] ENDOSCOPY;  Service: Endoscopy;  Laterality: N/A;   ESOPHAGOGASTRODUODENOSCOPY (EGD) WITH PROPOFOL  N/A 10/05/2015   Procedure: ESOPHAGOGASTRODUODENOSCOPY (EGD) WITH PROPOFOL ;  Surgeon: Lamar ONEIDA Holmes, MD;  Location: [facility] ENDOSCOPY;  Service: Endoscopy;  Laterality: N/A;   JOINT REPLACEMENT Bilateral 11/2012   LAMINECTOMY AND MICRODISCECTOMY LUMBAR SPINE     MASTECTOMY     MASTECTOMY W/ SENTINEL NODE BIOPSY Right 04/23/2022   Procedure: RIGHT MASTECTOMY;  Surgeon: Curvin Deward MOULD, MD;  Location: [facility] OR;  Service: General;  Laterality: Right;   PILONIDAL CYST DRAINAGE     SENTINEL NODE BIOPSY Right 04/23/2022   Procedure: RIGHT SENTINEL NODE BIOPSY;  Surgeon: Curvin Deward MOULD, MD;  Location: [facility] OR;  Service: General;  Laterality: Right;   TONSILLECTOMY      SOCIAL HISTORY: Social History   Socioeconomic History   Marital status: Married    Spouse name: Not on file   Number of children: Not on file   Years of education: Not on file   Highest education level: Not on file  Occupational History   Not on file  Tobacco Use   Smoking status: Former    Current packs/day: 0.00    Average packs/day: 0.5 packs/day for 40.0 years (20.0 ttl pk-yrs)    Types:  Cigarettes    Start date: 10/03/1968    Quit date: 10/03/2008    Years since quitting: 15.1   Smokeless tobacco: Never  Vaping Use   Vaping status: Never Used  Substance and Sexual Activity   Alcohol use: No   Drug use: No   Sexual activity: Not on file  Other Topics Concern   Not on file  Social History Narrative   Not on file   Social Drivers of Health   Financial Resource Strain: Low Risk  (05/12/2023)   Received from [facility], [facility]   Overall Financial Resource Strain (CARDIA)    Difficulty of Paying Living Expenses: Not hard at all  Food Insecurity: No Food Insecurity (05/12/2023)   Received  from [facility], [facility]   Hunger Vital Sign    Worried About Running Out of Food in the Last Year: Never true    Ran Out of Food in the Last Year: Never true  Transportation Needs: No Transportation Needs (05/12/2023)   Received from [facility], [facility]   [facility] - Transportation    In the past 12 months, has lack of transportation kept you from medical appointments or from getting medications?: No    Lack of Transportation (Non-Medical): No  Physical Activity: Not on file  Stress: Not on file  Social Connections: Not on file  Intimate Partner Violence: Not on file    FAMILY HISTORY: Family History  Problem Relation Age of Onset   Rectal cancer Mother 60   Prostate cancer Father        dx. 45s, metastatic   Lymphoma Maternal Uncle    Breast cancer Neg Hx     Review of Systems  Constitutional:  Positive for fatigue. Negative for appetite change, chills, fever and unexpected weight change.  HENT:   Negative for hearing loss, lump/mass and trouble swallowing.   Eyes:  Negative for eye problems and icterus.  Respiratory:  Negative for chest tightness, cough and shortness of breath.   Cardiovascular:  Negative for chest pain, leg swelling and palpitations.  Gastrointestinal:  Negative for abdominal distention, abdominal pain, constipation, diarrhea, nausea and vomiting.  Endocrine: Negative for hot flashes.  Genitourinary:  Negative for difficulty urinating.   Musculoskeletal:  Negative for arthralgias.  Skin:  Negative for itching and rash.  Neurological:  Positive for headaches. Negative for dizziness, extremity weakness and numbness.  Hematological:  Negative for adenopathy. Does not bruise/bleed easily.  Psychiatric/Behavioral:  Negative for depression. The patient is not nervous/anxious.       PHYSICAL EXAMINATION    Vitals:   11/26/23 1013  BP: (!) 144/61  Pulse: 62  Resp: 18  Temp:  (!) 97 F (36.1 C)  SpO2: 99%    Physical Exam Constitutional:      General: She is not in acute distress.    Appearance: Normal appearance. She is not toxic-appearing.  HENT:     Head: Normocephalic and atraumatic.     Mouth/Throat:     Mouth: Mucous membranes are moist.     Pharynx: Oropharynx is clear. No oropharyngeal exudate or posterior oropharyngeal erythema.  Eyes:     General: No scleral icterus. Cardiovascular:     Rate and Rhythm: Normal rate and regular rhythm.     Pulses: Normal pulses.     Heart sounds: Normal heart sounds.  Pulmonary:     Effort: Pulmonary effort is normal.  Breath sounds: Normal breath sounds.  Chest:     Comments: Right breast s/p mastectomy, no sign of local recurrence, left breast benign Abdominal:     General: Abdomen is flat. Bowel sounds are normal. There is no distension.     Palpations: Abdomen is soft.     Tenderness: There is no abdominal tenderness.  Musculoskeletal:        General: No swelling.     Cervical back: Neck supple.  Lymphadenopathy:     Cervical: No cervical adenopathy.  Skin:    General: Skin is warm and dry.     Findings: No rash.  Neurological:     General: No focal deficit present.     Mental Status: She is alert.  Psychiatric:        Mood and Affect: Mood normal.        Behavior: Behavior normal.     LABORATORY DATA:  CBC    Component Value Date/Time   WBC 6.1 04/04/2022 1231   WBC 6.6 12/02/2012 1046   RBC 3.85 (L) 04/04/2022 1231   HGB 12.0 04/04/2022 1231   HGB 9.6 (L) 12/17/2012 0449   HCT 34.7 (L) 04/04/2022 1231   HCT 36.0 12/02/2012 1046   PLT 275 04/04/2022 1231   PLT 244 12/17/2012 0449   MCV 90.1 04/04/2022 1231   MCV 92 12/02/2012 1046   MCH 31.2 04/04/2022 1231   MCHC 34.6 04/04/2022 1231   RDW 13.5 04/04/2022 1231   RDW 14.7 (H) 12/02/2012 1046   LYMPHSABS 1.4 04/04/2022 1231   MONOABS 0.5 04/04/2022 1231   EOSABS 0.0 04/04/2022 1231   BASOSABS 0.0 04/04/2022 1231     CMP     Component Value Date/Time   NA 139 04/20/2022 1111   NA 145 12/17/2012 0449   K 4.5 04/20/2022 1111   K 3.8 12/17/2012 0449   CL 105 04/20/2022 1111   CL 112 (H) 12/17/2012 0449   CO2 29 04/20/2022 1111   CO2 24 12/17/2012 0449   GLUCOSE 94 04/20/2022 1111   GLUCOSE 93 12/17/2012 0449   BUN 19 04/20/2022 1111   BUN 13 12/17/2012 0449   CREATININE 0.89 04/20/2022 1111   CREATININE 1.05 (H) 04/04/2022 1231   CREATININE 0.98 12/17/2012 0449   CALCIUM  9.7 04/20/2022 1111   CALCIUM  7.9 (L) 12/17/2012 0449   PROT 6.2 (L) 04/04/2022 1231   ALBUMIN 4.0 04/04/2022 1231   AST 24 04/04/2022 1231   ALT 18 04/04/2022 1231   ALKPHOS 74 04/04/2022 1231   BILITOT 0.5 04/04/2022 1231   GFRNONAA >60 04/20/2022 1111   GFRNONAA 55 (L) 04/04/2022 1231   GFRNONAA >60 12/17/2012 0449   GFRAA >60 12/17/2012 0449         ASSESSMENT and THERAPY PLAN:   Malignant neoplasm of upper-outer quadrant of right breast in female, estrogen receptor positive (HCC) 04/23/22: Right Mastectomy: Grade 2 ILC 2.1 cm, LG DCIS, ALH, margins Neg, Benign IM LN, 0/5 LN Neg, ER > 90%, PR > 90%, HER2 negative, Ki-67 not done   Treatment Plan:  Adjuvant antiestrogen therapy with letrozole  2.5 mg daily x7 years started 06/02/2022 Lebron was unable to take the letrozole  due to hot flashes.  She developed colitis with anastrozole  and has subsequently developed diarrhea with exemestane  therefore she stopped it.  The only thing that resolved her diarrhea with the anastrozole  was Cipro .  Breast Cancer Patient experienced severe headaches and insomnia while on Letrozole . Previous trials of Anastrozole  and another unspecified aromatase inhibitor resulted  in diarrhea. Discussed the mechanism, potential side effects, and risks of Tamoxifen , a selective estrogen receptor modulator. -Start Tamoxifen  at 10mg  daily, with a plan to increase to 20mg  daily if tolerated. -Continue Wellbutrin  as it does not significantly  interact with Tamoxifen .  Breast Cancer Surveillance Confusion regarding the timing and necessity of breast MRI. The patient has had a previous MRI-guided biopsy. -Order breast MRI and await insurance approval. If not approved, plan for MRI in May.  Lifestyle Modification Discussed the importance of diet and exercise in reducing the risk of breast cancer recurrence. The patient is already making some changes towards a healthier diet. -Encourage continued efforts towards a healthier diet and regular exercise.  Follow-up -Schedule a follow-up appointment with Dr. Gudina in three months to assess tolerance to Tamoxifen  and discuss further management.  All questions were answered. The patient knows to call the clinic with any problems, questions or concerns. We can certainly see the patient much sooner if necessary.  Total encounter time:20 minutes*in face-to-face visit time, chart review, lab review, care coordination, order entry, and documentation of the encounter time.    Morna Kendall, NP 11/26/23 4:03 PM Medical Oncology and Hematology [facility] [address] [place], [place] [address] Tel. [phone]    Fax. [phone]  *Total Encounter Time as defined by the Centers for Medicare and Medicaid Services includes, in addition to the face-to-face time of a patient visit (documented in the note above) non-face-to-face time: obtaining and reviewing outside history, ordering and reviewing medications, tests or procedures, care coordination (communications with other health care professionals or caregivers) and documentation in the medical record.

## 2023-12-28 ENCOUNTER — Ambulatory Visit
Admission: RE | Admit: 2023-12-28 | Discharge: 2023-12-28 | Disposition: A | Discharge status: Home / Self Care | Payer: Medicare Other | Source: Ambulatory Visit | Attending: Adult Health | Admitting: Adult Health

## 2023-12-28 DIAGNOSIS — Z170 Estrogen receptor positive status [ER+]: Secondary | ICD-10-CM

## 2023-12-28 MED ORDER — GADOPICLENOL 0.5 MMOL/ML IV SOLN
8.0000 mL | Freq: Once | INTRAVENOUS | Status: AC | PRN
  Administered 2023-12-28: 8 mL via INTRAVENOUS

## 2023-12-30 ENCOUNTER — Telehealth: Payer: Self-pay

## 2023-12-30 NOTE — Telephone Encounter (Addendum)
 Called per Np with message below. LVM for pt. With results below.----- Message from Conway Dennis sent at 12/30/2023 11:29 AM EST ----- Please share good news with patient htat MRI is negative for any malignancy.  Thanks, LC ----- Message ----- From: Interface, Rad Results In Sent: 12/30/2023  10:01 AM EST To: Percival Brace, NP

## 2024-01-06 ENCOUNTER — Ambulatory Visit: Payer: Medicare HMO | Admitting: Dermatology

## 2024-01-20 ENCOUNTER — Encounter: Payer: Self-pay | Admitting: Dermatology

## 2024-01-20 ENCOUNTER — Other Ambulatory Visit: Payer: Self-pay | Admitting: Dermatology

## 2024-01-20 ENCOUNTER — Ambulatory Visit: Payer: Medicare HMO | Admitting: Dermatology

## 2024-01-20 DIAGNOSIS — L814 Other melanin hyperpigmentation: Secondary | ICD-10-CM

## 2024-01-20 DIAGNOSIS — L820 Inflamed seborrheic keratosis: Secondary | ICD-10-CM

## 2024-01-20 DIAGNOSIS — C4491 Basal cell carcinoma of skin, unspecified: Secondary | ICD-10-CM

## 2024-01-20 DIAGNOSIS — Z1283 Encounter for screening for malignant neoplasm of skin: Secondary | ICD-10-CM

## 2024-01-20 DIAGNOSIS — L578 Other skin changes due to chronic exposure to nonionizing radiation: Secondary | ICD-10-CM | POA: Diagnosis not present

## 2024-01-20 DIAGNOSIS — L570 Actinic keratosis: Secondary | ICD-10-CM | POA: Diagnosis not present

## 2024-01-20 DIAGNOSIS — D229 Melanocytic nevi, unspecified: Secondary | ICD-10-CM

## 2024-01-20 DIAGNOSIS — L905 Scar conditions and fibrosis of skin: Secondary | ICD-10-CM

## 2024-01-20 DIAGNOSIS — D492 Neoplasm of unspecified behavior of bone, soft tissue, and skin: Secondary | ICD-10-CM

## 2024-01-20 DIAGNOSIS — C44319 Basal cell carcinoma of skin of other parts of face: Secondary | ICD-10-CM

## 2024-01-20 DIAGNOSIS — W908XXA Exposure to other nonionizing radiation, initial encounter: Secondary | ICD-10-CM

## 2024-01-20 DIAGNOSIS — L821 Other seborrheic keratosis: Secondary | ICD-10-CM

## 2024-01-20 DIAGNOSIS — D1801 Hemangioma of skin and subcutaneous tissue: Secondary | ICD-10-CM

## 2024-01-20 DIAGNOSIS — Z85828 Personal history of other malignant neoplasm of skin: Secondary | ICD-10-CM

## 2024-01-20 HISTORY — DX: Basal cell carcinoma of skin, unspecified: C44.91

## 2024-01-20 NOTE — Patient Instructions (Addendum)

## 2024-01-20 NOTE — Progress Notes (Signed)
 Follow-Up Visit   Subjective  Katelyn Ball is a 78 y.o. female who presents for the following: Skin Cancer Screening and Full Body Skin Exam, spots at face, two on R hand, and one on chest and mid back that gets irritated. Hx of SCC and SCCIS.  The patient presents for Total-Body Skin Exam (TBSE) for skin cancer screening and mole check. The patient has spots, moles and lesions to be evaluated, some may be new or changing and the patient may have concern these could be cancer.   The following portions of the chart were reviewed this encounter and updated as appropriate: medications, allergies, medical history  Review of Systems:  No other skin or systemic complaints except as noted in HPI or Assessment and Plan.  Objective  Well appearing patient in no apparent distress; mood and affect are within normal limits.  A full examination was performed including scalp, head, eyes, ears, nose, lips, neck, chest, axillae, abdomen, back, buttocks, bilateral upper extremities, bilateral lower extremities, hands, feet, fingers, toes, fingernails, and toenails. All findings within normal limits unless otherwise noted below.   Relevant physical exam findings are noted in the Assessment and Plan.  R dorsal hand x3, chest x2, R cheek x2, L cheek x1 (8) Pink scaly macules Right Eyebrow 11mm pink scaly patch  Right chest x2, R mid back x1 (3) Erythematous keratotic or waxy stuck-on papule or plaque.  Assessment & Plan   SKIN CANCER SCREENING PERFORMED TODAY.  ACTINIC DAMAGE - Chronic condition, secondary to cumulative UV/sun exposure - diffuse scaly erythematous macules with underlying dyspigmentation - Recommend daily broad spectrum sunscreen SPF 30+ to sun-exposed areas, reapply every 2 hours as needed.  - Staying in the shade or wearing long sleeves, sun glasses (UVA+UVB protection) and wide brim hats (4-inch brim around the entire circumference of the hat) are also recommended for sun  protection.  - Call for new or changing lesions.  LENTIGINES, SEBORRHEIC KERATOSES, HEMANGIOMAS - Benign normal skin lesions - Benign-appearing - Call for any changes  MELANOCYTIC NEVI - Tan-brown and/or pink-flesh-colored symmetric macules and papules - Benign appearing on exam today - Observation - Call clinic for new or changing moles - Recommend daily use of broad spectrum spf 30+ sunscreen to sun-exposed areas.   HISTORY OF SQUAMOUS CELL CARCINOMA OF THE SKIN - No evidence of recurrence today - No lymphadenopathy - Recommend regular full body skin exams - Recommend daily broad spectrum sunscreen SPF 30+ to sun-exposed areas, reapply every 2 hours as needed.  - Call if any new or changing lesions are noted between office visits   HISTORY OF SQUAMOUS CELL CARCINOMA IN SITU OF THE SKIN - No evidence of recurrence today - Recommend regular full body skin exams - Recommend daily broad spectrum sunscreen SPF 30+ to sun-exposed areas, reapply every 2 hours as needed.  - Call if any new or changing lesions are noted between office visits  SCAR- Hx SCC treated with [facility] surgery Exam: Dyspigmented smooth macule or patch at L lower distal anterior leg  Benign-appearing.  Observation.  Call clinic for new or changing lesions. Recommend daily broad spectrum sunscreen SPF 30+, reapply every 2 hours as needed.  Treatment: Recommend Serica moisturizing scar formula cream every night or [employer] brand or Mederma silicone scar sheet every night for the first year after a scar appears to help with scar remodeling if desired. Scars remodel on their own for a full year and will gradually improve in appearance over time. Discussed  massaging few times a day to break up scar tissue.    AK (ACTINIC KERATOSIS) (8) R dorsal hand x3, chest x2, R cheek x2, L cheek x1 (8) Actinic keratoses are precancerous spots that appear secondary to cumulative UV radiation exposure/sun exposure over time. They are  chronic with expected duration over 1 year. A portion of actinic keratoses will progress to squamous cell carcinoma of the skin. It is not possible to reliably predict which spots will progress to skin cancer and so treatment is recommended to prevent development of skin cancer.  Recommend daily broad spectrum sunscreen SPF 30+ to sun-exposed areas, reapply every 2 hours as needed.  Recommend staying in the shade or wearing long sleeves, sun glasses (UVA+UVB protection) and wide brim hats (4-inch brim around the entire circumference of the hat). Call for new or changing lesions. Destruction of lesion - R dorsal hand x3, chest x2, R cheek x2, L cheek x1 (8) Complexity: simple   Destruction method: cryotherapy   Informed consent: discussed and consent obtained   Timeout:  patient name, date of birth, surgical site, and procedure verified Lesion destroyed using liquid nitrogen: Yes   Region frozen until ice ball extended beyond lesion: Yes   Cryo cycles: 1 or 2. Outcome: patient tolerated procedure well with no complications   Post-procedure details: wound care instructions given   NEOPLASM OF SKIN Right Eyebrow Skin / nail biopsy Type of biopsy: tangential   Informed consent: discussed and consent obtained   Timeout: patient name, date of birth, surgical site, and procedure verified   Procedure prep:  Patient was prepped and draped in usual sterile fashion Prep type:  Isopropyl alcohol Anesthesia: the lesion was anesthetized in a standard fashion   Anesthetic:  1% lidocaine w/ epinephrine 1-100,000 buffered w/ 8.4% NaHCO3 Instrument used: DermaBlade   Hemostasis achieved with: pressure and aluminum chloride   Outcome: patient tolerated procedure well   Post-procedure details: sterile dressing applied and wound care instructions given   Dressing type: bandage and petrolatum   Specimen 1 - Surgical pathology Differential Diagnosis: AK vs SCC  Check Margins: No 11mm pink scaly  patch SEBORRHEIC KERATOSIS, INFLAMED (3) Right chest x2, R mid back x1 (3) Destruction of lesion - Right chest x2, R mid back x1 (3) Complexity: simple   Destruction method: cryotherapy   Informed consent: discussed and consent obtained   Timeout:  patient name, date of birth, surgical site, and procedure verified Lesion destroyed using liquid nitrogen: Yes   Region frozen until ice ball extended beyond lesion: Yes   Cryo cycles: 1 or 2. Outcome: patient tolerated procedure well with no complications   Post-procedure details: wound care instructions given   MULTIPLE BENIGN NEVI   LENTIGINES   ACTINIC ELASTOSIS   SEBORRHEIC KERATOSES   CHERRY ANGIOMA     Return in about 6 months (around 07/22/2024) for w/ Dr. Katrinka Blazing, Hx SCC, Hx SCCIS.  Wynonia Lawman, CMA, am acting as scribe for Elie Goody, MD .   Documentation: I have reviewed the above documentation for accuracy and completeness, and I agree with the above.  Elie Goody, MD

## 2024-01-21 LAB — DERMATOLOGY PATHOLOGY

## 2024-01-22 ENCOUNTER — Telehealth: Payer: Self-pay

## 2024-01-22 DIAGNOSIS — C44319 Basal cell carcinoma of skin of other parts of face: Secondary | ICD-10-CM

## 2024-01-22 NOTE — Telephone Encounter (Signed)
-----   Message from [place] sent at 01/21/2024  7:25 PM EST ----- Diagnosis: right eyebrow :       BASAL CELL CARCINOMA, SUPERFICIAL AND NODULAR PATTERNS    Please call with diagnosis and determine where the patient would like to have Mohs surgery.  Explanation: your biopsy shows a basal cell skin cancer in the second layer of the skin. This is the most common kind of skin cancer and is caused by damage from sun exposure. Basal cell skin cancers almost never spread beyond the skin, so they are not dangerous to your overall health. However, they will continue to grow, can bleed, cause nonhealing wounds, and disrupt nearby structures unless fully treated.  Treatment: Mohs

## 2024-01-22 NOTE — Telephone Encounter (Signed)
 Discussed with patient Dr. Gwen Pounds reviewed pathology and photos and said if patient did not want mohs he could treat with [facility] and possibly in conjunction with topical 5FU/Calcipotriene treatment after EDC.  Patient asked if [facility] would leave a scar.  Discussed with patient that any procedure can leave a scar and explained the different scars between mohs and EDC.  Patient would like to discuss with her husband and call back with treatment she prefers./sh

## 2024-01-22 NOTE — Telephone Encounter (Signed)
 Advised pt of the bx results and discussed recommended treatment of mohs.  Patient does not want to have mohs due to the location and she thinks it will affect her whole eyebrow.  After discussing with Dr. Katrinka Blazing, he advised he will discuss treatment with Dr. Gwen Pounds.  Advised pt we would call her back after Dr. Katrinka Blazing discusses with Dr. Gladstone Lighter

## 2024-01-23 NOTE — Telephone Encounter (Signed)
 Patient called office and would prefer [facility] referral to Dr. Lorn Junes for CONSULT. She has a lot of questions for Dr. Lorn Junes and does not want to schedule surgery until discussing with him. Referral placed. aw

## 2024-01-23 NOTE — Addendum Note (Signed)
 Addended by: Dorathy Daft R on: 01/23/2024 10:43 AM   Modules accepted: Orders

## 2024-02-10 ENCOUNTER — Ambulatory Visit: Payer: Medicare HMO | Attending: General Surgery

## 2024-02-10 VITALS — Wt 183.1 lb

## 2024-02-10 DIAGNOSIS — Z483 Aftercare following surgery for neoplasm: Secondary | ICD-10-CM | POA: Insufficient documentation

## 2024-02-10 NOTE — Therapy (Signed)
 OUTPATIENT PHYSICAL THERAPY SOZO SCREENING NOTE   Patient Name: Katelyn Ball MRN: [id number] DOB:1946-07-31, 78 y.o., female Today's Date: 02/10/2024  PCP: [name], MD REFERRING PROVIDER: Griselda Miner, MD   PT End of Session - 02/10/24 1008     Visit Number 1   # unchanged due to screen only   PT Start Time 1006    PT Stop Time 1010    PT Time Calculation (min) 4 min    Activity Tolerance Patient tolerated treatment well    Behavior During Therapy WFL for tasks assessed/performed             Past Medical History:  Diagnosis Date   Actinic keratosis    Anemia    Anxiety    Asthma    Atelectasis    B12 deficiency    Basal cell carcinoma 01/20/2024   Right Eyebrow, needs mohs   Cancer (HCC)    skin   COPD (chronic obstructive pulmonary disease) (HCC)    DDD (degenerative disc disease), cervical    Dysrhythmia    GERD (gastroesophageal reflux disease)    History of chicken pox    Hyperlipidemia    Hypertension    Hypothyroidism    Iritis    Meniere disease    PONV (postoperative nausea and vomiting)    Pre-diabetes    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   left upper cutaneous lip   Vitamin D deficiency    Past Surgical History:  Procedure Laterality Date   ABDOMINAL HYSTERECTOMY     BACK SURGERY     BREAST BIOPSY Right 03/26/2022   u/s bx rt 12:00 5cmfn venus clip path pending   BREAST CYST ASPIRATION Left    BREAST CYST ASPIRATION Right  09/25/2017   2 FNA's done on cysts   CHOLECYSTECTOMY     COLONOSCOPY WITH PROPOFOL N/A 10/05/2015   Procedure: COLONOSCOPY WITH PROPOFOL;  Surgeon: Scot Jun, MD;  Location: [facility] ENDOSCOPY;  Service: Endoscopy;  Laterality: N/A;   COLONOSCOPY WITH PROPOFOL N/A 04/22/2023   Procedure: COLONOSCOPY WITH PROPOFOL;  Surgeon: Regis Bill, MD;  Location: [facility] ENDOSCOPY;  Service: Endoscopy;  Laterality: N/A;   ESOPHAGOGASTRODUODENOSCOPY (EGD) WITH PROPOFOL N/A 10/05/2015   Procedure: ESOPHAGOGASTRODUODENOSCOPY (EGD) WITH PROPOFOL;  Surgeon: Scot Jun, MD;  Location: [facility] ENDOSCOPY;  Service: Endoscopy;  Laterality: N/A;   JOINT REPLACEMENT Bilateral 11/2012   LAMINECTOMY AND MICRODISCECTOMY LUMBAR SPINE     MASTECTOMY     MASTECTOMY W/ SENTINEL NODE BIOPSY Right 04/23/2022   Procedure: RIGHT MASTECTOMY;  Surgeon: Griselda Miner, MD;  Location: [facility] OR;  Service: General;  Laterality: Right;   PILONIDAL CYST DRAINAGE     SENTINEL NODE BIOPSY Right 04/23/2022   Procedure: RIGHT SENTINEL NODE BIOPSY;  Surgeon: Griselda Miner, MD;  Location: [facility] OR;  Service: General;  Laterality: Right;   TONSILLECTOMY     Patient Active Problem List   Diagnosis Date Noted   Genetic testing 04/20/2022   Malignant  neoplasm of upper-outer quadrant of right breast in female, estrogen receptor positive (HCC) 04/04/2022   Prediabetes 03/27/2022   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   Vitamin D deficiency 05/24/2021   Vaginal enterocele 05/24/2021   Colonic polyp 05/24/2021   GERD (gastroesophageal reflux disease) 05/24/2021   Chronic obstructive pulmonary disease, unspecified (HCC) 05/24/2021   Allergic rhinitis due to allergen 05/24/2021   Coronary artery calcification seen on CT scan 12/18/2017   Aortic atherosclerosis (HCC) 12/18/2017   Recurrent major depressive disorder, in full remission (HCC) 10/18/2016   Diverticulosis of large intestine without hemorrhage 10/18/2015   Trochanteric  bursitis of right hip 01/25/2015   Lumbar radiculitis 01/25/2015   Pure hypercholesterolemia 11/02/2014   Essential hypertension 11/02/2014   B12 deficiency 09/15/2014    REFERRING DIAG: right breast cancer at risk for lymphedema  THERAPY DIAG:  Aftercare following surgery for neoplasm  PERTINENT HISTORY: Patient was diagnosed on 02/19/2022 with 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 She underwent a right mastectomy and sentinel node biopsy (5 negative nodes removed) on 04/23/2022. It is ER/PR positive and HER2 negative with an unknown Ki67.   PRECAUTIONS: right UE Lymphedema risk  SUBJECTIVE: Pt here for SOZO screen  PAIN:  Are you having pain? No  SOZO SCREENING: Patient was assessed today using the SOZO machine to determine the lymphedema index score. This was compared to her baseline score. It was determined that she is within the recommended range when compared to her baseline and no further action is needed at this time. She will continue SOZO screenings. These are done every 3 months for 2 years post operatively followed by every 6 months for 2 years, and then annually.   L-DEX FLOWSHEETS - 02/10/24 1000       L-DEX LYMPHEDEMA SCREENING   Measurement Type Unilateral    L-DEX MEASUREMENT EXTREMITY Upper Extremity    POSITION  Standing    DOMINANT SIDE Right    At Risk Side Right    BASELINE SCORE (UNILATERAL) -0.3    L-DEX SCORE (UNILATERAL) 5.7    VALUE CHANGE (UNILAT) 6             Berna Spare, PTA 02/10/24 10:11 AM

## 2024-02-25 ENCOUNTER — Ambulatory Visit: Payer: Medicare Other | Admitting: Hematology and Oncology

## 2024-03-03 NOTE — Assessment & Plan Note (Deleted)
 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  She could not tolerate Effexor or Paxil. Letrozole was discontinued due to weight gain and hot flashes   Anastrozole toxicities: (started 03/05/2023) Tolerating it extremely well without any major side effects.  Only 1 hot flash in the last month.    Breast cancer surveillance:  Mammogram left breast 08/15/2023: Stable Breast chest wall exam 03/04/2024: Benign Breast MRI 12/30/2023: Interval decrease in size of biopsy benign mass left central breast.  Otherwise benign density category C  Return to clinic in 1 year for follow-up

## 2024-03-04 ENCOUNTER — Inpatient Hospital Stay: Attending: Adult Health | Admitting: Hematology and Oncology

## 2024-03-04 ENCOUNTER — Other Ambulatory Visit: Payer: Self-pay | Admitting: *Deleted

## 2024-03-04 DIAGNOSIS — C50411 Malignant neoplasm of upper-outer quadrant of right female breast: Secondary | ICD-10-CM | POA: Insufficient documentation

## 2024-03-04 DIAGNOSIS — Z79810 Long term (current) use of selective estrogen receptor modulators (SERMs): Secondary | ICD-10-CM | POA: Insufficient documentation

## 2024-03-04 DIAGNOSIS — Z1732 Human epidermal growth factor receptor 2 negative status: Secondary | ICD-10-CM | POA: Insufficient documentation

## 2024-03-04 DIAGNOSIS — Z9011 Acquired absence of right breast and nipple: Secondary | ICD-10-CM | POA: Insufficient documentation

## 2024-03-04 DIAGNOSIS — Z79899 Other long term (current) drug therapy: Secondary | ICD-10-CM | POA: Insufficient documentation

## 2024-03-04 DIAGNOSIS — Z1721 Progesterone receptor positive status: Secondary | ICD-10-CM | POA: Insufficient documentation

## 2024-03-04 DIAGNOSIS — Z170 Estrogen receptor positive status [ER+]: Secondary | ICD-10-CM | POA: Insufficient documentation

## 2024-03-05 ENCOUNTER — Other Ambulatory Visit: Payer: Self-pay | Admitting: *Deleted

## 2024-03-05 DIAGNOSIS — C50411 Malignant neoplasm of upper-outer quadrant of right female breast: Secondary | ICD-10-CM

## 2024-03-05 MED ORDER — TAMOXIFEN CITRATE 10 MG PO TABS: 10.0000 mg | ORAL_TABLET | Freq: Every day | ORAL | 0 refills | Status: DC

## 2024-03-11 ENCOUNTER — Encounter: Payer: Self-pay | Admitting: *Deleted

## 2024-03-11 ENCOUNTER — Inpatient Hospital Stay: Admitting: Hematology and Oncology

## 2024-03-11 VITALS — BP 130/55 | HR 67 | Temp 97.2°F | Resp 17 | Ht 65.0 in | Wt 182.8 lb

## 2024-03-11 DIAGNOSIS — Z170 Estrogen receptor positive status [ER+]: Secondary | ICD-10-CM | POA: Diagnosis not present

## 2024-03-11 DIAGNOSIS — Z1732 Human epidermal growth factor receptor 2 negative status: Secondary | ICD-10-CM | POA: Diagnosis not present

## 2024-03-11 DIAGNOSIS — Z9011 Acquired absence of right breast and nipple: Secondary | ICD-10-CM | POA: Diagnosis not present

## 2024-03-11 DIAGNOSIS — Z1721 Progesterone receptor positive status: Secondary | ICD-10-CM | POA: Diagnosis not present

## 2024-03-11 DIAGNOSIS — C50411 Malignant neoplasm of upper-outer quadrant of right female breast: Secondary | ICD-10-CM | POA: Diagnosis present

## 2024-03-11 DIAGNOSIS — Z79810 Long term (current) use of selective estrogen receptor modulators (SERMs): Secondary | ICD-10-CM | POA: Diagnosis not present

## 2024-03-11 DIAGNOSIS — Z79899 Other long term (current) drug therapy: Secondary | ICD-10-CM | POA: Diagnosis not present

## 2024-03-11 MED ORDER — TAMOXIFEN CITRATE 10 MG PO TABS
10.0000 mg | ORAL_TABLET | Freq: Every day | ORAL | 3 refills | Status: DC
Start: 2024-03-11 — End: 2024-05-25

## 2024-03-11 NOTE — Progress Notes (Signed)
 RN successfully faxed guardant reveal orders.

## 2024-03-11 NOTE — Progress Notes (Signed)
 Patient Care Team: Melchor Spoon, MD as PCP - General (Internal Medicine) Caralyn Chandler, MD as Consulting Physician (General Surgery) Cameron Cea, MD as Consulting Physician (Hematology and Oncology) Colie Dawes, MD as Attending Physician (Radiation Oncology)  DIAGNOSIS:  Encounter Diagnosis  Name Primary?   Malignant neoplasm of upper-outer quadrant of right breast in female, estrogen receptor positive (HCC) Yes    SUMMARY OF ONCOLOGIC HISTORY: Oncology History  Malignant neoplasm of upper-outer quadrant of right breast in female, estrogen receptor positive (HCC)  03/26/2022 Initial Diagnosis   Screening detected right breast distortion at 12:00: 1.1 cm, axilla negative, ultrasound biopsy: Grade 1 ILC, ER greater than 90%, PR greater than 90%, HER2 negative, Ki-67 not done   04/04/2022 Cancer Staging   Staging form: Breast, AJCC 8th Edition - Clinical: Stage IA (cT1c, cN0, cM0, G1, ER+, PR+, HER2-) - Signed by Cameron Cea, MD on 04/04/2022 Stage prefix: Initial diagnosis Histologic grading system: 3 grade system    Genetic Testing   [facility] CustomNext Panel was Negative. Report date is 04/19/2022.  The CustomNext-Cancer+RNAinsight panel offered by [employer] includes sequencing and rearrangement analysis for the following 47 genes:  APC, ATM, AXIN2, BARD1, BMPR1A, BRCA1, BRCA2, BRIP1, CDH1, CDK4, CDKN2A, CHEK2, CTNNA1, DICER1, EPCAM, GREM1, HOXB13, KIT, MEN1, MLH1, MSH2, MSH3, MSH6, MUTYH, NBN, NF1, NTHL1, PALB2, PDGFRA, PMS2, POLD1, POLE, PTEN, RAD50, RAD51C, RAD51D, SDHA, SDHB, SDHC, SDHD, SMAD4, SMARCA4, STK11, TP53, TSC1, TSC2, and VHL.  RNA data is routinely analyzed for use in variant interpretation for all genes.   04/23/2022 Surgery   Right Mastectomy: Grade 2 ILC 2.1 cm, LG DCIS, ALH, margins Neg, Benign IM LN, 0/5 LN Neg, ER > 90%, PR > 90%, HER2 negative, Ki-67 not done   06/02/2022 -  Anti-estrogen oral therapy   Letrozole  2.5 mg daily x7 years      CHIEF  COMPLIANT: Follow-up on tamoxifen  therapy  HISTORY OF PRESENT ILLNESS:  History of Present Illness Katelyn Ball, a patient with a history of breast cancer, presents for a follow-up visit. She has been on a series of medications for her condition, including letrozole , anastrozole , and tamoxifen . The letrozole  caused hot flashes, while the anastrozole  led to headaches and diarrhea. These side effects were severe enough to cause migraines and prevent her from leaving home. She is currently on a low dose of tamoxifen  (10mg /day), which she tolerates well, experiencing only occasional hot flashes.  In addition to her breast cancer treatment, Katelyn Ball has been dealing with significant personal stress. Her husband was recently diagnosed with liver cancer and underwent surgery to remove part of his liver. This has caused a significant emotional and physical toll on both of them.     ALLERGIES:  is allergic to pneumococcal vaccine, sulfa  antibiotics, anastrozole , ciprofloxacin , cyclobenzaprine, cymbalta [duloxetine hcl], demerol [meperidine], erythromycin, fiorinal [butalbital-aspirin-caffeine], fish oil, motrin [ibuprofen], naproxen, nitrofurantoin, other, cephalexin , and duloxetine.  MEDICATIONS:  Current Outpatient Medications  Medication Sig Dispense Refill   BLACK CURRANT SEED OIL PO Take 1 capsule by mouth in the morning and at bedtime.     buPROPion  (WELLBUTRIN  XL) 300 MG 24 hr tablet Take 1 tablet (300 mg total) by mouth daily. 90 tablet 3   chlorthalidone (HYGROTON) 25 MG tablet Take 1 tablet by mouth daily.     Cholecalciferol 125 MCG (5000 UT) TABS Take 5,000 Units by mouth daily.     clonazePAM  (KLONOPIN ) 1 MG tablet Take 1 mg by mouth at bedtime.     Cyanocobalamin 5000 MCG SUBL  Place 5,000 mcg under the tongue daily.     fenofibrate 160 MG tablet Take 160 mg by mouth at bedtime.     gabapentin  (NEURONTIN ) 100 MG capsule Take 100 mg by mouth at bedtime.     gabapentin  (NEURONTIN ) 400 MG  capsule Take 400 mg by mouth at bedtime.     magnesium oxide (MAG-OX) 400 MG tablet Take by mouth.     metoprolol  succinate (TOPROL -XL) 25 MG 24 hr tablet Take 37.5 mg by mouth daily.     montelukast  (SINGULAIR ) 10 MG tablet Take 10 mg by mouth in the morning.     pantoprazole  (PROTONIX ) 40 MG tablet Take 40 mg by mouth daily.     tamoxifen  (NOLVADEX ) 10 MG tablet Take 1 tablet (10 mg total) by mouth daily. 90 tablet 0   thyroid  (ARMOUR) 30 MG tablet Take 90 mg by mouth daily before breakfast.     triamterene-hydrochlorothiazide (MAXZIDE-25) 37.5-25 MG tablet Take 1 tablet by mouth daily.     No current facility-administered medications for this visit.    PHYSICAL EXAMINATION: ECOG PERFORMANCE STATUS: 1 - Symptomatic but completely ambulatory  Vitals:   03/11/24 1411  BP: (!) 130/55  Pulse: 67  Resp: 17  Temp: (!) 97.2 F (36.2 C)  SpO2: 100%   Filed Weights   03/11/24 1411  Weight: 182 lb 12.8 oz (82.9 kg)     LABORATORY DATA:  I have reviewed the data as listed    Latest Ref Rng & Units 04/20/2022   11:11 AM 04/04/2022   12:31 PM 01/08/2019    2:08 PM  CMP  Glucose 70 - 99 mg/dL 94  742    BUN 8 - 23 mg/dL 19  20    Creatinine 5.95 - 1.00 mg/dL 6.38  7.56  4.33   Sodium 135 - 145 mmol/L 139  130    Potassium 3.5 - 5.1 mmol/L 4.5  4.3    Chloride 98 - 111 mmol/L 105  98    CO2 22 - 32 mmol/L 29  28    Calcium  8.9 - 10.3 mg/dL 9.7  9.2    Total Protein 6.5 - 8.1 g/dL  6.2    Total Bilirubin 0.3 - 1.2 mg/dL  0.5    Alkaline Phos 38 - 126 U/L  74    AST 15 - 41 U/L  24    ALT 0 - 44 U/L  18      Lab Results  Component Value Date   WBC 6.1 04/04/2022   HGB 12.0 04/04/2022   HCT 34.7 (L) 04/04/2022   MCV 90.1 04/04/2022   PLT 275 04/04/2022   NEUTROABS 4.1 04/04/2022    ASSESSMENT & PLAN:  Malignant neoplasm of upper-outer quadrant of right breast in female, estrogen receptor positive (HCC) 04/23/22: Right Mastectomy: Grade 2 ILC 2.1 cm, LG DCIS, ALH, margins  Neg, Benign IM LN, 0/5 LN Neg, ER > 90%, PR > 90%, HER2 negative, Ki-67 not done   Treatment Plan:  Adjuvant antiestrogen therapy with letrozole  2.5 mg daily x7 years started 06/02/2022 switched to anastrozole  due to hot flashes and weight gain and then switched to tamoxifen  due to headaches 11/26/2023   Tamoxifen  toxicities: (started 03/05/2023) Hot flashes: Could not tolerate Effexor    Breast cancer surveillance:  Mammogram 08/15/2023 left breast: Benign Breast chest wall exam 03/11/2024: Benign Breast MRI 12/30/2023: Interval decrease in size of the biopsied benign mass in the left breast 0.4 cm.  Will do breast MRIs every other  year Recommend guardant reveal for MRD testing  Return to clinic in 1 year for follow-up       No orders of the defined types were placed in this encounter.  The patient has a good understanding of the overall plan. she agrees with it. she will call with any problems that may develop before the next visit here. Total time spent: 30 mins including face to face time and time spent for planning, charting and co-ordination of care   Katelyn K Sheria Rosello, MD 03/11/24

## 2024-03-11 NOTE — Assessment & Plan Note (Signed)
 04/23/22: Right Mastectomy: Grade 2 ILC 2.1 cm, LG DCIS, ALH, margins Neg, Benign IM LN, 0/5 LN Neg, ER > 90%, PR > 90%, HER2 negative, Ki-67 not done   Treatment Plan:  Adjuvant antiestrogen therapy with letrozole  2.5 mg daily x7 years started 06/02/2022   History of profound hot flashes: She had these hot flashes even before starting antiestrogen therapy.  She could not tolerate Effexor  or Paxil. Letrozole  was discontinued due to weight gain and hot flashes   Anastrozole  toxicities: (started 03/05/2023) Tolerating it extremely well without any major side effects.  Only 1 hot flash in the last month.     Breast cancer surveillance:  Mammogram 08/15/2023 left breast: Benign Breast chest wall exam 03/11/2024: Benign Breast MRI 12/30/2023: Interval decrease in size of the biopsied benign mass in the left breast 0.4 cm  Return to clinic in 1 year for follow-up

## 2024-03-24 ENCOUNTER — Telehealth: Payer: Self-pay | Admitting: *Deleted

## 2024-03-24 NOTE — Telephone Encounter (Signed)
 Per MD request RN placed call to pt with recent Guardant Reveal results being negative.  Pt educated and verbalized understanding.

## 2024-04-09 ENCOUNTER — Ambulatory Visit: Admitting: Dermatology

## 2024-04-09 ENCOUNTER — Encounter: Payer: Self-pay | Admitting: Dermatology

## 2024-04-09 DIAGNOSIS — Z2239 Carrier of other specified bacterial diseases: Secondary | ICD-10-CM

## 2024-04-09 DIAGNOSIS — L608 Other nail disorders: Secondary | ICD-10-CM | POA: Diagnosis not present

## 2024-04-09 DIAGNOSIS — C44319 Basal cell carcinoma of skin of other parts of face: Secondary | ICD-10-CM | POA: Diagnosis not present

## 2024-04-09 MED ORDER — IMIQUIMOD 5 % EX CREA: TOPICAL_CREAM | CUTANEOUS | 1 refills | Status: DC

## 2024-04-09 NOTE — Patient Instructions (Addendum)
 For toenail at left foot  Recommend vinegar soak 2 to 3 times daily   Use 1 cup of warm water with tablespoon of vinegar. Soak affected toenail 2 to 3 times daily until clear    For cream treatment for right eyebrow  Start imiquimod cream nightly to affected area for 5 nights a week Monday thru Friday and wash off each morning for 6 weeks.   Will recheck next follow up     Recommend daily broad spectrum sunscreen SPF 30+ to sun-exposed areas, reapply every 2 hours as needed. Call for new or changing lesions.  Staying in the shade or wearing long sleeves, sun glasses (UVA+UVB protection) and wide brim hats (4-inch brim around the entire circumference of the hat) are also recommended for sun protection.     Due to recent changes in healthcare laws, you may see results of your pathology and/or laboratory studies on MyChart before the doctors have had a chance to review them. We understand that in some cases there may be results that are confusing or concerning to you. Please understand that not all results are received at the same time and often the doctors may need to interpret multiple results in order to provide you with the best plan of care or course of treatment. Therefore, we ask that you please give us  2 business days to thoroughly review all your results before contacting the office for clarification. Should we see a critical lab result, you will be contacted sooner.   If You Need Anything After Your Visit  If you have any questions or concerns for your doctor, please call our main line at [phone] and press option 4 to reach your doctor's medical assistant. If no one answers, please leave a voicemail as directed and we will return your call as soon as possible. Messages left after 4 pm will be answered the following business day.   You may also send us  a message via MyChart. We typically respond to MyChart messages within 1-2 business days.  For prescription refills, please  ask your pharmacy to contact our office. Our fax number is [phone].  If you have an urgent issue when the clinic is closed that cannot wait until the next business day, you can page your doctor at the number below.    Please note that while we do our best to be available for urgent issues outside of office hours, we are not available 24/7.   If you have an urgent issue and are unable to reach us , you may choose to seek medical care at your doctor's office, retail clinic, urgent care center, or emergency room.  If you have a medical emergency, please immediately call 911 or go to the emergency department.  Pager Numbers  - Dr. Bary Likes: [phone]  - Dr. Annette Barters: [phone]  - Dr. Felipe Horton: [phone]   In the event of inclement weather, please call our main line at [phone] for an update on the status of any delays or closures.  Dermatology Medication Tips: Please keep the boxes that topical medications come in in order to help keep track of the instructions about where and how to use these. Pharmacies typically print the medication instructions only on the boxes and not directly on the medication tubes.   If your medication is too expensive, please contact our office at [phone] option 4 or send us  a message through MyChart.   We are unable to tell what your co-pay for medications will be in advance as this  is different depending on your insurance coverage. However, we may be able to find a substitute medication at lower cost or fill out paperwork to get insurance to cover a needed medication.   If a prior authorization is required to get your medication covered by your insurance company, please allow us  1-2 business days to complete this process.  Drug prices often vary depending on where the prescription is filled and some pharmacies may offer cheaper prices.  The website www.goodrx.com contains coupons for medications through different pharmacies. The prices here do  not account for what the cost may be with help from insurance (it may be cheaper with your insurance), but the website can give you the price if you did not use any insurance.  - You can print the associated coupon and take it with your prescription to the pharmacy.  - You may also stop by our office during regular business hours and pick up a GoodRx coupon card.  - If you need your prescription sent electronically to a different pharmacy, notify our office through [facility] or by phone at [phone] option 4.     Si Usted Necesita Algo Despus de Su Visita  Tambin puede enviarnos un mensaje a travs de Clinical cytogeneticist. Por lo general respondemos a los mensajes de MyChart en el transcurso de 1 a 2 das hbiles.  Para renovar recetas, por favor pida a su farmacia que se ponga en contacto con nuestra oficina. Franz Jacks de fax es [place] [phone].  Si tiene un asunto urgente cuando la clnica est cerrada y que no puede esperar hasta el siguiente da hbil, puede llamar/localizar a su doctor(a) al nmero que aparece a continuacin.   Por favor, tenga en cuenta que aunque hacemos todo lo posible para estar disponibles para asuntos urgentes fuera del horario de [place], no estamos disponibles las 24 horas del da, los 7 [address]  [address] de la [place].   Si tiene un problema urgente y no puede comunicarse con nosotros, puede optar por buscar atencin mdica  en el consultorio de su doctor(a), en una clnica privada, en un centro de atencin urgente o en una sala de emergencias.  Si tiene Engineer, drilling, por favor llame inmediatamente al 911 o vaya a la sala de emergencias.  Nmeros de bper  - Dr. Bary Likes: [phone]  - Dra. Annette Barters: [phone]  - Dr. Felipe Horton: [phone]   En caso de inclemencias del tiempo, por favor llame a Lajuan Pila principal al [phone] para una actualizacin sobre el [place] de cualquier retraso o cierre.  Consejos para la medicacin en  dermatologa: Por favor, guarde las cajas en las que vienen los medicamentos de uso tpico para ayudarle a seguir las instrucciones sobre dnde y cmo usarlos. Las farmacias generalmente imprimen las instrucciones del medicamento slo en las cajas y no directamente en los tubos del [place].   Si su medicamento es muy caro, por favor, pngase en contacto con Bettyjane Brunet llamando al [phone] y presione la opcin 4 o envenos un mensaje a travs de Clinical cytogeneticist.   No podemos decirle cul ser su copago por los medicamentos por adelantado ya que esto es diferente dependiendo de la cobertura de su seguro. Sin embargo, es posible que podamos encontrar un medicamento sustituto a Audiological scientist un formulario para que el seguro cubra el medicamento que se considera necesario.   Si se requiere una autorizacin previa para que su compaa de seguros [place] su medicamento, por favor permtanos de 1 a 2 das hbiles  para Production designer, theatre/television/film.  Los precios de los medicamentos varan con frecuencia dependiendo del Environmental consultant de dnde se surte la receta y alguna farmacias pueden ofrecer precios ms baratos.  El sitio web www.goodrx.com tiene cupones para medicamentos de Health and safety inspector. Los precios aqu no tienen en cuenta lo que podra costar con la ayuda del seguro (puede ser ms barato con su seguro), pero el sitio web puede darle el precio si no utiliz Tourist information centre manager.  - Puede imprimir el cupn correspondiente y llevarlo con su receta a la farmacia.  - Tambin puede pasar por nuestra oficina durante el horario de atencin regular y Education officer, museum una tarjeta de cupones de GoodRx.  - Si necesita que su receta se enve electrnicamente a una farmacia diferente, informe a nuestra oficina a travs de MyChart de [place] o por telfono llamando al [phone] y presione la opcin 4.

## 2024-04-09 NOTE — Progress Notes (Unsigned)
   Follow-Up Visit   Subjective  Katelyn Ball is a 78 y.o. female who presents for the following: noticed a dark streak on toenail 4th left digit last month while getting pedicure. Patient states she went yesterday for pedicure and noticed dark streak is still present.  Patient would also like to discuss other treatment options aside from Mohs to treat bx proven bcc at right eyebrow. Patient states she was scheduled for Mohs surgery with Dr. Robert Chimes but is not wanting to have surgery. She would like to use topical chemo cream instead.    The patient has spots, moles and lesions to be evaluated, some may be new or changing and the patient may have concern these could be cancer.   The following portions of the chart were reviewed this encounter and updated as appropriate: medications, allergies, medical history  Review of Systems:  No other skin or systemic complaints except as noted in HPI or Assessment and Plan.  Objective  Well appearing patient in no apparent distress; mood and affect are within normal limits.    A focused examination was performed of the following areas: Right eyebrow and toenail at left 4th digit   Relevant exam findings are noted in the Assessment and Plan.     Right Eyebrow Red scaly macule at R eyebrow  Assessment & Plan   Concern for nail pseudomonas Exam: Greenish black subungual discoloration of left 4th toenail   Treatment Plan: Ddx includes melanocytic lesion Recommend Vinegar Soaks Use 1 cup of warm water with tablespoon of vinegar. Soak affected toenail 2 to 3 times daily until clear  Will recheck at next follow up.    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 Patient is scheduled but plans to cancel  Discussed Mohs surgery is the best option for high cure rate, checking margins, and cosmetic outcome. Topical therapy does not have margin check and subQ tumour may not be visible on exam  Patient prefers to try topical treatment  Start Imiquimod 5 % cream to affected area topically 5 nights a week (such as M to F) for 6 weeks wash off each morning. Rx sent to [facility]. Patient plans to treat in September and follow up for recheck afterwards  If starts getting too irritated can take 2 nights off   Return for change appt to october .  I, Randee Busing, CMA, am acting as scribe for Harris Liming, MD.   Documentation: I have reviewed the above documentation for accuracy and completeness, and I agree with the above.  Harris Liming, MD

## 2024-05-18 ENCOUNTER — Telehealth: Payer: Self-pay

## 2024-05-18 DIAGNOSIS — C44319 Basal cell carcinoma of skin of other parts of face: Secondary | ICD-10-CM

## 2024-05-18 NOTE — Telephone Encounter (Signed)
 Please advise, patient is referring to imiquimod  5% cream

## 2024-05-18 NOTE — Telephone Encounter (Signed)
 Patient said She left a voicemail last week regarding a RX Dr. Claudene sent in. It's not covered by insurance and She is wanting to see if their is an alternative that He would send in. Best phone number is [phone].

## 2024-05-19 MED ORDER — IMIQUIMOD 5 % EX CREA: TOPICAL_CREAM | CUTANEOUS | 1 refills | Status: AC

## 2024-05-19 NOTE — Telephone Encounter (Signed)
 Spoke with patient and advised her of Dr. Theressa two options regarding prescription. Patient would like rx sent to [facility] on [address] and patient will use goodrx coupon.  Patient voiced understanding to all and will let us  know if she has any issues.

## 2024-05-25 ENCOUNTER — Ambulatory Visit: Attending: General Surgery

## 2024-05-25 ENCOUNTER — Other Ambulatory Visit: Payer: Self-pay | Admitting: *Deleted

## 2024-05-25 DIAGNOSIS — C50411 Malignant neoplasm of upper-outer quadrant of right female breast: Secondary | ICD-10-CM

## 2024-05-25 DIAGNOSIS — Z483 Aftercare following surgery for neoplasm: Secondary | ICD-10-CM | POA: Insufficient documentation

## 2024-05-25 MED ORDER — TAMOXIFEN CITRATE 10 MG PO TABS: 10.0000 mg | ORAL_TABLET | Freq: Every day | ORAL | 3 refills | Status: AC

## 2024-06-29 ENCOUNTER — Ambulatory Visit: Attending: General Surgery

## 2024-06-29 VITALS — Wt 182.2 lb

## 2024-06-29 DIAGNOSIS — Z483 Aftercare following surgery for neoplasm: Secondary | ICD-10-CM | POA: Insufficient documentation

## 2024-06-29 NOTE — Therapy (Signed)
 OUTPATIENT PHYSICAL THERAPY SOZO SCREENING NOTE   Patient Name: Katelyn Ball MRN: [id number] DOB:[birth year], 78 y.o., female Today's Date: 06/29/2024  PCP: Fernande Ophelia JINNY DOUGLAS, MD REFERRING PROVIDER: Curvin Deward DOUGLAS, MD   PT End of Session - 06/29/24 1705     Visit Number 1   # unchanged due to screen only   PT Start Time 1703    PT Stop Time 1707    PT Time Calculation (min) 4 min    Activity Tolerance Patient tolerated treatment well    Behavior During Therapy [facility] for tasks assessed/performed          Past Medical History:  Diagnosis Date   Actinic keratosis    Anemia    Anxiety    Asthma    Atelectasis    B12 deficiency    Basal cell carcinoma 01/20/2024   Right Eyebrow, needs mohs   Cancer (HCC)    skin   COPD (chronic obstructive pulmonary disease) (HCC)    DDD (degenerative disc disease), cervical    Dysrhythmia    GERD (gastroesophageal reflux disease)    History of chicken pox    Hyperlipidemia    Hypertension    Hypothyroidism    Iritis    Meniere disease    PONV (postoperative nausea and vomiting)    Pre-diabetes    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   left upper cutaneous lip   Vitamin D deficiency    Past Surgical History:  Procedure Laterality Date   ABDOMINAL HYSTERECTOMY     BACK SURGERY     BREAST BIOPSY Right 03/26/2022   u/s bx rt 12:00 5cmfn venus clip path pending   BREAST CYST ASPIRATION Left    BREAST CYST ASPIRATION Right 09/25/2017   2  FNA's done on cysts   CHOLECYSTECTOMY     COLONOSCOPY WITH PROPOFOL  N/A 10/05/2015   Procedure: COLONOSCOPY WITH PROPOFOL ;  Surgeon: Lamar ONEIDA Holmes, MD;  Location: [facility] ENDOSCOPY;  Service: Endoscopy;  Laterality: N/A;   COLONOSCOPY WITH PROPOFOL  N/A 04/22/2023   Procedure: COLONOSCOPY WITH PROPOFOL ;  Surgeon: Maryruth Ole ONEIDA, MD;  Location: [facility] ENDOSCOPY;  Service: Endoscopy;  Laterality: N/A;   ESOPHAGOGASTRODUODENOSCOPY (EGD) WITH PROPOFOL  N/A 10/05/2015   Procedure: ESOPHAGOGASTRODUODENOSCOPY (EGD) WITH PROPOFOL ;  Surgeon: Lamar ONEIDA Holmes, MD;  Location: [facility] ENDOSCOPY;  Service: Endoscopy;  Laterality: N/A;   JOINT REPLACEMENT Bilateral 11/2012   LAMINECTOMY AND MICRODISCECTOMY LUMBAR SPINE     MASTECTOMY     MASTECTOMY W/ SENTINEL NODE BIOPSY Right 04/23/2022   Procedure: RIGHT MASTECTOMY;  Surgeon: Curvin Deward DOUGLAS, MD;  Location: [facility] OR;  Service: General;  Laterality: Right;   PILONIDAL CYST DRAINAGE     SENTINEL NODE BIOPSY Right 04/23/2022   Procedure: RIGHT SENTINEL NODE BIOPSY;  Surgeon: Curvin Deward DOUGLAS, MD;  Location: [facility] OR;  Service: General;  Laterality: Right;   TONSILLECTOMY     Patient Active Problem List   Diagnosis Date Noted   Genetic testing 04/20/2022   Malignant neoplasm of upper-outer quadrant  of right breast in female, estrogen receptor positive (HCC) 04/04/2022   Prediabetes 03/27/2022   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   Vitamin D deficiency 05/24/2021   Vaginal enterocele 05/24/2021   Colonic polyp 05/24/2021   GERD (gastroesophageal reflux disease) 05/24/2021   Chronic obstructive pulmonary disease, unspecified (HCC) 05/24/2021   Allergic rhinitis due to allergen 05/24/2021   Coronary artery calcification seen on CT scan 12/18/2017   Aortic atherosclerosis (HCC) 12/18/2017   Recurrent major depressive disorder, in full remission (HCC) 10/18/2016   Diverticulosis of large intestine without hemorrhage 10/18/2015   Trochanteric bursitis of right  hip 01/25/2015   Lumbar radiculitis 01/25/2015   Pure hypercholesterolemia 11/02/2014   Essential hypertension 11/02/2014   B12 deficiency 09/15/2014    REFERRING DIAG: right breast cancer at risk for lymphedema  THERAPY DIAG:  Aftercare following surgery for neoplasm  PERTINENT HISTORY: Patient was diagnosed on 02/19/2022 with 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 She underwent a right mastectomy and sentinel node biopsy (5 negative nodes removed) on 04/23/2022. It is ER/PR positive and HER2 negative with an unknown Ki67.   PRECAUTIONS: right UE Lymphedema risk  SUBJECTIVE: Pt returns for her last 3 month L-Dex.   PAIN:  Are you having pain? No  SOZO SCREENING: Patient was assessed today using the SOZO machine to determine the lymphedema index score. This was compared to her baseline score. It was determined that she is within the recommended range when compared to her baseline and no further action is needed at this time. She will continue SOZO screenings. These are done every 3 months for 2 years post operatively followed by every 6 months for 2 years, and then annually.   L-DEX FLOWSHEETS - 06/29/24 1700       L-DEX LYMPHEDEMA SCREENING   Measurement Type Unilateral    L-DEX MEASUREMENT EXTREMITY Upper Extremity    POSITION  Standing    DOMINANT SIDE Right    At Risk Side Right    BASELINE SCORE (UNILATERAL) -0.3    L-DEX SCORE (UNILATERAL) 4.7    VALUE CHANGE (UNILAT) 5          P: Begin 6 month L-Dex.    Berwyn Knights, PTA 06/29/24 5:06 PM

## 2024-07-16 ENCOUNTER — Ambulatory Visit: Admitting: Dermatology

## 2024-07-16 ENCOUNTER — Encounter: Payer: Self-pay | Admitting: Dermatology

## 2024-07-16 DIAGNOSIS — W908XXA Exposure to other nonionizing radiation, initial encounter: Secondary | ICD-10-CM

## 2024-07-16 DIAGNOSIS — L570 Actinic keratosis: Secondary | ICD-10-CM | POA: Diagnosis not present

## 2024-07-16 DIAGNOSIS — Z08 Encounter for follow-up examination after completed treatment for malignant neoplasm: Secondary | ICD-10-CM | POA: Diagnosis not present

## 2024-07-16 DIAGNOSIS — Z85828 Personal history of other malignant neoplasm of skin: Secondary | ICD-10-CM | POA: Diagnosis not present

## 2024-07-16 NOTE — Progress Notes (Signed)
   Follow-Up Visit   Subjective  Katelyn Ball is a 78 y.o. female who presents for the following: Bx proven BCC R eyebrow, pt used Imiquimod  cr 5d/wk x 6 wks, pt finished 2wks ago, check scaly spot at edge of SCC scar L lat mid forearm, check scaly spots L hand, R han   The following portions of the chart were reviewed this encounter and updated as appropriate: medications, allergies, medical history  Review of Systems:  No other skin or systemic complaints except as noted in HPI or Assessment and Plan.  Objective  Well appearing patient in no apparent distress; mood and affect are within normal limits.   A focused examination was performed of the following areas: face  Relevant exam findings are noted in the Assessment and Plan.  Above the R brow  Left Forearm at end of excision scar x 1, L dorsal hand x 1, R radial wrist x 1 (3) Pink scaly macules  Assessment & Plan   HISTORY OF nodular superficial BASAL CELL CARCINOMA OF THE SKIN - No evidence of recurrence today - Recommend regular full body skin exams - Recommend daily broad spectrum sunscreen SPF 30+ to sun-exposed areas, reapply every 2 hours as needed.  - Call if any new or changing lesions are noted between office visits  - discussed that Mohs is gold standard for treating this kind of skin cancer in this location. Topical therapy may not clear deep component. Must monitor for progression. - R eyebrow pink patch 1.4 x 0.7cm above the R brow, see photo, clear today cont observation  AK (ACTINIC KERATOSIS) (3) Left Forearm at end of excision scar x 1, L dorsal hand x 1, R radial wrist x 1 (3) Actinic keratoses are precancerous spots that appear secondary to cumulative UV radiation exposure/sun exposure over time. They are chronic with expected duration over 1 year. A portion of actinic keratoses will progress to squamous cell carcinoma of the skin. It is not possible to reliably predict which spots will progress to skin  cancer and so treatment is recommended to prevent development of skin cancer.  Recommend daily broad spectrum sunscreen SPF 30+ to sun-exposed areas, reapply every 2 hours as needed.  Recommend staying in the shade or wearing long sleeves, sun glasses (UVA+UVB protection) and wide brim hats (4-inch brim around the entire circumference of the hat). Call for new or changing lesions. Destruction of lesion - Left Forearm at end of excision scar x 1, L dorsal hand x 1, R radial wrist x 1 (3) Complexity: simple   Destruction method: cryotherapy   Informed consent: discussed and consent obtained   Timeout:  patient name, date of birth, surgical site, and procedure verified Lesion destroyed using liquid nitrogen: Yes   Region frozen until ice ball extended beyond lesion: Yes   Cryo cycles: 1 or 2. Outcome: patient tolerated procedure well with no complications   Post-procedure details: wound care instructions given    HISTORY OF BASAL CELL CARCINOMA (BCC)    Return for as scheduled.  I, Grayce Saunas, RMA, am acting as scribe for Boneta Sharps, MD .   Documentation: I have reviewed the above documentation for accuracy and completeness, and I agree with the above.  Boneta Sharps, MD

## 2024-07-16 NOTE — Patient Instructions (Signed)

## 2024-07-27 ENCOUNTER — Ambulatory Visit: Admitting: Dermatology

## 2024-09-08 ENCOUNTER — Ambulatory Visit: Admitting: Dermatology

## 2024-09-15 ENCOUNTER — Encounter: Payer: Self-pay | Admitting: *Deleted

## 2024-09-15 NOTE — Progress Notes (Signed)
 [employer] orders placed via their portal.

## 2024-09-24 ENCOUNTER — Other Ambulatory Visit: Payer: Self-pay | Admitting: Adult Health

## 2024-09-24 DIAGNOSIS — Z1231 Encounter for screening mammogram for malignant neoplasm of breast: Secondary | ICD-10-CM

## 2024-10-01 ENCOUNTER — Ambulatory Visit: Admitting: Dermatology

## 2024-10-07 ENCOUNTER — Encounter: Payer: Self-pay | Admitting: Hematology and Oncology

## 2024-10-19 ENCOUNTER — Ambulatory Visit
Admission: RE | Admit: 2024-10-19 | Discharge: 2024-10-19 | Disposition: A | Source: Ambulatory Visit | Attending: Adult Health | Admitting: Adult Health

## 2024-10-19 DIAGNOSIS — Z1231 Encounter for screening mammogram for malignant neoplasm of breast: Secondary | ICD-10-CM

## 2024-10-23 ENCOUNTER — Other Ambulatory Visit: Payer: Self-pay | Admitting: Adult Health

## 2024-10-23 DIAGNOSIS — R928 Other abnormal and inconclusive findings on diagnostic imaging of breast: Secondary | ICD-10-CM

## 2024-10-28 ENCOUNTER — Inpatient Hospital Stay: Admission: RE | Admit: 2024-10-28 | Discharge: 2024-10-28 | Attending: Adult Health | Admitting: Adult Health

## 2024-10-28 DIAGNOSIS — R928 Other abnormal and inconclusive findings on diagnostic imaging of breast: Secondary | ICD-10-CM

## 2024-12-28 ENCOUNTER — Ambulatory Visit

## 2025-03-15 ENCOUNTER — Ambulatory Visit: Admitting: Hematology and Oncology
